# Patient Record
Sex: Female | Born: 1975 | Race: White | Hispanic: No | Marital: Married | Smoking: Never smoker
Health system: Southern US, Community
[De-identification: ages and names within clinical notes are randomized; demographics above are authoritative.]

## PROBLEM LIST (undated history)

## (undated) DIAGNOSIS — F419 Anxiety disorder, unspecified: Secondary | ICD-10-CM

## (undated) DIAGNOSIS — F32A Depression, unspecified: Secondary | ICD-10-CM

## (undated) DIAGNOSIS — F909 Attention-deficit hyperactivity disorder, unspecified type: Secondary | ICD-10-CM

## (undated) DIAGNOSIS — F329 Major depressive disorder, single episode, unspecified: Secondary | ICD-10-CM

## (undated) DIAGNOSIS — T7840XA Allergy, unspecified, initial encounter: Secondary | ICD-10-CM

## (undated) HISTORY — DX: Depression, unspecified: F32.A

## (undated) HISTORY — PX: BREAST CYST ASPIRATION: SHX578

## (undated) HISTORY — DX: Anxiety disorder, unspecified: F41.9

## (undated) HISTORY — DX: Attention-deficit hyperactivity disorder, unspecified type: F90.9

## (undated) HISTORY — PX: FOOT SURGERY: SHX648

## (undated) HISTORY — DX: Allergy, unspecified, initial encounter: T78.40XA

## (undated) HISTORY — PX: MOUTH SURGERY: SHX715

## (undated) HISTORY — PX: TUBAL LIGATION: SHX77

## (undated) HISTORY — PX: ENDOSCOPIC PLANTAR FASCIOTOMY: SUR443

---
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
## 2007-05-30 ENCOUNTER — Ambulatory Visit: Payer: Self-pay | Admitting: Nurse Practitioner

## 2010-05-02 ENCOUNTER — Ambulatory Visit: Payer: Self-pay | Admitting: Obstetrics and Gynecology

## 2010-05-06 ENCOUNTER — Ambulatory Visit: Payer: Self-pay | Admitting: Obstetrics and Gynecology

## 2010-07-27 ENCOUNTER — Ambulatory Visit: Payer: Self-pay | Admitting: Nurse Practitioner

## 2011-12-05 ENCOUNTER — Encounter: Payer: Self-pay | Admitting: Orthopedic Surgery

## 2011-12-15 ENCOUNTER — Encounter: Payer: Self-pay | Admitting: Orthopedic Surgery

## 2012-01-12 ENCOUNTER — Encounter: Payer: Self-pay | Admitting: Orthopedic Surgery

## 2012-04-16 DIAGNOSIS — Z0000 Encounter for general adult medical examination without abnormal findings: Secondary | ICD-10-CM | POA: Insufficient documentation

## 2012-07-18 ENCOUNTER — Encounter: Payer: Self-pay | Admitting: Internal Medicine

## 2012-07-24 ENCOUNTER — Ambulatory Visit: Payer: Self-pay | Admitting: Internal Medicine

## 2012-08-13 ENCOUNTER — Encounter: Payer: Self-pay | Admitting: Internal Medicine

## 2012-09-13 ENCOUNTER — Encounter: Payer: Self-pay | Admitting: Internal Medicine

## 2014-07-18 DIAGNOSIS — F419 Anxiety disorder, unspecified: Secondary | ICD-10-CM

## 2014-07-18 DIAGNOSIS — F32A Depression, unspecified: Secondary | ICD-10-CM | POA: Insufficient documentation

## 2014-07-18 DIAGNOSIS — F329 Major depressive disorder, single episode, unspecified: Secondary | ICD-10-CM | POA: Insufficient documentation

## 2014-07-18 DIAGNOSIS — K5909 Other constipation: Secondary | ICD-10-CM | POA: Insufficient documentation

## 2014-07-22 DIAGNOSIS — M549 Dorsalgia, unspecified: Secondary | ICD-10-CM | POA: Insufficient documentation

## 2014-07-22 DIAGNOSIS — G4700 Insomnia, unspecified: Secondary | ICD-10-CM | POA: Insufficient documentation

## 2014-08-20 ENCOUNTER — Ambulatory Visit: Payer: Self-pay | Admitting: Internal Medicine

## 2014-08-31 ENCOUNTER — Ambulatory Visit (INDEPENDENT_AMBULATORY_CARE_PROVIDER_SITE_OTHER): Payer: 59

## 2014-08-31 ENCOUNTER — Encounter: Payer: Self-pay | Admitting: Podiatry

## 2014-08-31 ENCOUNTER — Ambulatory Visit (INDEPENDENT_AMBULATORY_CARE_PROVIDER_SITE_OTHER): Payer: 59 | Admitting: Podiatry

## 2014-08-31 VITALS — BP 114/80 | HR 101 | Resp 16 | Ht 64.0 in | Wt 180.0 lb

## 2014-08-31 DIAGNOSIS — J309 Allergic rhinitis, unspecified: Secondary | ICD-10-CM | POA: Insufficient documentation

## 2014-08-31 DIAGNOSIS — F458 Other somatoform disorders: Secondary | ICD-10-CM | POA: Insufficient documentation

## 2014-08-31 DIAGNOSIS — S93602A Unspecified sprain of left foot, initial encounter: Secondary | ICD-10-CM

## 2014-08-31 DIAGNOSIS — G43909 Migraine, unspecified, not intractable, without status migrainosus: Secondary | ICD-10-CM | POA: Insufficient documentation

## 2014-08-31 DIAGNOSIS — G5782 Other specified mononeuropathies of left lower limb: Secondary | ICD-10-CM

## 2014-08-31 DIAGNOSIS — G5762 Lesion of plantar nerve, left lower limb: Secondary | ICD-10-CM

## 2014-08-31 DIAGNOSIS — N942 Vaginismus: Secondary | ICD-10-CM | POA: Insufficient documentation

## 2014-08-31 NOTE — Progress Notes (Signed)
   Subjective:    Patient ID: Sara Suarez, female    DOB: 1976-03-19, 38 y.o.   MRN: [id number]  HPI Comments: The fourth of July was coming down one step and the foot rolled and since has been hurting. The top of the foot hurts. Walking, standing and baring weight hurts. Sharp stabbing pain. Was using ice , sometimes take ibuprofen.   Foot Injury  Associated symptoms include numbness.      Review of Systems  HENT:       Sinus problems   Gastrointestinal: Positive for constipation.  Musculoskeletal:       Muscle pain   Allergic/Immunologic: Positive for environmental allergies and food allergies.  Neurological: Positive for numbness and headaches.  Psychiatric/Behavioral: The patient is nervous/anxious.   All other systems reviewed and are negative.      Objective:   Physical Exam: I have reviewed her past medical history medications allergies surgeries social history assistant. Pulses are strongly palpable bilateral. Neurologic sensorium is intact per since once the monofilament however she does have a palpable Mulder's click to the third interdigital space of the left foot. She also demonstrates a diastases the toe third and fourth digits of the left foot. Radiating pain on palpation the subtalar muscle strength is 5 over 5 dorsiflexors plantar flexors inverters everters all musculature is intact. Tendon reflexes are intact and symmetrical bilateral. Orthopedic evaluation demonstrates a normal osseous architecture bilateral.        Assessment & Plan:  Assessment: Neuroma third interdigital space left foot.  Plan: Discussed etiology pathology conservative versus surgical therapies. Injected the third interdigital space of the left foot today with Kenalog and local anesthetic discussed appropriate shoe gear and no followup with her in one month if necessary.

## 2014-09-01 ENCOUNTER — Ambulatory Visit: Payer: Self-pay | Admitting: Internal Medicine

## 2014-10-12 ENCOUNTER — Ambulatory Visit: Payer: 59 | Admitting: Podiatry

## 2015-03-30 ENCOUNTER — Encounter: Payer: Self-pay | Admitting: Podiatry

## 2015-03-30 ENCOUNTER — Ambulatory Visit (INDEPENDENT_AMBULATORY_CARE_PROVIDER_SITE_OTHER): Payer: 59 | Admitting: Podiatry

## 2015-03-30 VITALS — BP 116/76 | HR 83 | Resp 16

## 2015-03-30 DIAGNOSIS — G5762 Lesion of plantar nerve, left lower limb: Secondary | ICD-10-CM | POA: Diagnosis not present

## 2015-03-30 DIAGNOSIS — G5782 Other specified mononeuropathies of left lower limb: Secondary | ICD-10-CM

## 2015-03-30 NOTE — Progress Notes (Signed)
She presents today with chief complaint of a painful neuroma third interdigital space of her left foot. She states that it's been bothering me for the past few days particularly after dancing for long periods of time in high heels. Otherwise she states that he was doing very good since October of last year.  Objective: Vital signs are stable she is alert and oriented 3. Pulses are strongly palpable left foot. She has a palpable Mulder's click to the third interdigital space of the left foot indicative of neuroma.  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  Plan: Injected the left third interdigital space today with Kenalog and local anesthetic we discussed pros and cons of surgical options. I will follow-up with her as needed.

## 2015-09-13 ENCOUNTER — Ambulatory Visit (INDEPENDENT_AMBULATORY_CARE_PROVIDER_SITE_OTHER): Payer: 59

## 2015-09-13 ENCOUNTER — Encounter: Payer: Self-pay | Admitting: Podiatry

## 2015-09-13 ENCOUNTER — Ambulatory Visit (INDEPENDENT_AMBULATORY_CARE_PROVIDER_SITE_OTHER): Payer: 59 | Admitting: Podiatry

## 2015-09-13 VITALS — BP 107/77 | HR 90 | Resp 16

## 2015-09-13 DIAGNOSIS — G5762 Lesion of plantar nerve, left lower limb: Secondary | ICD-10-CM | POA: Diagnosis not present

## 2015-09-13 DIAGNOSIS — M779 Enthesopathy, unspecified: Secondary | ICD-10-CM

## 2015-09-13 DIAGNOSIS — G5782 Other specified mononeuropathies of left lower limb: Secondary | ICD-10-CM

## 2015-09-13 NOTE — Progress Notes (Signed)
She presents today for follow-up of her neuroma third interdigital space left foot. She states that as of late she has started to develop some sharp stabbing pain on range of motion that is intermittent about the first metatarsophalangeal joint. She denies trauma other than possibly injuring it while dancing. She has redeveloped symptoms to the third interdigital space of the left foot.  Objective: Vital signs are stable she is alert and oriented 3. Pulses are palpable. Neurologic sensorium is intact bilateral with a palpable Mulder's click to the third interdigital space of the left foot. She has tenderness and crepitation on range of motion of the first metatarsophalangeal joint of the right foot. She does however have full range of motion of the first metatarsophalangeal joint. Radiographs demonstrate some joint space narrowing with what also appears to be an older, healed fracture intra-articular base of the proximal phalanx centrally located. Minimally displaced no comminution was present. Cutaneous evaluation is unremarkable.  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 This is possibly associated with an old fracture.  Plan: Discussed etiology pathology conservative versus surgical therapies. I injected her first dose of dehydrated alcohol to the third interdigital space of the left foot. I also injected after sterile Betadine skin prep, 2 mg of dexamethasone with local anesthetic to the first metatarsophalangeal joint of the right foot. She tolerated this procedure well and I will follow-up with her in 3 weeks for her second dose of dehydrated alcohol to the third interdigital space left foot.  Sara Suarez DPM

## 2015-10-13 ENCOUNTER — Ambulatory Visit (INDEPENDENT_AMBULATORY_CARE_PROVIDER_SITE_OTHER): Payer: 59 | Admitting: Podiatry

## 2015-10-13 ENCOUNTER — Encounter: Payer: Self-pay | Admitting: Podiatry

## 2015-10-13 VITALS — Wt 170.6 lb

## 2015-10-13 DIAGNOSIS — G5762 Lesion of plantar nerve, left lower limb: Secondary | ICD-10-CM | POA: Diagnosis not present

## 2015-10-13 DIAGNOSIS — M779 Enthesopathy, unspecified: Secondary | ICD-10-CM | POA: Diagnosis not present

## 2015-10-13 DIAGNOSIS — G5782 Other specified mononeuropathies of left lower limb: Secondary | ICD-10-CM

## 2015-10-13 NOTE — Progress Notes (Signed)
She presents today for follow-up of neuroma to her third interdigital space left foot and capsulitis first metatarsophalangeal joint right foot secondary to an old fracture of the base of the proximal phalanx. She states that the left foot is doing much better however the first metatarsophalangeal joint is not doing a lot better.  Objective: Vital signs stable alert and oriented 3. Pulses are palpable. Minimal tenderness on palpation third interdigital space of the left foot pulses remain palpable bilateral. She has pain on range of motion of the palpation of the first metatarsophalangeal joint right foot.  Assessment: Resolving neuroma third interdigital space left foot capsulitis secondary to trauma right foot.  Plan: Injected Kenalog after sterile Betadine skin prep 20 mg was injected with local anesthetic to the first metatarsophalangeal joint. I will follow-up with her in approximately 6 weeks.

## 2015-11-24 ENCOUNTER — Ambulatory Visit: Payer: 59 | Admitting: Podiatry

## 2015-12-08 ENCOUNTER — Encounter: Payer: Self-pay | Admitting: Podiatry

## 2015-12-08 ENCOUNTER — Ambulatory Visit (INDEPENDENT_AMBULATORY_CARE_PROVIDER_SITE_OTHER): Payer: Managed Care, Other (non HMO) | Admitting: Podiatry

## 2015-12-08 VITALS — BP 118/74 | HR 75 | Resp 16

## 2015-12-08 DIAGNOSIS — G5762 Lesion of plantar nerve, left lower limb: Secondary | ICD-10-CM

## 2015-12-08 DIAGNOSIS — G5782 Other specified mononeuropathies of left lower limb: Secondary | ICD-10-CM

## 2015-12-08 NOTE — Progress Notes (Signed)
She presents today chief complaint continued pain overlying the third interdigital space left foot. She states that I would like to continue my out all injections.  Objective: Low signs are stable she is alert and oriented 3 after reviewing chart is obvious that she following up for neuroma third interdigital space of the left foot. Pulses are strongly palpable. Palpable Mulder's click third interdigital space left with diastases between third and fourth toes.  Assessment: Neuroma third interdigital space left. 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  Plan: His cast etiology pathology conservative versus surgical therapies we discussed surgical therapy regarding the right foot today however I suggested that she follow up with me in the near future for some medical consult regarding the first metatarsophalangeal joint. The left foot was injected today and third interdigital space with her third dose of dehydrated alcohol. Follow-up with her in 3 weeks

## 2016-01-03 ENCOUNTER — Ambulatory Visit (INDEPENDENT_AMBULATORY_CARE_PROVIDER_SITE_OTHER): Payer: Managed Care, Other (non HMO) | Admitting: Podiatry

## 2016-01-03 ENCOUNTER — Encounter: Payer: Self-pay | Admitting: Podiatry

## 2016-01-03 DIAGNOSIS — G5762 Lesion of plantar nerve, left lower limb: Secondary | ICD-10-CM | POA: Diagnosis not present

## 2016-01-03 DIAGNOSIS — M779 Enthesopathy, unspecified: Secondary | ICD-10-CM

## 2016-01-03 DIAGNOSIS — G5782 Other specified mononeuropathies of left lower limb: Secondary | ICD-10-CM

## 2016-01-03 NOTE — Progress Notes (Signed)
She presents today for her fourth alcohol injection to the third interdigital space of the left foot. She states that it was good for a day or 2 and then it started hurting again. She is also concerned about first metatarsophalangeal joint of the right foot.  Objective: Vital signs are stable alert and oriented 3. Pulses are palpable. Palpable Mulder's click third interdigital space of the left foot. Pain on range of motion first metatarsophalangeal joint right foot.  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  Plan: Injected her fourth toes a dehydrated alcohol to the third interdigital space of the left foot today and will follow-up with her in 3 weeks and consider an injection to the contralateral first metatarsophalangeal joint.

## 2016-01-26 ENCOUNTER — Ambulatory Visit (INDEPENDENT_AMBULATORY_CARE_PROVIDER_SITE_OTHER): Payer: Managed Care, Other (non HMO) | Admitting: Podiatry

## 2016-01-26 ENCOUNTER — Encounter: Payer: Self-pay | Admitting: Podiatry

## 2016-01-26 VITALS — BP 106/73 | HR 86 | Resp 16

## 2016-01-26 DIAGNOSIS — M779 Enthesopathy, unspecified: Secondary | ICD-10-CM

## 2016-01-26 DIAGNOSIS — G5762 Lesion of plantar nerve, left lower limb: Secondary | ICD-10-CM

## 2016-01-26 DIAGNOSIS — G5782 Other specified mononeuropathies of left lower limb: Secondary | ICD-10-CM

## 2016-01-26 NOTE — Progress Notes (Signed)
She presents today 3 weeks status post injection fourth dose of dehydrated alcohol third interdigital space of the left foot. She states that it is much improved but is still sore. She continues to dance which aggravates not only her hips but her first metatarsophalangeal joint which we have treated before as well as her neuroma third interdigital space left foot.  Objective: Vital signs are stable she is alert and oriented 3 pulses are palpable. Palpable Mulder's click to the third interdigital space of the left foot. No open lesions or wounds are noted. She has pain on palpation and range of motion of the first metatarsophalangeal joint of the right foot.  Assessment: Capsulitis osteoarthritic changes first metatarsophalangeal joint of the right foot. Also neuroma third interdigital space left foot.  Plan: Injected the fifth dose of dehydrated alcohol third interdigital space left foot. Injected dexamethasone 2 mg and local anesthetic after sterile Betadine skin prep first metatarsophalangeal joint right foot. Follow up with her in 3 weeks.

## 2016-01-27 ENCOUNTER — Telehealth: Payer: Self-pay | Admitting: *Deleted

## 2016-01-27 NOTE — Telephone Encounter (Signed)
"  I need to look at dates to have surgery on my right big toe to have bone spurs taken out of it.  You can reach me on my private line at work.  Work phone is a better way to reach me during the day.

## 2016-01-27 NOTE — Telephone Encounter (Signed)
I'm returning your call.  You want to schedule surgery?  Have you signed consent forms?  "I haven't signed anything yet.  I'm scheduled for a follow-up appointment on April 4th.  I will sign consent forms then.  I spoke with Levada Dy and she added time to my appointment to do this.  I'd like to do it May 26."  I can put you down tentatively for that date.  I don't know what procedures he may be doing.  "I have a bone spur on a toe on one foot and a Neuroma on the other foot that he's been giving me injections in."  I'll put you down tentatively for that date.

## 2016-02-16 ENCOUNTER — Encounter: Payer: Self-pay | Admitting: Podiatry

## 2016-02-16 ENCOUNTER — Ambulatory Visit (INDEPENDENT_AMBULATORY_CARE_PROVIDER_SITE_OTHER): Payer: Managed Care, Other (non HMO) | Admitting: Podiatry

## 2016-02-16 VITALS — BP 107/75 | HR 75 | Resp 16

## 2016-02-16 DIAGNOSIS — M779 Enthesopathy, unspecified: Secondary | ICD-10-CM | POA: Diagnosis not present

## 2016-02-16 DIAGNOSIS — G5782 Other specified mononeuropathies of left lower limb: Secondary | ICD-10-CM

## 2016-02-16 DIAGNOSIS — G5762 Lesion of plantar nerve, left lower limb: Secondary | ICD-10-CM | POA: Diagnosis not present

## 2016-02-16 NOTE — Patient Instructions (Signed)
Pre-Operative Instructions  Congratulations, you have decided to take an important step to improving your quality of life.  You can be assured that the doctors of [facility] will be with you every step of the way.  1. Plan to be at the surgery center/hospital at least 1 (one) hour prior to your scheduled time unless otherwise directed by the surgical center/hospital staff.  You must have a responsible adult accompany you, remain during the surgery and drive you home.  Make sure you have directions to the surgical center/hospital and know how to get there on time. 2. For hospital based surgery you will need to obtain a history and physical form from your family physician within 1 month prior to the date of surgery- we will give you a form for you primary physician.  3. We make every effort to accommodate the date you request for surgery.  There are however, times where surgery dates or times have to be moved.  We will contact you as soon as possible if a change in schedule is required.   4. No Aspirin/Ibuprofen for one week before surgery.  If you are on aspirin, any non-steroidal anti-inflammatory medications (Mobic, Aleve, Ibuprofen) you should stop taking it 7 days prior to your surgery.  You make take Tylenol  For pain prior to surgery.  5. Medications- If you are taking daily heart and blood pressure medications, seizure, reflux, allergy, asthma, anxiety, pain or diabetes medications, make sure the surgery center/hospital is aware before the day of surgery so they may notify you which medications to take or avoid the day of surgery. 6. No food or drink after midnight the night before surgery unless directed otherwise by surgical center/hospital staff. 7. No alcoholic beverages 24 hours prior to surgery.  No smoking 24 hours prior to or 24 hours after surgery. 8. Wear loose pants or shorts- loose enough to fit over bandages, boots, and casts. 9. No slip on shoes, sneakers are best. 10. Bring  your boot with you to the surgery center/hospital.  Also bring crutches or a walker if your physician has prescribed it for you.  If you do not have this equipment, it will be provided for you after surgery. 11. If you have not been contracted by the surgery center/hospital by the day before your surgery, call to confirm the date and time of your surgery. 12. Leave-time from work may vary depending on the type of surgery you have.  Appropriate arrangements should be made prior to surgery with your employer. 13. Prescriptions will be provided immediately following surgery by your doctor.  Have these filled as soon as possible after surgery and take the medication as directed. 14. Remove nail polish on the operative foot. 15. Wash the night before surgery.  The night before surgery wash the foot and leg well with the antibacterial soap provided and water paying special attention to beneath the toenails and in between the toes.  Rinse thoroughly with water and dry well with a towel.  Perform this wash unless told not to do so by your physician.  Enclosed: 1 Ice pack (please put in freezer the night before surgery)   1 Hibiclens skin cleaner   Pre-op Instructions  If you have any questions regarding the instructions, do not hesitate to call our office.  [place]: [address] [place], [place] [address] [phone]  [place]: [address] , [place] [address] [phone]  [place]: [address]  [place], [place] [address] [phone]  Dr. Richard   Tuchman DPM, Dr. Norman Regal DPM Dr. Richard Sikora DPM, Dr. M. Todd Hyatt DPM, Dr. Kathryn Egerton DPM 

## 2016-02-16 NOTE — Progress Notes (Signed)
She presents today for surgical consult regarding her bilateral foot. She states the right foot is her main concern was and ever increasing bunion deformity and cracking and popping and grinding within the first metatarsophalangeal joint where she had broken the proximal phalanx at one point in time. She states it is becoming more and more painful as time goes on and is harder for her to do the things she likes slight dancing and wearing regular shoe gear. She's also here for follow-up of her neuroma third interdigital space of the left foot. She is concerned about surgery regarding this nerve. She would like to consider surgical intervention however she is concerned that recurrence is a great issue. She denies any changes in her past medical history medications allergies surgery social history. At this point she denies cardiac disease hypertension clot development and inability to heal.  Objective: Vital signs are stable she is alert and oriented 3. Pulses are palpable. She has limited range of motion with pain dorsiflexion and plantarflexion of the first metatarsophalangeal joint right foot. Review of radiographs taken in October didn't demonstrate osteoarthritic changes of the first metatarsophalangeal joint consistent with degenerative joint disease. Joint space narrowing some chondral sclerosis etc. She does have neuroma third interspace of the left foot which we have been treating with dehydrated alcohol. It is much less tender today on palpation than previously noted.  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 Neuroma third interspace left foot.  Plan: We discussed the etiology pathology conservative versus surgical therapies today I injected the third interdigital space today with another dose of dehydrated alcohol. We discussed the surgical consent today consisting of a Cleaster Corin Zwick osteotomy with screw fixation. We also discussed  the possibility of a Keller arthroplasty with a single silicone implant. She is all for this if necessary. She's not sure whether or not she will still have a neuroma excision or not at this point we will go ahead and consent her for that and should she request we not do that she can cancel at the time of surgery. She will bring her Cam Walker with her to surgery. We did discuss the possible postoperative medications which may include but are not limited to postop pain bleeding swelling infection recurrence and need for further surgery overcorrection under correction loss of digit loss of limb loss of life. I answered all of these questions to best of my ability in layman's terms and she signed all 3 patient form. We dispensed a pamphlet for brace for specialty surgical Center and the anesthesia group. We will follow up with her in the near future for surgery.

## 2016-02-17 ENCOUNTER — Telehealth: Payer: Self-pay | Admitting: *Deleted

## 2016-02-17 NOTE — Telephone Encounter (Signed)
"  I was given your card yesterday when I signed my consent forms.  Dr. Milinda Pointer said you can schedule me."

## 2016-02-18 NOTE — Telephone Encounter (Signed)
I'm returning your call.  "Yes, I'd like to schedule my surgery.  I saw Dr. Milinda Pointer on Wednesday.  He said to call you to schedule."  When would you like to do it?  "I'd like to do it the Friday before Memorial Day, that way I'll have an extra day that I won't have to ask for time off."  We had you tentatively scheduled for that day.  We'll get you scheduled.  Go ahead and register with the surgical center.  They will call yo a day or two prior to surgery date with the arrival time.  "I glad you mentioned it, I'll get that out of my bag and register."

## 2016-03-20 ENCOUNTER — Ambulatory Visit (INDEPENDENT_AMBULATORY_CARE_PROVIDER_SITE_OTHER): Payer: Managed Care, Other (non HMO) | Admitting: Podiatry

## 2016-03-20 ENCOUNTER — Encounter: Payer: Self-pay | Admitting: Podiatry

## 2016-03-20 VITALS — BP 101/61 | HR 88 | Resp 16

## 2016-03-20 DIAGNOSIS — G5762 Lesion of plantar nerve, left lower limb: Secondary | ICD-10-CM | POA: Diagnosis not present

## 2016-03-20 DIAGNOSIS — G5782 Other specified mononeuropathies of left lower limb: Secondary | ICD-10-CM

## 2016-03-20 DIAGNOSIS — M779 Enthesopathy, unspecified: Secondary | ICD-10-CM

## 2016-03-20 NOTE — Progress Notes (Signed)
She presents today with chief complaint of painful neuroma third interdigital space of her left foot. She would like to have one last alcohol injection to the left foot prior to surgery to make sure that this needs to have surgery performed.  Objective: Vital signs are stable she is alert and oriented 3. Pulses are palpable. Palpable Mulder's click to the third interdigital space of the left foot.  Assessment: Pain in limb secondary to neuroma.  Plan: Injected her third interdigital space left foot with her seventh dose of dehydrated alcohol. Follow-up with her in 2 weeks for surgical intervention if it is still warranted.

## 2016-04-06 ENCOUNTER — Other Ambulatory Visit: Payer: Self-pay | Admitting: Podiatry

## 2016-04-06 MED ORDER — CEPHALEXIN 500 MG PO CAPS: 500.0000 mg | ORAL_CAPSULE | Freq: Three times a day (TID) | ORAL | Status: DC

## 2016-04-06 MED ORDER — OXYCODONE-ACETAMINOPHEN 10-325 MG PO TABS: ORAL_TABLET | ORAL | Status: DC

## 2016-04-06 MED ORDER — ONDANSETRON HCL 4 MG PO TABS: 4.0000 mg | ORAL_TABLET | Freq: Three times a day (TID) | ORAL | Status: DC | PRN

## 2016-04-07 ENCOUNTER — Encounter: Payer: Self-pay | Admitting: Podiatry

## 2016-04-07 DIAGNOSIS — G5760 Lesion of plantar nerve, unspecified lower limb: Secondary | ICD-10-CM | POA: Diagnosis not present

## 2016-04-07 DIAGNOSIS — M2011 Hallux valgus (acquired), right foot: Secondary | ICD-10-CM | POA: Diagnosis not present

## 2016-04-12 ENCOUNTER — Ambulatory Visit (INDEPENDENT_AMBULATORY_CARE_PROVIDER_SITE_OTHER): Payer: Managed Care, Other (non HMO) | Admitting: Podiatry

## 2016-04-12 ENCOUNTER — Ambulatory Visit (INDEPENDENT_AMBULATORY_CARE_PROVIDER_SITE_OTHER): Payer: Managed Care, Other (non HMO)

## 2016-04-12 ENCOUNTER — Encounter: Payer: Self-pay | Admitting: Podiatry

## 2016-04-12 DIAGNOSIS — Z9889 Other specified postprocedural states: Secondary | ICD-10-CM

## 2016-04-12 DIAGNOSIS — M205X1 Other deformities of toe(s) (acquired), right foot: Secondary | ICD-10-CM

## 2016-04-12 DIAGNOSIS — D3610 Benign neoplasm of peripheral nerves and autonomic nervous system, unspecified: Secondary | ICD-10-CM | POA: Diagnosis not present

## 2016-04-12 NOTE — Progress Notes (Signed)
She presents today for follow-up of her first metatarsophalangeal joint and the first metatarsal osteotomy 1 week. He states that it seems to be doing pretty well she ambulates normally today utilizing a cam walker. She denies fever chills nausea vomiting muscle aches and pains denies chest pain shortness of breath. She denies calf pain.  Objective: Vital signs are stable she is alert and oriented 3. Dry sterile dressing was removed demonstrates no erythema is mild edema some ecchymosis no cellulitis drainage or odor. She has good range of motion of the first metatarsophalangeal joint without much tenderness. She has a very nice reduction of the first metatarsal osteotomy shortening in nature with screw fixation right foot. Sutures are intact margins are well coapted.  Assessment: Healing surgical foot right.  Plan: Redressed today dresser compressive dressing encouraged range of motion exercises continue to keep this dry and elevated will follow up with her in 1 week for suture removal.

## 2016-04-19 ENCOUNTER — Encounter: Payer: Self-pay | Admitting: Podiatry

## 2016-04-19 ENCOUNTER — Ambulatory Visit (INDEPENDENT_AMBULATORY_CARE_PROVIDER_SITE_OTHER): Payer: Managed Care, Other (non HMO) | Admitting: Podiatry

## 2016-04-19 DIAGNOSIS — Z9889 Other specified postprocedural states: Secondary | ICD-10-CM

## 2016-04-19 DIAGNOSIS — M205X1 Other deformities of toe(s) (acquired), right foot: Secondary | ICD-10-CM

## 2016-04-19 DIAGNOSIS — D3610 Benign neoplasm of peripheral nerves and autonomic nervous system, unspecified: Secondary | ICD-10-CM | POA: Diagnosis not present

## 2016-04-19 NOTE — Progress Notes (Signed)
She presents today 2 weeks status post [facility] bunion repair right foot. She states that it seems to be tight but other than that is doing well. She states that she had to take the dressing off and she does were regular soccer. She states that the dressing seemed to irritate the wound.  Objective: Vital signs are stable she's alert and oriented 3 there is some edema to the right foot since he has not had compression on it more than likely the sensation that she is currently feeling is due to the swelling. She is good range of motion of the first metatarsophalangeal joint. Sutures are intact as well coapted we removed the sutures today as he no signs of infection along the incision site.  Assessment: Well-healed surgical foot.  Plan: I redressed with a compression anklet today and placed her in a Darco shoe I will follow-up with her in 2 weeks. We will reevaluate the neuroma to the left foot at that time.

## 2016-05-03 ENCOUNTER — Ambulatory Visit (INDEPENDENT_AMBULATORY_CARE_PROVIDER_SITE_OTHER): Payer: Managed Care, Other (non HMO)

## 2016-05-03 ENCOUNTER — Encounter: Payer: Self-pay | Admitting: Podiatry

## 2016-05-03 ENCOUNTER — Ambulatory Visit (INDEPENDENT_AMBULATORY_CARE_PROVIDER_SITE_OTHER): Payer: Managed Care, Other (non HMO) | Admitting: Podiatry

## 2016-05-03 DIAGNOSIS — M205X1 Other deformities of toe(s) (acquired), right foot: Secondary | ICD-10-CM

## 2016-05-03 DIAGNOSIS — Z9889 Other specified postprocedural states: Secondary | ICD-10-CM

## 2016-05-03 DIAGNOSIS — G5782 Other specified mononeuropathies of left lower limb: Secondary | ICD-10-CM

## 2016-05-03 DIAGNOSIS — G5762 Lesion of plantar nerve, left lower limb: Secondary | ICD-10-CM

## 2016-05-03 MED ORDER — NAFTIFINE HCL 2 % EX GEL: 1.0000 "application " | Freq: Two times a day (BID) | CUTANEOUS | Status: DC

## 2016-05-03 NOTE — Progress Notes (Signed)
She presents today one month status post [place] bunion repair with screw fixation right foot. She states that she is doing very well. She states that she would like to have another dehydrated alcohol injection to the third digit left foot. She states that 2 weeks ago she decided to go back to work on her own without requesting a note and her foot was sterilely painful. She states that she remain out of work for the next 2-3 days and allow her foot to recuperate. She states now she has been working all this week and she feels pretty good. She has not been wearing the compression anklet at all times but she has been wearing her Darco shoe.  Objective: Vital signs are stable she is alert and oriented 3. Pulses are palpable. Neurologic sensorium is intact. She has great range of motion of the first metatarsophalangeal joint with very little edema no erythema saline as drainage or odor. The radiographs confirm well-placed I osteotomy and screw fixation. Palpable CLICK third interdigital space left foot. She also has interdigital tinea pedis bilateral.  Assessment: Well-healing Austin bunion repair with screw right foot. Much improved neuroma third interdigital space of the left foot. Tinea pedis.  Plan: Prescribed Naftin gel 2% to be applied twice daily for 2 weeks. I also encouraged her get back into a regular pair of shoes which we discussed in great detail today. I also reinjected her third interdigital space of her left lower dehydrated alcohol no will follow up with her in 1 month. She will call with questions or concerns. I will allow her to go back to tae kwon do with her Cam Gilford Rile just to do upper body work.

## 2016-06-05 ENCOUNTER — Ambulatory Visit (INDEPENDENT_AMBULATORY_CARE_PROVIDER_SITE_OTHER): Payer: Managed Care, Other (non HMO)

## 2016-06-05 ENCOUNTER — Encounter: Payer: Self-pay | Admitting: Podiatry

## 2016-06-05 ENCOUNTER — Ambulatory Visit (INDEPENDENT_AMBULATORY_CARE_PROVIDER_SITE_OTHER): Payer: Managed Care, Other (non HMO) | Admitting: Podiatry

## 2016-06-05 DIAGNOSIS — G5761 Lesion of plantar nerve, right lower limb: Secondary | ICD-10-CM | POA: Diagnosis not present

## 2016-06-05 DIAGNOSIS — Z9889 Other specified postprocedural states: Secondary | ICD-10-CM

## 2016-06-05 DIAGNOSIS — M205X1 Other deformities of toe(s) (acquired), right foot: Secondary | ICD-10-CM

## 2016-06-05 MED ORDER — DICLOFENAC SODIUM 1 % TD GEL: 4.0000 g | Freq: Four times a day (QID) | TRANSDERMAL | 4 refills | Status: DC

## 2016-06-05 NOTE — Progress Notes (Signed)
She presents today for follow-up of her Liane Comber bunion repair right foot date of surgery 04/07/2016 states that is doing just great. She states that her neuroma is doing great as well and less on the contralateral foot.  Objective: Vital signs are stable she is alert and oriented 3 she still has some allodynic type pain to the dorsal aspect of the right foot. Her radiographs confirm well-healing surgical foot right with complete osteotomy intact.  Assessment: Well-healing osteotomy first metatarsal right foot.  Plan: I encouraged her to utilize diclofenac gel which I prescribed for her today. And I will get her back to her regular routine as soon as possible. Follow-up with me in 6 weeks.

## 2016-06-09 ENCOUNTER — Telehealth: Payer: Self-pay | Admitting: *Deleted

## 2016-06-09 NOTE — Telephone Encounter (Addendum)
[employer] RECEIVED DENYING COVERAGE OF DICLOFENAC GEL. 06/13/2016- Dr. Milinda Pointer ordered [place] to [place].

## 2016-06-12 NOTE — Telephone Encounter (Signed)
Yes that will be fine.  Thank you

## 2016-06-13 MED ORDER — NONFORMULARY OR COMPOUNDED ITEM: 11 refills | Status: DC

## 2016-07-10 ENCOUNTER — Ambulatory Visit (INDEPENDENT_AMBULATORY_CARE_PROVIDER_SITE_OTHER): Payer: Managed Care, Other (non HMO)

## 2016-07-10 ENCOUNTER — Encounter: Payer: Self-pay | Admitting: Podiatry

## 2016-07-10 ENCOUNTER — Ambulatory Visit (INDEPENDENT_AMBULATORY_CARE_PROVIDER_SITE_OTHER): Payer: Managed Care, Other (non HMO) | Admitting: Podiatry

## 2016-07-10 DIAGNOSIS — Z9889 Other specified postprocedural states: Secondary | ICD-10-CM

## 2016-07-10 DIAGNOSIS — M205X1 Other deformities of toe(s) (acquired), right foot: Secondary | ICD-10-CM

## 2016-07-10 NOTE — Progress Notes (Signed)
She presents today for her final postop visit date of surgery 04/07/2016 status post [place]tazar Apo with osteotomy right foot. She states that it seems to be doing okay but he just will not bend back as far as I needed to.  Objective: Vital signs are stable she is alert and oriented 3. Pulses are strongly palpable. No erythema and no edema saline as drainage or odor. This foot has gone on to heal 100% with 100% full range of motion with dorsiflexion and plantar flexion. Radiographs demonstrate well-healed osteotomy no signs of broken bones or displacement of internal fixation.  Assessment: Well-healing surgical foot right.  Plan: Follow up with me on an as-needed basis.

## 2016-07-13 NOTE — Progress Notes (Signed)
DOS 04/07/2016 Austin-Youngwick Osteotomy with screw rt foot, Neurectomy 3rd interdigital space left foot.

## 2016-08-01 ENCOUNTER — Other Ambulatory Visit: Payer: Self-pay | Admitting: Internal Medicine

## 2016-08-01 DIAGNOSIS — Z803 Family history of malignant neoplasm of breast: Secondary | ICD-10-CM | POA: Insufficient documentation

## 2016-08-01 DIAGNOSIS — R251 Tremor, unspecified: Secondary | ICD-10-CM | POA: Insufficient documentation

## 2016-08-01 DIAGNOSIS — Z1231 Encounter for screening mammogram for malignant neoplasm of breast: Secondary | ICD-10-CM

## 2016-08-18 ENCOUNTER — Other Ambulatory Visit: Payer: Self-pay | Admitting: Internal Medicine

## 2016-08-18 ENCOUNTER — Ambulatory Visit
Admission: RE | Admit: 2016-08-18 | Discharge: 2016-08-18 | Disposition: A | Discharge status: Home / Self Care | Payer: Managed Care, Other (non HMO) | Source: Ambulatory Visit | Attending: Internal Medicine | Admitting: Internal Medicine

## 2016-08-18 DIAGNOSIS — Z1231 Encounter for screening mammogram for malignant neoplasm of breast: Secondary | ICD-10-CM | POA: Insufficient documentation

## 2017-02-19 ENCOUNTER — Ambulatory Visit: Payer: Managed Care, Other (non HMO) | Admitting: Podiatry

## 2017-02-28 ENCOUNTER — Ambulatory Visit: Payer: Managed Care, Other (non HMO) | Admitting: Podiatry

## 2017-03-05 ENCOUNTER — Ambulatory Visit (INDEPENDENT_AMBULATORY_CARE_PROVIDER_SITE_OTHER): Payer: Managed Care, Other (non HMO) | Admitting: Podiatry

## 2017-03-05 ENCOUNTER — Encounter: Payer: Self-pay | Admitting: Podiatry

## 2017-03-05 DIAGNOSIS — G5761 Lesion of plantar nerve, right lower limb: Secondary | ICD-10-CM | POA: Diagnosis not present

## 2017-03-05 NOTE — Progress Notes (Signed)
She presents today states that they have another neuroma on the right foot this time as she refers to the second interdigital space of the right foot.  Objective: She has pain on palpation second interdigital space right foot. Radiating pain palpable Mulder's click.  Assessment: Neuroma second interdigital space right foot.  Plan: Injected today with first and only dose of cortisone. Follow up with her in 3 weeks if necessary.

## 2017-03-26 ENCOUNTER — Ambulatory Visit: Payer: Managed Care, Other (non HMO) | Admitting: Podiatry

## 2017-08-01 ENCOUNTER — Other Ambulatory Visit: Payer: Self-pay | Admitting: Internal Medicine

## 2017-08-01 DIAGNOSIS — Z803 Family history of malignant neoplasm of breast: Secondary | ICD-10-CM

## 2017-08-01 DIAGNOSIS — J4520 Mild intermittent asthma, uncomplicated: Secondary | ICD-10-CM | POA: Insufficient documentation

## 2017-08-01 DIAGNOSIS — Z1231 Encounter for screening mammogram for malignant neoplasm of breast: Secondary | ICD-10-CM

## 2017-08-22 ENCOUNTER — Ambulatory Visit
Admission: RE | Admit: 2017-08-22 | Discharge: 2017-08-22 | Disposition: A | Discharge status: Home / Self Care | Payer: Managed Care, Other (non HMO) | Source: Ambulatory Visit | Attending: Internal Medicine | Admitting: Internal Medicine

## 2017-08-22 DIAGNOSIS — Z803 Family history of malignant neoplasm of breast: Secondary | ICD-10-CM

## 2017-08-22 DIAGNOSIS — Z1231 Encounter for screening mammogram for malignant neoplasm of breast: Secondary | ICD-10-CM

## 2017-09-18 ENCOUNTER — Ambulatory Visit: Payer: Managed Care, Other (non HMO) | Admitting: Podiatry

## 2017-09-18 ENCOUNTER — Ambulatory Visit: Payer: Managed Care, Other (non HMO)

## 2017-09-18 ENCOUNTER — Ambulatory Visit (INDEPENDENT_AMBULATORY_CARE_PROVIDER_SITE_OTHER): Payer: Managed Care, Other (non HMO)

## 2017-09-18 ENCOUNTER — Encounter: Payer: Self-pay | Admitting: Podiatry

## 2017-09-18 DIAGNOSIS — D3610 Benign neoplasm of peripheral nerves and autonomic nervous system, unspecified: Secondary | ICD-10-CM

## 2017-09-18 DIAGNOSIS — G5761 Lesion of plantar nerve, right lower limb: Secondary | ICD-10-CM

## 2017-09-18 NOTE — Progress Notes (Signed)
She presents today for follow-up of her wart neuroma to her right foot. She states that she's having some pain in her left foot around the knuckles. She still states that she continues type Sara Suarez and she's not sure what is the Effexor and tae kwon do pain wise versus a possible fracture.  Objective: Vital signs are stable alert and oriented 3 no reproducible pain on either foot. Pulses are strongly palpable. Radiographs taken today demonstrate no major osseous abnormalities screw was retained to the head of first metatarsal right foot.  Assessment well-healing neuroma third interspace right foot.  Plan: Follow up with her on an as-needed basis.

## 2017-11-22 DIAGNOSIS — J301 Allergic rhinitis due to pollen: Secondary | ICD-10-CM | POA: Diagnosis not present

## 2017-11-22 DIAGNOSIS — J3081 Allergic rhinitis due to animal (cat) (dog) hair and dander: Secondary | ICD-10-CM | POA: Diagnosis not present

## 2017-11-22 DIAGNOSIS — J3089 Other allergic rhinitis: Secondary | ICD-10-CM | POA: Diagnosis not present

## 2017-11-29 DIAGNOSIS — J3081 Allergic rhinitis due to animal (cat) (dog) hair and dander: Secondary | ICD-10-CM | POA: Diagnosis not present

## 2017-11-29 DIAGNOSIS — J3089 Other allergic rhinitis: Secondary | ICD-10-CM | POA: Diagnosis not present

## 2017-11-29 DIAGNOSIS — J301 Allergic rhinitis due to pollen: Secondary | ICD-10-CM | POA: Diagnosis not present

## 2017-12-06 DIAGNOSIS — J301 Allergic rhinitis due to pollen: Secondary | ICD-10-CM | POA: Diagnosis not present

## 2017-12-06 DIAGNOSIS — J3089 Other allergic rhinitis: Secondary | ICD-10-CM | POA: Diagnosis not present

## 2017-12-06 DIAGNOSIS — J3081 Allergic rhinitis due to animal (cat) (dog) hair and dander: Secondary | ICD-10-CM | POA: Diagnosis not present

## 2017-12-12 DIAGNOSIS — J3089 Other allergic rhinitis: Secondary | ICD-10-CM | POA: Diagnosis not present

## 2017-12-12 DIAGNOSIS — J301 Allergic rhinitis due to pollen: Secondary | ICD-10-CM | POA: Diagnosis not present

## 2017-12-12 DIAGNOSIS — J3081 Allergic rhinitis due to animal (cat) (dog) hair and dander: Secondary | ICD-10-CM | POA: Diagnosis not present

## 2017-12-18 DIAGNOSIS — J301 Allergic rhinitis due to pollen: Secondary | ICD-10-CM | POA: Diagnosis not present

## 2017-12-18 DIAGNOSIS — Z79899 Other long term (current) drug therapy: Secondary | ICD-10-CM | POA: Diagnosis not present

## 2017-12-18 DIAGNOSIS — J3089 Other allergic rhinitis: Secondary | ICD-10-CM | POA: Diagnosis not present

## 2017-12-18 DIAGNOSIS — J3081 Allergic rhinitis due to animal (cat) (dog) hair and dander: Secondary | ICD-10-CM | POA: Diagnosis not present

## 2017-12-28 DIAGNOSIS — J3081 Allergic rhinitis due to animal (cat) (dog) hair and dander: Secondary | ICD-10-CM | POA: Diagnosis not present

## 2017-12-28 DIAGNOSIS — J301 Allergic rhinitis due to pollen: Secondary | ICD-10-CM | POA: Diagnosis not present

## 2017-12-28 DIAGNOSIS — J3089 Other allergic rhinitis: Secondary | ICD-10-CM | POA: Diagnosis not present

## 2018-01-03 DIAGNOSIS — J301 Allergic rhinitis due to pollen: Secondary | ICD-10-CM | POA: Diagnosis not present

## 2018-01-03 DIAGNOSIS — J3089 Other allergic rhinitis: Secondary | ICD-10-CM | POA: Diagnosis not present

## 2018-01-03 DIAGNOSIS — J3081 Allergic rhinitis due to animal (cat) (dog) hair and dander: Secondary | ICD-10-CM | POA: Diagnosis not present

## 2018-01-04 ENCOUNTER — Ambulatory Visit: Payer: Managed Care, Other (non HMO) | Admitting: Podiatry

## 2018-01-04 ENCOUNTER — Ambulatory Visit: Payer: 59 | Admitting: Podiatry

## 2018-01-04 ENCOUNTER — Encounter: Payer: Self-pay | Admitting: Podiatry

## 2018-01-04 DIAGNOSIS — G5761 Lesion of plantar nerve, right lower limb: Secondary | ICD-10-CM

## 2018-01-07 NOTE — Progress Notes (Signed)
   HPI: 42 year old female presenting today for symptoms of a neuroma flare-up of the right foot. She reports associated numbness, tingling and a nodule to the 2nd and 3rd metatarsals of the foot. Walking, standing and bearing weight increase the pain. She has been icing the area and taking Ibuprofen with minimal relief. Patient is here for further evaluation and treatment.   Past Medical History:  Diagnosis Date  . Allergy   . Anxiety      Physical Exam: General: The patient is alert and oriented x3 in no acute distress.  Dermatology: Skin is warm, dry and supple bilateral lower extremities. Negative for open lesions or macerations.  Vascular: Palpable pedal pulses bilaterally. No edema or erythema noted. Capillary refill within normal limits.  Neurological: Epicritic and protective threshold grossly intact bilaterally.   Musculoskeletal Exam: Sharp pain with palpation of the 2nd interspace and lateral compression of the metatarsal heads consistent with neuroma.  Positive Conley Canal sign with loadbearing of the forefoot.  Assessment: 1. 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 H/o multiple foot surgeries bilateral   Plan of Care:  1. Patient was evaluated. 2. Injection of 0.5 mLs Celestone Soluspan injected into the neuroma of the right foot.  3. Continue wearing good shoe gear. 4. Continue taking OTC Motrin.  5. Return to clinic as needed.   Takes Taekwondo   Edrick Kins, DPM [facility]  Dr. Edrick Kins, [address] [place]                                        [place], [place] [address]                Office [phone]  Fax [phone]

## 2018-01-21 DIAGNOSIS — J3089 Other allergic rhinitis: Secondary | ICD-10-CM | POA: Diagnosis not present

## 2018-01-21 DIAGNOSIS — J3081 Allergic rhinitis due to animal (cat) (dog) hair and dander: Secondary | ICD-10-CM | POA: Diagnosis not present

## 2018-01-21 DIAGNOSIS — J301 Allergic rhinitis due to pollen: Secondary | ICD-10-CM | POA: Diagnosis not present

## 2018-01-31 DIAGNOSIS — J301 Allergic rhinitis due to pollen: Secondary | ICD-10-CM | POA: Diagnosis not present

## 2018-01-31 DIAGNOSIS — J3089 Other allergic rhinitis: Secondary | ICD-10-CM | POA: Diagnosis not present

## 2018-01-31 DIAGNOSIS — J3081 Allergic rhinitis due to animal (cat) (dog) hair and dander: Secondary | ICD-10-CM | POA: Diagnosis not present

## 2018-02-07 DIAGNOSIS — J301 Allergic rhinitis due to pollen: Secondary | ICD-10-CM | POA: Diagnosis not present

## 2018-02-07 DIAGNOSIS — J3089 Other allergic rhinitis: Secondary | ICD-10-CM | POA: Diagnosis not present

## 2018-02-07 DIAGNOSIS — J3081 Allergic rhinitis due to animal (cat) (dog) hair and dander: Secondary | ICD-10-CM | POA: Diagnosis not present

## 2018-02-13 DIAGNOSIS — J3089 Other allergic rhinitis: Secondary | ICD-10-CM | POA: Diagnosis not present

## 2018-02-13 DIAGNOSIS — J3081 Allergic rhinitis due to animal (cat) (dog) hair and dander: Secondary | ICD-10-CM | POA: Diagnosis not present

## 2018-02-13 DIAGNOSIS — J301 Allergic rhinitis due to pollen: Secondary | ICD-10-CM | POA: Diagnosis not present

## 2018-02-21 DIAGNOSIS — J301 Allergic rhinitis due to pollen: Secondary | ICD-10-CM | POA: Diagnosis not present

## 2018-02-21 DIAGNOSIS — J3081 Allergic rhinitis due to animal (cat) (dog) hair and dander: Secondary | ICD-10-CM | POA: Diagnosis not present

## 2018-02-21 DIAGNOSIS — J3089 Other allergic rhinitis: Secondary | ICD-10-CM | POA: Diagnosis not present

## 2018-02-26 DIAGNOSIS — J301 Allergic rhinitis due to pollen: Secondary | ICD-10-CM | POA: Diagnosis not present

## 2018-02-26 DIAGNOSIS — J3081 Allergic rhinitis due to animal (cat) (dog) hair and dander: Secondary | ICD-10-CM | POA: Diagnosis not present

## 2018-02-26 DIAGNOSIS — J3089 Other allergic rhinitis: Secondary | ICD-10-CM | POA: Diagnosis not present

## 2018-03-06 ENCOUNTER — Encounter: Payer: Self-pay | Admitting: Podiatry

## 2018-03-06 ENCOUNTER — Ambulatory Visit: Payer: 59 | Admitting: Podiatry

## 2018-03-06 DIAGNOSIS — G5791 Unspecified mononeuropathy of right lower limb: Secondary | ICD-10-CM

## 2018-03-06 DIAGNOSIS — G5761 Lesion of plantar nerve, right lower limb: Secondary | ICD-10-CM | POA: Diagnosis not present

## 2018-03-07 ENCOUNTER — Ambulatory Visit (INDEPENDENT_AMBULATORY_CARE_PROVIDER_SITE_OTHER): Payer: 59 | Admitting: Orthotics

## 2018-03-07 DIAGNOSIS — G5791 Unspecified mononeuropathy of right lower limb: Secondary | ICD-10-CM | POA: Diagnosis not present

## 2018-03-07 DIAGNOSIS — J301 Allergic rhinitis due to pollen: Secondary | ICD-10-CM | POA: Diagnosis not present

## 2018-03-07 DIAGNOSIS — M205X1 Other deformities of toe(s) (acquired), right foot: Secondary | ICD-10-CM | POA: Diagnosis not present

## 2018-03-07 DIAGNOSIS — J3089 Other allergic rhinitis: Secondary | ICD-10-CM | POA: Diagnosis not present

## 2018-03-07 DIAGNOSIS — D3610 Benign neoplasm of peripheral nerves and autonomic nervous system, unspecified: Secondary | ICD-10-CM

## 2018-03-07 DIAGNOSIS — G5761 Lesion of plantar nerve, right lower limb: Secondary | ICD-10-CM

## 2018-03-07 DIAGNOSIS — J3081 Allergic rhinitis due to animal (cat) (dog) hair and dander: Secondary | ICD-10-CM | POA: Diagnosis not present

## 2018-03-07 NOTE — Progress Notes (Signed)
Patient here today for scanning for CMFO per dr. Amalia Hailey.   Patient presents with hx of neuromas and currently being treated for neuroma 3rd/4th interspace RIGHT; also painful keratomas.   Ordering f/o with good arch support and offloading callus area; also w/ a neurma pad.

## 2018-03-08 DIAGNOSIS — J3089 Other allergic rhinitis: Secondary | ICD-10-CM | POA: Diagnosis not present

## 2018-03-10 NOTE — Progress Notes (Signed)
   HPI: 42 year old female presenting today for symptoms of a neuroma flare-up of the right foot. She is requesting an injection and states that has helped alleviate the pain in the past. There are no modifying factors noted. She has not done anything for treatment at home. Patient is here for further evaluation and treatment.   Past Medical History:  Diagnosis Date  . Allergy   . Anxiety      Physical Exam: General: The patient is alert and oriented x3 in no acute distress.  Dermatology: Skin is warm, dry and supple bilateral lower extremities. Negative for open lesions or macerations.  Vascular: Palpable pedal pulses bilaterally. No edema or erythema noted. Capillary refill within normal limits.  Neurological: Epicritic and protective threshold grossly intact bilaterally.   Musculoskeletal Exam: Sharp pain with palpation of the 2nd interspace and lateral compression of the metatarsal heads consistent with neuroma.  Positive Conley Canal sign with loadbearing of the forefoot.  Assessment: 1. 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 H/o multiple foot surgeries bilateral   Plan of Care:  1. Patient was evaluated. 2. Injection of 0.5 mLs Celestone Soluspan injected into the neuroma of the right foot.  3. Appointment with Liliane Channel for custom molded orthotics.  4. Plantar fascial braces dispensed bilaterally.  5. Return to clinic as needed.   Takes Taekwondo   Edrick Kins, DPM [facility]  Dr. Edrick Kins, [address]                                        [place], [place] [address]                Office [phone]  Fax [phone]

## 2018-03-11 DIAGNOSIS — J4520 Mild intermittent asthma, uncomplicated: Secondary | ICD-10-CM | POA: Diagnosis not present

## 2018-03-11 DIAGNOSIS — J301 Allergic rhinitis due to pollen: Secondary | ICD-10-CM | POA: Diagnosis not present

## 2018-03-11 DIAGNOSIS — J3089 Other allergic rhinitis: Secondary | ICD-10-CM | POA: Diagnosis not present

## 2018-03-11 DIAGNOSIS — H1045 Other chronic allergic conjunctivitis: Secondary | ICD-10-CM | POA: Diagnosis not present

## 2018-03-13 DIAGNOSIS — J3089 Other allergic rhinitis: Secondary | ICD-10-CM | POA: Diagnosis not present

## 2018-03-13 DIAGNOSIS — J301 Allergic rhinitis due to pollen: Secondary | ICD-10-CM | POA: Diagnosis not present

## 2018-03-13 DIAGNOSIS — J3081 Allergic rhinitis due to animal (cat) (dog) hair and dander: Secondary | ICD-10-CM | POA: Diagnosis not present

## 2018-03-18 DIAGNOSIS — Z79899 Other long term (current) drug therapy: Secondary | ICD-10-CM | POA: Diagnosis not present

## 2018-03-20 DIAGNOSIS — J3081 Allergic rhinitis due to animal (cat) (dog) hair and dander: Secondary | ICD-10-CM | POA: Diagnosis not present

## 2018-03-20 DIAGNOSIS — J301 Allergic rhinitis due to pollen: Secondary | ICD-10-CM | POA: Diagnosis not present

## 2018-03-22 DIAGNOSIS — J3081 Allergic rhinitis due to animal (cat) (dog) hair and dander: Secondary | ICD-10-CM | POA: Diagnosis not present

## 2018-03-22 DIAGNOSIS — J3089 Other allergic rhinitis: Secondary | ICD-10-CM | POA: Diagnosis not present

## 2018-03-22 DIAGNOSIS — J301 Allergic rhinitis due to pollen: Secondary | ICD-10-CM | POA: Diagnosis not present

## 2018-03-26 DIAGNOSIS — J301 Allergic rhinitis due to pollen: Secondary | ICD-10-CM | POA: Diagnosis not present

## 2018-03-26 DIAGNOSIS — J3081 Allergic rhinitis due to animal (cat) (dog) hair and dander: Secondary | ICD-10-CM | POA: Diagnosis not present

## 2018-03-26 DIAGNOSIS — J3089 Other allergic rhinitis: Secondary | ICD-10-CM | POA: Diagnosis not present

## 2018-03-29 DIAGNOSIS — J3081 Allergic rhinitis due to animal (cat) (dog) hair and dander: Secondary | ICD-10-CM | POA: Diagnosis not present

## 2018-03-29 DIAGNOSIS — J301 Allergic rhinitis due to pollen: Secondary | ICD-10-CM | POA: Diagnosis not present

## 2018-03-29 DIAGNOSIS — J3089 Other allergic rhinitis: Secondary | ICD-10-CM | POA: Diagnosis not present

## 2018-04-02 ENCOUNTER — Telehealth: Payer: Self-pay | Admitting: Podiatry

## 2018-04-02 NOTE — Telephone Encounter (Signed)
Orthotics in..pt to call to schedule an appt to pick them up.Marland KitchenMarland Kitchen

## 2018-04-11 ENCOUNTER — Ambulatory Visit: Payer: 59 | Admitting: Orthotics

## 2018-04-11 DIAGNOSIS — D3610 Benign neoplasm of peripheral nerves and autonomic nervous system, unspecified: Secondary | ICD-10-CM

## 2018-04-11 DIAGNOSIS — G5791 Unspecified mononeuropathy of right lower limb: Secondary | ICD-10-CM

## 2018-04-11 NOTE — Progress Notes (Signed)
Patient came in today to pick up custom made foot orthotics.  The goals were accomplished and the patient reported no dissatisfaction with said orthotics.  Patient was advised of breakin period and how to report any issues.  Topcover NOT glued completely on so patient can fine tune the placement of the neuroma pad.  Patient left w/o paying anything toward 398 (didn't check out)  We need to get this before she leaves when she comes back to get topcover glued.

## 2018-04-15 DIAGNOSIS — J301 Allergic rhinitis due to pollen: Secondary | ICD-10-CM | POA: Diagnosis not present

## 2018-04-15 DIAGNOSIS — J3081 Allergic rhinitis due to animal (cat) (dog) hair and dander: Secondary | ICD-10-CM | POA: Diagnosis not present

## 2018-04-15 DIAGNOSIS — J3089 Other allergic rhinitis: Secondary | ICD-10-CM | POA: Diagnosis not present

## 2018-04-16 DIAGNOSIS — M25531 Pain in right wrist: Secondary | ICD-10-CM | POA: Diagnosis not present

## 2018-04-19 DIAGNOSIS — J3081 Allergic rhinitis due to animal (cat) (dog) hair and dander: Secondary | ICD-10-CM | POA: Diagnosis not present

## 2018-04-19 DIAGNOSIS — J301 Allergic rhinitis due to pollen: Secondary | ICD-10-CM | POA: Diagnosis not present

## 2018-04-19 DIAGNOSIS — J3089 Other allergic rhinitis: Secondary | ICD-10-CM | POA: Diagnosis not present

## 2018-05-02 DIAGNOSIS — J3081 Allergic rhinitis due to animal (cat) (dog) hair and dander: Secondary | ICD-10-CM | POA: Diagnosis not present

## 2018-05-02 DIAGNOSIS — J301 Allergic rhinitis due to pollen: Secondary | ICD-10-CM | POA: Diagnosis not present

## 2018-05-02 DIAGNOSIS — J3089 Other allergic rhinitis: Secondary | ICD-10-CM | POA: Diagnosis not present

## 2018-05-07 DIAGNOSIS — J3089 Other allergic rhinitis: Secondary | ICD-10-CM | POA: Diagnosis not present

## 2018-05-07 DIAGNOSIS — J3081 Allergic rhinitis due to animal (cat) (dog) hair and dander: Secondary | ICD-10-CM | POA: Diagnosis not present

## 2018-05-07 DIAGNOSIS — J301 Allergic rhinitis due to pollen: Secondary | ICD-10-CM | POA: Diagnosis not present

## 2018-05-09 DIAGNOSIS — M25531 Pain in right wrist: Secondary | ICD-10-CM | POA: Diagnosis not present

## 2018-05-10 DIAGNOSIS — J301 Allergic rhinitis due to pollen: Secondary | ICD-10-CM | POA: Diagnosis not present

## 2018-05-10 DIAGNOSIS — J3089 Other allergic rhinitis: Secondary | ICD-10-CM | POA: Diagnosis not present

## 2018-05-10 DIAGNOSIS — J3081 Allergic rhinitis due to animal (cat) (dog) hair and dander: Secondary | ICD-10-CM | POA: Diagnosis not present

## 2018-05-23 DIAGNOSIS — J3089 Other allergic rhinitis: Secondary | ICD-10-CM | POA: Diagnosis not present

## 2018-05-23 DIAGNOSIS — J301 Allergic rhinitis due to pollen: Secondary | ICD-10-CM | POA: Diagnosis not present

## 2018-05-23 DIAGNOSIS — J3081 Allergic rhinitis due to animal (cat) (dog) hair and dander: Secondary | ICD-10-CM | POA: Diagnosis not present

## 2018-05-30 ENCOUNTER — Ambulatory Visit: Payer: 59 | Admitting: Podiatry

## 2018-05-30 ENCOUNTER — Encounter: Payer: Self-pay | Admitting: Podiatry

## 2018-05-30 DIAGNOSIS — G5761 Lesion of plantar nerve, right lower limb: Secondary | ICD-10-CM | POA: Diagnosis not present

## 2018-05-31 DIAGNOSIS — J301 Allergic rhinitis due to pollen: Secondary | ICD-10-CM | POA: Diagnosis not present

## 2018-05-31 DIAGNOSIS — J3081 Allergic rhinitis due to animal (cat) (dog) hair and dander: Secondary | ICD-10-CM | POA: Diagnosis not present

## 2018-05-31 DIAGNOSIS — J3089 Other allergic rhinitis: Secondary | ICD-10-CM | POA: Diagnosis not present

## 2018-06-01 NOTE — Progress Notes (Signed)
She presents today for follow-up neuroma.  She states that is been doing good since April when Dr. Amalia Hailey injected it however is starting to bother me once again.  It seems to be bothering me now in the second metatarsal area.  Objective: Vital signs are stable alert and oriented x3.  Palpable Mulder's click to the second interdigital space of the right foot.  Pulses are palpable.  Assessment: Neuroma right second interdigital space.  Plan: After sterile Betadine skin prep I injected 20 mg Kenalog 5 mg Marcaine to the second interdigital space of the right foot.  Follow-up with her in 1 month

## 2018-06-13 DIAGNOSIS — J3089 Other allergic rhinitis: Secondary | ICD-10-CM | POA: Diagnosis not present

## 2018-06-13 DIAGNOSIS — J301 Allergic rhinitis due to pollen: Secondary | ICD-10-CM | POA: Diagnosis not present

## 2018-06-13 DIAGNOSIS — J3081 Allergic rhinitis due to animal (cat) (dog) hair and dander: Secondary | ICD-10-CM | POA: Diagnosis not present

## 2018-06-20 DIAGNOSIS — J3089 Other allergic rhinitis: Secondary | ICD-10-CM | POA: Diagnosis not present

## 2018-06-20 DIAGNOSIS — Z79899 Other long term (current) drug therapy: Secondary | ICD-10-CM | POA: Diagnosis not present

## 2018-06-20 DIAGNOSIS — J301 Allergic rhinitis due to pollen: Secondary | ICD-10-CM | POA: Diagnosis not present

## 2018-06-20 DIAGNOSIS — J3081 Allergic rhinitis due to animal (cat) (dog) hair and dander: Secondary | ICD-10-CM | POA: Diagnosis not present

## 2018-06-28 DIAGNOSIS — J3081 Allergic rhinitis due to animal (cat) (dog) hair and dander: Secondary | ICD-10-CM | POA: Diagnosis not present

## 2018-06-28 DIAGNOSIS — J3089 Other allergic rhinitis: Secondary | ICD-10-CM | POA: Diagnosis not present

## 2018-06-28 DIAGNOSIS — J301 Allergic rhinitis due to pollen: Secondary | ICD-10-CM | POA: Diagnosis not present

## 2018-07-05 DIAGNOSIS — J301 Allergic rhinitis due to pollen: Secondary | ICD-10-CM | POA: Diagnosis not present

## 2018-07-05 DIAGNOSIS — J3089 Other allergic rhinitis: Secondary | ICD-10-CM | POA: Diagnosis not present

## 2018-07-05 DIAGNOSIS — J3081 Allergic rhinitis due to animal (cat) (dog) hair and dander: Secondary | ICD-10-CM | POA: Diagnosis not present

## 2018-07-09 DIAGNOSIS — J3081 Allergic rhinitis due to animal (cat) (dog) hair and dander: Secondary | ICD-10-CM | POA: Diagnosis not present

## 2018-07-09 DIAGNOSIS — J3089 Other allergic rhinitis: Secondary | ICD-10-CM | POA: Diagnosis not present

## 2018-07-09 DIAGNOSIS — J301 Allergic rhinitis due to pollen: Secondary | ICD-10-CM | POA: Diagnosis not present

## 2018-07-11 ENCOUNTER — Ambulatory Visit: Payer: 59 | Admitting: Podiatry

## 2018-07-11 ENCOUNTER — Encounter: Payer: Self-pay | Admitting: Podiatry

## 2018-07-11 DIAGNOSIS — G5761 Lesion of plantar nerve, right lower limb: Secondary | ICD-10-CM

## 2018-07-11 NOTE — Progress Notes (Signed)
She presents today for follow-up of neuroma second interdigital space right foot.  She states that she is ready for the neural lysis.  Objective: Vital signs are stable alert and oriented x3.  Pulses are palpable.  She has pain on palpation to the second interdigital space with palpable Mulder's click consistent with neuroma.  Her graph assessment: Neuroma second interspace right foot.  Assessment: Neuroma second interspace right.  Plan after sterile Betadine skin prep I injected 2 cc of 4% dehydrated alcohol with local anesthetic to the second interspace of the right foot.  Follow-up with her in 4 weeks

## 2018-07-23 DIAGNOSIS — J3081 Allergic rhinitis due to animal (cat) (dog) hair and dander: Secondary | ICD-10-CM | POA: Diagnosis not present

## 2018-07-23 DIAGNOSIS — J3089 Other allergic rhinitis: Secondary | ICD-10-CM | POA: Diagnosis not present

## 2018-07-23 DIAGNOSIS — J301 Allergic rhinitis due to pollen: Secondary | ICD-10-CM | POA: Diagnosis not present

## 2018-08-06 DIAGNOSIS — J3081 Allergic rhinitis due to animal (cat) (dog) hair and dander: Secondary | ICD-10-CM | POA: Diagnosis not present

## 2018-08-06 DIAGNOSIS — J3089 Other allergic rhinitis: Secondary | ICD-10-CM | POA: Diagnosis not present

## 2018-08-06 DIAGNOSIS — J301 Allergic rhinitis due to pollen: Secondary | ICD-10-CM | POA: Diagnosis not present

## 2018-08-07 DIAGNOSIS — G43909 Migraine, unspecified, not intractable, without status migrainosus: Secondary | ICD-10-CM | POA: Diagnosis not present

## 2018-08-07 DIAGNOSIS — Z0000 Encounter for general adult medical examination without abnormal findings: Secondary | ICD-10-CM | POA: Diagnosis not present

## 2018-08-07 DIAGNOSIS — R6889 Other general symptoms and signs: Secondary | ICD-10-CM | POA: Diagnosis not present

## 2018-08-08 ENCOUNTER — Ambulatory Visit: Payer: 59 | Admitting: Podiatry

## 2018-08-08 ENCOUNTER — Encounter: Payer: Self-pay | Admitting: Podiatry

## 2018-08-08 DIAGNOSIS — G5762 Lesion of plantar nerve, left lower limb: Secondary | ICD-10-CM | POA: Diagnosis not present

## 2018-08-08 NOTE — Progress Notes (Signed)
She presents today for follow-up of her second interdigital space ganglion to her right foot.  States that is been sore recently but it seemed to have started to work for the first few days.  Objective: Vital signs are stable she is alert and oriented x3 still has a palpable Mulder's click to the second interdigital space of the right foot.  Pulses remain palpable.  No erythema edema cellulitis drainage or odor.  Assessment: Neuroma  second interdigital space of the right foot.  Plan: Injected dehydrated alcohol a total of 2 cc after sterile Betadine skin prep to the second interdigital space of the right foot.  Follow-up with her in 3 to 4 weeks.

## 2018-08-16 ENCOUNTER — Other Ambulatory Visit: Payer: Self-pay | Admitting: Internal Medicine

## 2018-08-16 DIAGNOSIS — Z1231 Encounter for screening mammogram for malignant neoplasm of breast: Secondary | ICD-10-CM

## 2018-08-25 DIAGNOSIS — Z23 Encounter for immunization: Secondary | ICD-10-CM | POA: Diagnosis not present

## 2018-09-02 ENCOUNTER — Ambulatory Visit: Payer: 59 | Admitting: Podiatry

## 2018-09-10 ENCOUNTER — Ambulatory Visit
Admission: RE | Admit: 2018-09-10 | Discharge: 2018-09-10 | Disposition: A | Discharge status: Home / Self Care | Payer: 59 | Source: Ambulatory Visit | Attending: Internal Medicine | Admitting: Internal Medicine

## 2018-09-10 DIAGNOSIS — J301 Allergic rhinitis due to pollen: Secondary | ICD-10-CM | POA: Diagnosis not present

## 2018-09-10 DIAGNOSIS — Z1231 Encounter for screening mammogram for malignant neoplasm of breast: Secondary | ICD-10-CM | POA: Insufficient documentation

## 2018-09-10 DIAGNOSIS — J3089 Other allergic rhinitis: Secondary | ICD-10-CM | POA: Diagnosis not present

## 2018-09-10 DIAGNOSIS — J3081 Allergic rhinitis due to animal (cat) (dog) hair and dander: Secondary | ICD-10-CM | POA: Diagnosis not present

## 2018-09-12 ENCOUNTER — Encounter: Payer: Self-pay | Admitting: Podiatry

## 2018-09-12 ENCOUNTER — Ambulatory Visit (INDEPENDENT_AMBULATORY_CARE_PROVIDER_SITE_OTHER): Payer: 59 | Admitting: Podiatry

## 2018-09-12 DIAGNOSIS — G5762 Lesion of plantar nerve, left lower limb: Secondary | ICD-10-CM

## 2018-09-12 NOTE — Progress Notes (Signed)
She presents today for follow-up of neuroma to the second interdigital space of the right foot.  Today will be her fourth injection to the second interdigital space right foot.  Objective: Vital signs are stable alert and oriented x3.  Pulses are palpable.  Palpable Mulder's click second interdigital space right foot with pain.  Assessment: Neuroma slowly resolving right.  Plan: After sterile Betadine skin prep I injected 2 cc of 4% dehydrated alcohol to the second interdigital space of the right foot.  Tolerated procedure well follow-up with her in a couple of 3 weeks.

## 2018-09-16 DIAGNOSIS — J3089 Other allergic rhinitis: Secondary | ICD-10-CM | POA: Diagnosis not present

## 2018-09-16 DIAGNOSIS — J301 Allergic rhinitis due to pollen: Secondary | ICD-10-CM | POA: Diagnosis not present

## 2018-09-16 DIAGNOSIS — J3081 Allergic rhinitis due to animal (cat) (dog) hair and dander: Secondary | ICD-10-CM | POA: Diagnosis not present

## 2018-09-23 DIAGNOSIS — Z79899 Other long term (current) drug therapy: Secondary | ICD-10-CM | POA: Diagnosis not present

## 2018-09-25 DIAGNOSIS — J301 Allergic rhinitis due to pollen: Secondary | ICD-10-CM | POA: Diagnosis not present

## 2018-09-25 DIAGNOSIS — J3081 Allergic rhinitis due to animal (cat) (dog) hair and dander: Secondary | ICD-10-CM | POA: Diagnosis not present

## 2018-09-26 DIAGNOSIS — J3089 Other allergic rhinitis: Secondary | ICD-10-CM | POA: Diagnosis not present

## 2018-10-03 ENCOUNTER — Ambulatory Visit: Payer: 59 | Admitting: Podiatry

## 2018-10-03 ENCOUNTER — Encounter: Payer: Self-pay | Admitting: Podiatry

## 2018-10-03 DIAGNOSIS — G5762 Lesion of plantar nerve, left lower limb: Secondary | ICD-10-CM | POA: Diagnosis not present

## 2018-10-03 DIAGNOSIS — J301 Allergic rhinitis due to pollen: Secondary | ICD-10-CM | POA: Diagnosis not present

## 2018-10-03 DIAGNOSIS — J3081 Allergic rhinitis due to animal (cat) (dog) hair and dander: Secondary | ICD-10-CM | POA: Diagnosis not present

## 2018-10-03 DIAGNOSIS — J3089 Other allergic rhinitis: Secondary | ICD-10-CM | POA: Diagnosis not present

## 2018-10-03 MED ORDER — NONFORMULARY OR COMPOUNDED ITEM: 3 refills | Status: DC

## 2018-10-03 NOTE — Progress Notes (Signed)
She presents today for follow-up of her painful neuroma second interspace of the right foot states that really has not changed much from last visit.  The toes are very sensitive.  Objective: Vital signs stable she is alert and oriented x3 still has pain on palpation to the second interdigital space of the right foot.  The skin is very sensitive over the toes.  Assessment: Chronic neuroma second interspace right.  Allodynic pain.  Plan: At this point we are going to have a compounded agent for pain made and sent to her.  Also went ahead and reinjected the second interdigital space today with 2 cc of 4% dehydrated alcohol and local anesthetic.  Tolerated procedure well without comp occasions.

## 2018-10-24 ENCOUNTER — Encounter: Payer: Self-pay | Admitting: Podiatry

## 2018-10-24 ENCOUNTER — Ambulatory Visit: Payer: 59 | Admitting: Podiatry

## 2018-10-24 DIAGNOSIS — J3081 Allergic rhinitis due to animal (cat) (dog) hair and dander: Secondary | ICD-10-CM | POA: Diagnosis not present

## 2018-10-24 DIAGNOSIS — J3089 Other allergic rhinitis: Secondary | ICD-10-CM | POA: Diagnosis not present

## 2018-10-24 DIAGNOSIS — G5761 Lesion of plantar nerve, right lower limb: Secondary | ICD-10-CM | POA: Diagnosis not present

## 2018-10-24 DIAGNOSIS — J301 Allergic rhinitis due to pollen: Secondary | ICD-10-CM | POA: Diagnosis not present

## 2018-10-24 NOTE — Progress Notes (Signed)
She presents today for follow-up of neuroma second interspace of the right foot states it is better today but is been really painful and aggravated over the past few weeks.  Objective: Vital signs are stable alert and oriented x3 there is no erythema no Celestone drainage has palpable Mulder click second interdigital space of the right foot.  Assessment: Painful and limp secondary to neuroma second interspace of the right foot.  Plan: Extra Betadine skin prep I injected 2 cc 4% dehydrated alcohol with local anesthetic to the second interspace of the right foot this is the fifth injection.  We will follow-up with her in 3 to 4 weeks.

## 2018-10-31 DIAGNOSIS — J3081 Allergic rhinitis due to animal (cat) (dog) hair and dander: Secondary | ICD-10-CM | POA: Diagnosis not present

## 2018-10-31 DIAGNOSIS — J301 Allergic rhinitis due to pollen: Secondary | ICD-10-CM | POA: Diagnosis not present

## 2018-10-31 DIAGNOSIS — J3089 Other allergic rhinitis: Secondary | ICD-10-CM | POA: Diagnosis not present

## 2018-11-21 ENCOUNTER — Ambulatory Visit: Payer: 59 | Admitting: Podiatry

## 2018-11-21 ENCOUNTER — Encounter: Payer: Self-pay | Admitting: Podiatry

## 2018-11-21 DIAGNOSIS — G5761 Lesion of plantar nerve, right lower limb: Secondary | ICD-10-CM

## 2018-11-21 NOTE — Progress Notes (Signed)
She presents today for follow-up of neuroma to the second interdigital space of the right foot.  States that is not as angry as it was last week but is not any better than it was a week for that.  Objective: Vital signs are stable alert and oriented x3 palpable Mulder's click second interspace of the right foot.  Lesser degree of the third interdigital space.  Assessment: Resolving neuroma third interspace.  Plan: Injected her 6 dose of dehydrated alcohol to the second interspace of the right foot.  Tolerated seizure well without complications.  Follow-up with her in 1 month.

## 2018-11-26 DIAGNOSIS — J301 Allergic rhinitis due to pollen: Secondary | ICD-10-CM | POA: Diagnosis not present

## 2018-11-26 DIAGNOSIS — J3089 Other allergic rhinitis: Secondary | ICD-10-CM | POA: Diagnosis not present

## 2018-11-26 DIAGNOSIS — J3081 Allergic rhinitis due to animal (cat) (dog) hair and dander: Secondary | ICD-10-CM | POA: Diagnosis not present

## 2018-12-19 ENCOUNTER — Ambulatory Visit (INDEPENDENT_AMBULATORY_CARE_PROVIDER_SITE_OTHER): Payer: 59

## 2018-12-19 ENCOUNTER — Ambulatory Visit: Payer: 59 | Admitting: Podiatry

## 2018-12-19 ENCOUNTER — Encounter: Payer: Self-pay | Admitting: Podiatry

## 2018-12-19 DIAGNOSIS — S90122A Contusion of left lesser toe(s) without damage to nail, initial encounter: Secondary | ICD-10-CM

## 2018-12-19 DIAGNOSIS — G5761 Lesion of plantar nerve, right lower limb: Secondary | ICD-10-CM | POA: Diagnosis not present

## 2018-12-19 NOTE — Progress Notes (Signed)
She presents today thinks that she may have broken her third and fourth toes on the left foot.  She states that the second interdigital space was doing great for a few days and then it came right back.  Objective: Vital signs are stable she is alert and oriented x3.  Pulses are palpable.  Third and fourth toes of the left foot demonstrates no edema no erythema cellulitis drainage or odor.  Radiographs demonstrate no separation are no acute injuries to the toes.  She does have pain on palpation to the second interspace of the right foot.  Palpable Mulder's click is noted.  Assessment: Chronic neuroma second interspace right foot.  Contusion toes 3 and 4 left.  Plan: At this point after Betadine skin prep I injected 1 cc of 4% dehydrated alcohol to the point of maximal tenderness.  She tolerated procedure well without complications.

## 2019-01-14 ENCOUNTER — Ambulatory Visit: Payer: 59 | Admitting: Podiatry

## 2019-01-14 ENCOUNTER — Encounter: Payer: Self-pay | Admitting: Podiatry

## 2019-01-14 DIAGNOSIS — G5761 Lesion of plantar nerve, right lower limb: Secondary | ICD-10-CM

## 2019-01-14 NOTE — Progress Notes (Signed)
She presents today for follow-up of her neuroma second interspace of the right foot.  States that is just not doing as well as I had hoped it was very angry after the first week he was doing fine and then it just went crazy after that.  Objective: Vital signs are stable alert and oriented x3 pain on palpation to the second interdigital space of the right foot.  Assessment: Neuroma second interspace right foot.  Plan: Formed her eighth injection today dehydrated alcohol 2 cc 4% dehydrated alcohol and local anesthetic.  Tolerated procedure well we did discuss the possible need for neurectomy she understands this and is amenable to it.

## 2019-01-20 ENCOUNTER — Telehealth: Payer: Self-pay | Admitting: Podiatry

## 2019-01-20 NOTE — Telephone Encounter (Signed)
Left message informing pt that on occasion with the neurolysis, there may be a flare of the symptoms, rest, ice 3-4 times daily for 15-20 minutes protecting the skin with a light towel and use the pain cream prescribed by Dr. Milinda Pointer, and call me to discuss.

## 2019-01-20 NOTE — Telephone Encounter (Signed)
I just had my eighth neuroma injection last Tuesday. Since I can hardly walk from the pain. Please call me on my direct work number [phone] and you can leave a message if I don't answer.

## 2019-01-20 NOTE — Telephone Encounter (Signed)
Pt called and states she has been in misery the whole weekend, Dr. Milinda Pointer had said there should be an improvement after the 7th and 8th injections, but this was worse. I instructed pt to use ice therapy, antiinflammatory cream and stiff bottom shoe. Pt states she has a walking boot and can use that, but is ready for an alternate therapy. I offered to have pt come in to discuss other therapies to include possible surgery. Pt states she has an appt for 02/04/2019 for the 9th injection and will wait until then.

## 2019-01-21 NOTE — Telephone Encounter (Signed)
If she needs a narcotic we can do that as well.

## 2019-01-21 NOTE — Telephone Encounter (Signed)
I called pt for status and she states the foot does not like ice. I offered narcotic for bedtime and pt refused states she is wearing the boot and going to tough it our until her next appt. Pt states she may just say no to the 9th shot and go straight to the surgery scheduling. I told pt I would inform Dr. Milinda Pointer.

## 2019-01-22 ENCOUNTER — Telehealth: Payer: Self-pay | Admitting: *Deleted

## 2019-01-22 NOTE — Telephone Encounter (Signed)
I called pt and told her I would send to schedulers for an appt tomorrow and then to surgery coordinator to check availability for surgery next week.

## 2019-01-22 NOTE — Telephone Encounter (Signed)
Pt states she is done and ready to schedule the nerve removal surgery next week, and can come in tomorrow to sign consent forms.

## 2019-01-23 ENCOUNTER — Ambulatory Visit: Payer: 59 | Admitting: Podiatry

## 2019-01-23 ENCOUNTER — Encounter: Payer: Self-pay | Admitting: Podiatry

## 2019-01-23 ENCOUNTER — Other Ambulatory Visit: Payer: Self-pay

## 2019-01-23 DIAGNOSIS — G5761 Lesion of plantar nerve, right lower limb: Secondary | ICD-10-CM

## 2019-01-23 NOTE — Progress Notes (Signed)
She presents today chief complaint of a painful neuroma second interdigital space of the right foot.  States that the last injections have failed to relieve any of her symptoms.  At this point she would like to consider surgical intervention.  She denies any changes in her past medical history medications allergies surgery social history.  Objective: Vital signs are stable alert and oriented x3.  Palpable Mulder's click second interdigital space of the right foot with pain on palpation of this area.  Assessment: Neuroma second interspace right.  Plan: Discussed etiology pathology conservative surgical therapies at this point in time we did discuss in great detail excision of this neuroma.  She understands that she is going have numbness and tingling between the toes for the rest of her life she understands and is amenable to it.  We discussed the pros and cons of surgery discussed the possible complications which may include but not limited to postop pain bleeding swelling infection recurrence loss of digit loss of sensation loss of life.  She understands and is amenable to it.  She will bring her own cam walker with her that day we will do this under general anesthesia with a nerve block.

## 2019-01-23 NOTE — Patient Instructions (Signed)
Pre-Operative Instructions  Congratulations, you have decided to take an important step towards improving your quality of life.  You can be assured that the doctors and staff at [facility] will be with you every step of the way.  Here are some important things you should know:  1. Plan to be at the surgery center/hospital at least 1 (one) hour prior to your scheduled time, unless otherwise directed by the surgical center/hospital staff.  You must have a responsible adult accompany you, remain during the surgery and drive you home.  Make sure you have directions to the surgical center/hospital to ensure you arrive on time. 2. If you are having surgery at [facility], you will need a copy of your medical history and physical form from your family physician within one month prior to the date of surgery. We will give you a form for your primary physician to complete.  3. We make every effort to accommodate the date you request for surgery.  However, there are times where surgery dates or times have to be moved.  We will contact you as soon as possible if a change in schedule is required.   4. No aspirin/ibuprofen for one week before surgery.  If you are on aspirin, any non-steroidal anti-inflammatory medications (Mobic, Aleve, Ibuprofen) should not be taken seven (7) days prior to your surgery.  You make take Tylenol for pain prior to surgery.  5. Medications - If you are taking daily heart and blood pressure medications, seizure, reflux, allergy, asthma, anxiety, pain or diabetes medications, make sure you notify the surgery center/hospital before the day of surgery so they can tell you which medications you should take or avoid the day of surgery. 6. No food or drink after midnight the night before surgery unless directed otherwise by surgical center/hospital staff. 7. No alcoholic beverages 24-hours prior to surgery.  No smoking 24-hours prior or 24-hours after  surgery. 8. Wear loose pants or shorts. They should be loose enough to fit over bandages, boots, and casts. 9. Don't wear slip-on shoes. Sneakers are preferred. 10. Bring your boot with you to the surgery center/hospital.  Also bring crutches or a walker if your physician has prescribed it for you.  If you do not have this equipment, it will be provided for you after surgery. 11. If you have not been contacted by the surgery center/hospital by the day before your surgery, call to confirm the date and time of your surgery. 12. Leave-time from work may vary depending on the type of surgery you have.  Appropriate arrangements should be made prior to surgery with your employer. 13. Prescriptions will be provided immediately following surgery by your doctor.  Fill these as soon as possible after surgery and take the medication as directed. Pain medications will not be refilled on weekends and must be approved by the doctor. 14. Remove nail polish on the operative foot and avoid getting pedicures prior to surgery. 15. Wash the night before surgery.  The night before surgery wash the foot and leg well with water and the antibacterial soap provided. Be sure to pay special attention to beneath the toenails and in between the toes.  Wash for at least three (3) minutes. Rinse thoroughly with water and dry well with a towel.  Perform this wash unless told not to do so by your physician.  Enclosed: 1 Ice pack (please put in freezer the night before surgery)   1 Hibiclens skin cleaner     Pre-op instructions  If you have any questions regarding the instructions, please do not hesitate to call our office.  [place]: [address], [place], [place] [address] -- [phone]  [place]: [address] [place], [place] [address] -- [phone]  [place]: [address]  [place], [place] [address] -- [phone]  [place]: [address], [address], [place], [place] [address] -- [phone]  Website:  [email or web address] 

## 2019-01-27 ENCOUNTER — Telehealth: Payer: Self-pay | Admitting: *Deleted

## 2019-01-27 NOTE — Telephone Encounter (Signed)
Pt called states she is scheduled for surgery and due to the current national health problem, will her surgery be cancelled.

## 2019-01-27 NOTE — Telephone Encounter (Signed)
I'm returning your call.  You are still scheduled for surgery at this time.  If I hear anything different, I'll let you know.  "I know it will me sometime in the morning and someone will call me a day or two before and give me my time.  Is that correct?"  Yes, that is correct.  You will get a call on Wednesday or Thursday.

## 2019-01-29 ENCOUNTER — Other Ambulatory Visit: Payer: Self-pay | Admitting: Podiatry

## 2019-01-29 MED ORDER — ONDANSETRON HCL 4 MG PO TABS: 4.0000 mg | ORAL_TABLET | Freq: Three times a day (TID) | ORAL | 0 refills | Status: DC | PRN

## 2019-01-29 MED ORDER — HYDROMORPHONE HCL 4 MG PO TABS: 4.0000 mg | ORAL_TABLET | Freq: Four times a day (QID) | ORAL | 0 refills | Status: AC | PRN

## 2019-01-29 MED ORDER — CEPHALEXIN 500 MG PO CAPS: 500.0000 mg | ORAL_CAPSULE | Freq: Three times a day (TID) | ORAL | 0 refills | Status: DC

## 2019-02-03 ENCOUNTER — Telehealth: Payer: Self-pay | Admitting: *Deleted

## 2019-02-03 NOTE — Telephone Encounter (Signed)
I was scheduled to have surgery on Friday and it got canceled.  I received a reminder for tomorrow about a follow-up appointment.  Of course I know I don't need it.  Will you let me know when I am able to reschedule my surgery?"  I can reschedule you now.  Dr. Milinda Pointer can do it on April 24.  "Okay, that works for me.  I normally work in [place] but when [employer] not working, I prefer my appointments to be at [facility] location because I live in [place]."  I will get your appointments rescheduled.

## 2019-02-04 ENCOUNTER — Ambulatory Visit: Payer: 59 | Admitting: Podiatry

## 2019-02-05 ENCOUNTER — Other Ambulatory Visit: Payer: 59

## 2019-02-20 ENCOUNTER — Telehealth: Payer: Self-pay | Admitting: *Deleted

## 2019-02-20 NOTE — Telephone Encounter (Signed)
I called [facility] and asked Renee if we could leave Sara Suarez's surgery scheduled for March 07, 2019.  She said that would be fine.  She said Dr. Milinda Pointer can start at his normal time of 7 am.

## 2019-02-25 ENCOUNTER — Other Ambulatory Visit: Payer: 59

## 2019-02-25 NOTE — Telephone Encounter (Signed)
ok 

## 2019-03-06 ENCOUNTER — Other Ambulatory Visit: Payer: Self-pay | Admitting: Podiatry

## 2019-03-06 MED ORDER — HYDROMORPHONE HCL 4 MG PO TABS: 4.0000 mg | ORAL_TABLET | Freq: Four times a day (QID) | ORAL | 0 refills | Status: AC | PRN

## 2019-03-06 MED ORDER — CEPHALEXIN 500 MG PO CAPS: 500.0000 mg | ORAL_CAPSULE | Freq: Three times a day (TID) | ORAL | 0 refills | Status: DC

## 2019-03-06 MED ORDER — ONDANSETRON HCL 4 MG PO TABS: 4.0000 mg | ORAL_TABLET | Freq: Three times a day (TID) | ORAL | 0 refills | Status: DC | PRN

## 2019-03-07 ENCOUNTER — Encounter: Payer: Self-pay | Admitting: Podiatry

## 2019-03-07 DIAGNOSIS — G5761 Lesion of plantar nerve, right lower limb: Secondary | ICD-10-CM

## 2019-03-12 ENCOUNTER — Encounter: Payer: Self-pay | Admitting: Podiatry

## 2019-03-12 ENCOUNTER — Ambulatory Visit (INDEPENDENT_AMBULATORY_CARE_PROVIDER_SITE_OTHER): Payer: 59 | Admitting: Podiatry

## 2019-03-12 ENCOUNTER — Other Ambulatory Visit: Payer: Self-pay

## 2019-03-12 ENCOUNTER — Other Ambulatory Visit: Payer: 59

## 2019-03-12 ENCOUNTER — Encounter: Payer: 59 | Admitting: Podiatry

## 2019-03-12 VITALS — BP 131/91 | HR 80 | Temp 96.9°F

## 2019-03-12 DIAGNOSIS — G5761 Lesion of plantar nerve, right lower limb: Secondary | ICD-10-CM

## 2019-03-12 DIAGNOSIS — Z9889 Other specified postprocedural states: Secondary | ICD-10-CM

## 2019-03-12 NOTE — Progress Notes (Signed)
She presents today for her first postop visit date of surgery March 07, 2019 neurectomy second interdigital space right foot.  States that is doing okay pain is manageable with ibuprofen took my first pain pill yesterday.  Denies fever chills nausea vomiting muscle aches and pains.  Objective: Presents today with a cam walker.  Dressed her dressing intact was removed demonstrates no erythema cellulitis drainage or odor.  Assessment: Well-healing surgical foot.  Plan: Follow-up with her in 1 week.  Sutures may need to remain in a little longer this because of the area.

## 2019-03-13 ENCOUNTER — Telehealth: Payer: Self-pay | Admitting: *Deleted

## 2019-03-13 ENCOUNTER — Other Ambulatory Visit: Payer: 59

## 2019-03-13 NOTE — Telephone Encounter (Signed)
-----   Message from Garrel Ridgel, [place] sent at 03/12/2019  4:16 PM EDT ----- Confirmed neuroma.

## 2019-03-13 NOTE — Telephone Encounter (Signed)
Pt seen in office yesterday.

## 2019-03-19 ENCOUNTER — Encounter: Payer: Self-pay | Admitting: Podiatry

## 2019-03-19 ENCOUNTER — Ambulatory Visit (INDEPENDENT_AMBULATORY_CARE_PROVIDER_SITE_OTHER): Payer: 59 | Admitting: Podiatry

## 2019-03-19 ENCOUNTER — Other Ambulatory Visit: Payer: Self-pay

## 2019-03-19 VITALS — Temp 98.4°F

## 2019-03-19 DIAGNOSIS — Z9889 Other specified postprocedural states: Secondary | ICD-10-CM

## 2019-03-19 DIAGNOSIS — G5761 Lesion of plantar nerve, right lower limb: Secondary | ICD-10-CM

## 2019-03-19 NOTE — Progress Notes (Signed)
She presents today for second postop visit date of surgery 03/07/2019 status post neurectomy second interdigital space of the right foot.  States that is feeling better but the toes are little bit sore where they are black and blue.  Objective: Vital signs stable alert oriented x3.  There is no erythematous minimal edema no cellulitis drainage or odor to some ecchymosis along the adjacent toes to the second interdigital space.  There is no draining from the incision site however there is a small gap in the epidermis which leads me to believe that it may not be completely healed.  At this point I do not want to remove the sutures and have a open wound.  Assessment: Well-healing surgical foot neurectomy second interspace right foot.  Plan: We will leave the stitches in for another week or so and I redressed the foot today follow-up with her in a week.  Hopefully we can remove the sutures at that point.

## 2019-03-26 ENCOUNTER — Ambulatory Visit (INDEPENDENT_AMBULATORY_CARE_PROVIDER_SITE_OTHER): Payer: 59 | Admitting: Podiatry

## 2019-03-26 ENCOUNTER — Encounter: Payer: Self-pay | Admitting: Podiatry

## 2019-03-26 ENCOUNTER — Other Ambulatory Visit: Payer: Self-pay

## 2019-03-26 DIAGNOSIS — Z9889 Other specified postprocedural states: Secondary | ICD-10-CM

## 2019-03-26 DIAGNOSIS — G5761 Lesion of plantar nerve, right lower limb: Secondary | ICD-10-CM

## 2019-03-26 NOTE — Progress Notes (Signed)
She presents today date of surgery 03/07/2019 neurectomy second interdigital space of the right foot.  She denies fever chills nausea vomiting muscle aches pains.  Objective: Vital signs are stable she is alert and oriented x3.  There is no erythema edema cellulitis drainage or odor.  Sutures are intact margins appear to be well coapted  Assessment: Well-healing surgical foot.  Plan: Remove sutures today apply Steri-Strips left wrist again is wet tomorrow follow-up with her in a couple of weeks should she have questions or concerns she will notify us immediately.

## 2019-04-02 ENCOUNTER — Encounter: Payer: 59 | Admitting: Podiatry

## 2019-04-09 ENCOUNTER — Other Ambulatory Visit: Payer: Self-pay

## 2019-04-09 ENCOUNTER — Ambulatory Visit (INDEPENDENT_AMBULATORY_CARE_PROVIDER_SITE_OTHER): Payer: 59 | Admitting: Podiatry

## 2019-04-09 ENCOUNTER — Encounter: Payer: Self-pay | Admitting: Podiatry

## 2019-04-09 VITALS — Temp 98.0°F

## 2019-04-09 DIAGNOSIS — G5761 Lesion of plantar nerve, right lower limb: Secondary | ICD-10-CM

## 2019-04-09 DIAGNOSIS — Z9889 Other specified postprocedural states: Secondary | ICD-10-CM

## 2019-04-09 NOTE — Progress Notes (Signed)
She presents today date of surgery 03/07/2019 with a neurectomy right states that is doing some better has a little bump on the bottom of the incision is just now starting to heal well.  Objective: Vital signs are stable she is alert and oriented x3.  There is no erythema to some mild edema no cellulitis drainage odor she has a thick area between the second and third toes where we removed the neuroma but this is more than likely just due to the edema and swelling the fact that she has been on the foot.  Assessment: Well-healing surgical foot.  Plan: Number request that she get back into regular tennis shoes and I will follow-up with her in a couple of 3 weeks.

## 2019-04-16 ENCOUNTER — Encounter: Payer: 59 | Admitting: Podiatry

## 2019-04-30 ENCOUNTER — Other Ambulatory Visit: Payer: Self-pay

## 2019-04-30 ENCOUNTER — Ambulatory Visit (INDEPENDENT_AMBULATORY_CARE_PROVIDER_SITE_OTHER): Payer: 59 | Admitting: Podiatry

## 2019-04-30 ENCOUNTER — Encounter: Payer: Self-pay | Admitting: Podiatry

## 2019-04-30 VITALS — Temp 96.6°F

## 2019-04-30 DIAGNOSIS — Z9889 Other specified postprocedural states: Secondary | ICD-10-CM

## 2019-04-30 DIAGNOSIS — G5761 Lesion of plantar nerve, right lower limb: Secondary | ICD-10-CM

## 2019-04-30 NOTE — Progress Notes (Signed)
She presents today date of surgery 03/07/2019 with a neurectomy second interdigital space of the right foot states that it is better the incision has closed nicely but is still little sensitive to the touch.  She states that she is still getting some zingers.  Objective: Vital signs are stable alert and oriented x3.  Pulses are palpable.  Neurologic sensorium is intact.  Deep tendon flexors are intact.  Muscle strength is normal bilateral.  Orthopedic evaluation of straits all joints distal ankle full range of motion no crepitation.  Scar to the second and digital space appears to be healing very nicely there is some adhesion distally.  She has some tenderness but there is no edema at this point.  Assessment: Well-healing neurectomy second digital space.  Plan: Encouraged her to continue to try to get into a tennis shoe full-time and even a sandal or flip-flop before going back to taekwondo.  And I will follow-up with her in 6 weeks

## 2019-05-23 ENCOUNTER — Ambulatory Visit (INDEPENDENT_AMBULATORY_CARE_PROVIDER_SITE_OTHER): Payer: 59 | Admitting: Licensed Clinical Social Worker

## 2019-05-23 DIAGNOSIS — F411 Generalized anxiety disorder: Secondary | ICD-10-CM

## 2019-05-23 DIAGNOSIS — F331 Major depressive disorder, recurrent, moderate: Secondary | ICD-10-CM | POA: Diagnosis not present

## 2019-05-23 NOTE — Progress Notes (Signed)
Comprehensive Clinical Assessment (CCA) Note  05/23/2019 Greg Eckrich [id number]  Virtual Visit via Video Note  I connected with Sara Suarez on 05/23/19 at  8:00 AM EDT by a video enabled telemedicine application and verified that I am speaking with the correct person using two identifiers.  I discussed the limitations of evaluation and management by telemedicine and the availability of in person appointments. The patient expressed understanding and agreed to proceed.   I discussed the assessment and treatment plan with the patient. The patient was provided an opportunity to ask questions and all were answered. The patient agreed with the plan and demonstrated an understanding of the instructions.   The patient was advised to call back or seek an in-person evaluation if the symptoms worsen or if the condition fails to improve as anticipated.     Visit Diagnosis:      ICD-10-CM   1. Generalized anxiety disorder  F41.1   2. Major depressive disorder, recurrent episode, moderate (HCC)  F33.1      CCA Part One  Part One has been completed on paper by the patient.  (See scanned document in Chart Review)  CCA Part Two A  Intake/Chief Complaint:  CCA Intake With Chief Complaint CCA Part Two Date: 05/23/19 CCA Part Two Time: 0800 Chief Complaint/Presenting Problem: anxiety Patients Currently Reported Symptoms/Problems: face feels weird when anxious, low motivation, crying, not functioning, panics, worrying Collateral Involvement: husband Initial Clinical Notes/Concerns: depressed to the point of SI as a teen, the only reason she didn't kill herself was because she didn't know a failsafe way to do it  Mental Health Symptoms Depression:  Depression: Change in energy/activity, Fatigue, Tearfulness  Mania:  Mania: N/A  Anxiety:   Anxiety: Tension, Worrying, Restlessness, Difficulty concentrating, Irritability(gets panicky about things that don't always make sense)   Psychosis:  Psychosis: N/A  Trauma:  Trauma: N/A  Obsessions:  Obsessions: N/A  Compulsions:  Compulsions: N/A  Inattention:  Inattention: N/A  Hyperactivity/Impulsivity:   N/a  Oppositional/Defiant Behaviors:   N/a  Borderline Personality:   N/a  Other Mood/Personality Symptoms:   N/a   Mental Status Exam Appearance and self-care  Stature:  Stature: Average  Weight:  Weight: Average weight  Clothing:  Clothing: Casual  Grooming:  Grooming: Normal  Cosmetic use:  Cosmetic Use: Age appropriate  Posture/gait:  Posture/Gait: Normal  Motor activity:  Motor Activity: Restless  Sensorium  Attention:  Attention: Distractible  Concentration:  Concentration: Anxiety interferes  Orientation:  Orientation: X5  Recall/memory:  Recall/Memory: Normal  Affect and Mood  Affect:  Affect: Anxious  Mood:  Mood: Anxious  Relating  Eye contact:  Eye Contact: Normal  Facial expression:  Facial Expression: Responsive  Attitude toward examiner:  Attitude Toward Examiner: Cooperative  Thought and Language  Speech flow: Speech Flow: Pressured  Thought content:  Thought Content: Appropriate to mood and circumstances  Preoccupation:   N/a  Hallucinations:   N/a  Organization:   N/a  Transport planner of Knowledge:  Fund of Knowledge: Average  Intelligence:  Intelligence: Average  Abstraction:  Abstraction: Normal  Judgement:  Judgement: Fair  Art therapist:  Reality Testing: Realistic  Insight:  Insight: Fair  Decision Making:  Decision Making: Normal  Social Functioning  Social Maturity:  Social Maturity: Responsible  Social Judgement:  Social Judgement: Normal  Stress  Stressors:  Stressors: Family conflict, Work, Transitions  Coping Ability:  Coping Ability: Deficient supports, English as a second language teacher Deficits:      Supports:  Family and Psychosocial History: Family history Marital status: Married Number of Years Married: 4 What types of issues is patient dealing with in  the relationship?: husband is major stressor, settled into a comfortable rhythym while he was away and when he returned it became very stressful Does patient have children?: No  Childhood History:  Childhood History By whom was/is the patient raised?: Both parents Description of patient's relationship with caregiver when they were a child: tense with mother because mother was authoritative and felt she could never do anything right, mom yelled a lot, thought it was anger but now realizes it was probably mom's anxiety Patient's description of current relationship with people who raised him/her: good with both Does patient have siblings?: Yes Number of Siblings: 1 Description of patient's current relationship with siblings: good with sister, she also has Generalized Anxiety Disorder Did patient suffer any verbal/emotional/physical/sexual abuse as a child?: No Did patient suffer from severe childhood neglect?: No Has patient ever been sexually abused/assaulted/raped as an adolescent or adult?: No Was the patient ever a victim of a crime or a disaster?: No Witnessed domestic violence?: No Has patient been effected by domestic violence as an adult?: No  CCA Part Two B  Employment/Work Situation: Employment / Work Copywriter, advertising Employment situation: Employed 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 long has patient been employed?: 12 Did You Receive Any Psychiatric Treatment/Services While in Passenger transport manager?: No  Education: Education Did Teacher, adult education From [employer]?: Yes Did Physicist, medical?: Yes Did You Have An Individualized Education Program (IIEP): No Did You Have Any Difficulty At [employer]?: No  Exercise/Diet: Exercise/Diet Do You Exercise?: No Have You Gained or Lost A Significant Amount of Weight in the Past Six Months?: No Do You Follow a Special Diet?: No Do You Have Any Trouble Sleeping?: No  CCA Part Two C  Alcohol/Drug Use: Alcohol / Drug Use History of  alcohol / drug use?: Yes Substance #1 Name of Substance 1: alcohol 1 - Age of First Use: unsure 1 - Amount (size/oz): one or two drinks 1 - Frequency: every now and then 1 - Duration: does not drink when sad, does not keep alcohol in the house becuase husband has a problem with alcohol  CCA Part Three  ASAM's:  Six Dimensions of Multidimensional Assessment  Dimension 1:  Acute Intoxication and/or Withdrawal Potential:     Dimension 2:  Biomedical Conditions and Complications:     Dimension 3:  Emotional, Behavioral, or Cognitive Conditions and Complications:     Dimension 4:  Readiness to Change:     Dimension 5:  Relapse, Continued use, or Continued Problem Potential:     Dimension 6:  Recovery/Living Environment:      Substance use Disorder (SUD)    Social Function:  Social Functioning Social Maturity: Responsible Social Judgement: Normal  Stress:  Stress Stressors: Family conflict, Work, Transitions Coping Ability: Deficient supports, Overwhelmed Patient Takes Medications The Way The Doctor Instructed?: Yes Priority Risk: Low Acuity  Risk Assessment- Self-Harm Potential: Risk Assessment For Self-Harm Potential Thoughts of Self-Harm: No current thoughts Method: No plan Availability of Means: No access/NA  Risk Assessment -Dangerous to Others Potential: Risk Assessment For Dangerous to Others Potential Method: No Plan Availability of Means: No access or NA Intent: Vague intent or NA Notification Required: No need or identified person  DSM5 Diagnoses: Patient Active Problem List   Diagnosis Date Noted  . Mild intermittent asthma, uncomplicated 35/45/6256  . Family history of breast cancer 08/01/2016  .  Tremor 08/01/2016  . Allergic rhinitis 08/31/2014  . Bruxism 08/31/2014  . Headache, migraine 08/31/2014  . Colpospasm 08/31/2014  . Back ache 07/22/2014  . Insomnia, persistent 07/22/2014  . Anxiety and depression 07/18/2014  . Chronic constipation 07/18/2014   . Routine general medical examination at a health care facility 04/16/2012    Patient Centered Plan: Patient is on the following Treatment Plan(s):  Depression  Recommendations for Services/Supports/Treatments: Recommendations for Services/Supports/Treatments Recommendations For Services/Supports/Treatments: Medication Management, Individual Therapy  Treatment Plan Summary: Reduce overall frequency, level and intensity of anxiety so that daily functioning is not impaired.   I provided 57 minutes of non-face-to-face time during this encounter.   Lillie Fragmin, LCSW

## 2019-06-11 ENCOUNTER — Encounter: Payer: Self-pay | Admitting: Podiatry

## 2019-06-11 ENCOUNTER — Other Ambulatory Visit: Payer: Self-pay

## 2019-06-11 ENCOUNTER — Ambulatory Visit (INDEPENDENT_AMBULATORY_CARE_PROVIDER_SITE_OTHER): Payer: 59 | Admitting: Podiatry

## 2019-06-11 VITALS — Temp 97.3°F

## 2019-06-11 DIAGNOSIS — G5761 Lesion of plantar nerve, right lower limb: Secondary | ICD-10-CM

## 2019-06-11 DIAGNOSIS — Z9889 Other specified postprocedural states: Secondary | ICD-10-CM

## 2019-06-11 NOTE — Progress Notes (Signed)
She presents today for her final postop visit date of surgery 03/07/2019 neurectomy second InterDigital space right foot.  States that is doing better still have some numbness and swelling.  Objective: Vital signs are stable alert and oriented x3.  No erythema no cellulitis drainage or odor to some mild edema.  Toe is rectus she has some sensation between the toes but is lost that dorsally.  Assessment: Well-healing surgical neurectomy.  Plan: Follow-up with her on an as-needed basis.

## 2019-06-20 ENCOUNTER — Ambulatory Visit (INDEPENDENT_AMBULATORY_CARE_PROVIDER_SITE_OTHER): Payer: 59 | Admitting: Licensed Clinical Social Worker

## 2019-06-20 ENCOUNTER — Encounter: Payer: Self-pay | Admitting: Licensed Clinical Social Worker

## 2019-06-20 ENCOUNTER — Other Ambulatory Visit: Payer: Self-pay

## 2019-06-20 DIAGNOSIS — F331 Major depressive disorder, recurrent, moderate: Secondary | ICD-10-CM

## 2019-06-20 DIAGNOSIS — F411 Generalized anxiety disorder: Secondary | ICD-10-CM

## 2019-06-20 NOTE — Progress Notes (Signed)
Virtual Visit via Video Note  I connected with Sara Suarez on 06/20/19 at 11:00 AM EDT by a video enabled telemedicine application and verified that I am speaking with the correct person using two identifiers.   I discussed the limitations of evaluation and management by telemedicine and the availability of in person appointments. The patient expressed understanding and agreed to proceed.  I discussed the assessment and treatment plan with the patient. The patient was provided an opportunity to ask questions and all were answered. The patient agreed with the plan and demonstrated an understanding of the instructions.   The patient was advised to call back or seek an in-person evaluation if the symptoms worsen or if the condition fails to improve as anticipated.  I provided 60 minutes of non-face-to-face time during this encounter.   Sara Hipp, LCSW    THERAPIST PROGRESS NOTE  Session Time: 1100  Participation Level: Active  Behavioral Response: NAAlertAnxious  Type of Therapy: Individual Therapy  Treatment Goals addressed: Anxiety  Interventions: Supportive  Summary: Sara Suarez is a 43 y.o. female who presents with symptoms related to her diagnosis. Sara Suarez was transferred from another therapist at another office. Because of this, we spent the session reviewing Sara Suarez's past history. Sara Suarez reported difficulty with her medications and that was the reason she came to [facility]--to have her medications adjusted. LCSW provided some psychoeducation regarding medications and encouraged Sara Suarez to discuss these concerns with MD in the clinic at her upcoming appointment. Sara Suarez went on to discuss current issues. She reports being lucky to still have her job during the pandemic, and has enjoyed working from home. She reports, "I am tolerating my personal quarantine very well." Sara Suarez went on to discussing her relationship with her husband. "I moved out because he was an alcoholic. He has  anger problems. We couldn't have any discussion about anything." Sara Suarez reports she moved out in 2013, but stated they ended up trying to work things out. About a year and half went by and we got back together and I moved back in. Old habits have creeped back in slowly, but it's not gotten that bad again. Sara Suarez went on to discuss her routine during quarantine. "At the beginning, in March, my husband was away traveling for business. It was scary if he was going to get into the country or not. But, he was a good month behind everything that was going on over here." Sara Suarez notes quarantine was a very difficult time for her marriage until he went back to work, "things got a lot better."   Suicidal/Homicidal: No  Therapist Response: Allisa continues to work towards her tx goals but has not yet reached them. We will continue to work on emotional regulation skills.   Plan: Return again in 30 weeks.  Diagnosis: Axis I: Generalized Anxiety Disorder    Axis II: No diagnosis    Sara Hipp, LCSW 06/20/2019

## 2019-07-04 ENCOUNTER — Other Ambulatory Visit: Payer: Self-pay

## 2019-07-04 ENCOUNTER — Other Ambulatory Visit: Payer: Self-pay | Admitting: Podiatry

## 2019-07-04 ENCOUNTER — Ambulatory Visit: Payer: 59 | Admitting: Podiatry

## 2019-07-04 ENCOUNTER — Ambulatory Visit (INDEPENDENT_AMBULATORY_CARE_PROVIDER_SITE_OTHER): Payer: 59

## 2019-07-04 ENCOUNTER — Encounter: Payer: Self-pay | Admitting: Podiatry

## 2019-07-04 VITALS — Temp 98.6°F

## 2019-07-04 DIAGNOSIS — S93402A Sprain of unspecified ligament of left ankle, initial encounter: Secondary | ICD-10-CM | POA: Diagnosis not present

## 2019-07-04 DIAGNOSIS — S99912A Unspecified injury of left ankle, initial encounter: Secondary | ICD-10-CM

## 2019-07-04 DIAGNOSIS — M659 Synovitis and tenosynovitis, unspecified: Secondary | ICD-10-CM

## 2019-07-06 NOTE — Progress Notes (Signed)
   Subjective:  43 y.o. female presenting today with a chief complaint of intermittent sharp pain of the left ankle that began about three weeks ago after an injury. She states she fell and twisted the ankle when the pain began. She has been icing the ankle and taking Ibuprofen which help to alleviate the pain. Walking increases the pain. Patient is here for further evaluation and treatment.   Past Medical History:  Diagnosis Date  . Allergy   . Anxiety      Objective / Physical Exam:  General:  The patient is alert and oriented x3 in no acute distress. Dermatology:  Skin is warm, dry and supple bilateral lower extremities. Negative for open lesions or macerations. Vascular:  Palpable pedal pulses bilaterally. No edema or erythema noted. Capillary refill within normal limits. Neurological:  Epicritic and protective threshold grossly intact bilaterally.  Musculoskeletal Exam:  Pain on palpation to the lateral aspect of the patient's left ankle. Mild edema noted. Range of motion within normal limits to all pedal and ankle joints bilateral. Muscle strength 5/5 in all groups bilateral.   Radiographic Exam:  Normal osseous mineralization. Joint spaces preserved. No fracture/dislocation/boney destruction.    Assessment: 1. Ankle sprain left - lateral aspect   Plan of Care:  1. Patient was evaluated. X-Rays reviewed.  2. Injection of 0.5 mL Celestone Soluspan injected in the patient's left ankle. 3. Continue taking Motrin 800 mg.  4. Resume using CAM boot.  5. Recommended ace wraps.  6. Return to clinic as needed.    Edrick Kins, DPM [facility]  Dr. Edrick Kins, [address]                                        [place], [place] [address]                Office [phone]  Fax [phone]

## 2019-07-09 ENCOUNTER — Encounter: Payer: Self-pay | Admitting: Psychiatry

## 2019-07-09 ENCOUNTER — Other Ambulatory Visit: Payer: Self-pay

## 2019-07-09 ENCOUNTER — Ambulatory Visit (INDEPENDENT_AMBULATORY_CARE_PROVIDER_SITE_OTHER): Payer: 59 | Admitting: Psychiatry

## 2019-07-09 VITALS — Ht 64.0 in

## 2019-07-09 DIAGNOSIS — F411 Generalized anxiety disorder: Secondary | ICD-10-CM | POA: Diagnosis not present

## 2019-07-09 DIAGNOSIS — F329 Major depressive disorder, single episode, unspecified: Secondary | ICD-10-CM

## 2019-07-09 DIAGNOSIS — F32A Depression, unspecified: Secondary | ICD-10-CM

## 2019-07-09 DIAGNOSIS — F5105 Insomnia due to other mental disorder: Secondary | ICD-10-CM | POA: Insufficient documentation

## 2019-07-09 DIAGNOSIS — F988 Other specified behavioral and emotional disorders with onset usually occurring in childhood and adolescence: Secondary | ICD-10-CM | POA: Insufficient documentation

## 2019-07-09 DIAGNOSIS — F908 Attention-deficit hyperactivity disorder, other type: Secondary | ICD-10-CM | POA: Diagnosis not present

## 2019-07-09 MED ORDER — DULOXETINE HCL 30 MG PO CPEP: 30.0000 mg | ORAL_CAPSULE | Freq: Every day | ORAL | 0 refills | Status: DC

## 2019-07-09 MED ORDER — HYDROXYZINE HCL 25 MG PO TABS: 12.5000 mg | ORAL_TABLET | Freq: Two times a day (BID) | ORAL | 1 refills | Status: DC | PRN

## 2019-07-09 MED ORDER — TRAZODONE HCL 50 MG PO TABS: 25.0000 mg | ORAL_TABLET | Freq: Every evening | ORAL | 1 refills | Status: DC | PRN

## 2019-07-09 NOTE — Progress Notes (Signed)
Virtual Visit via Video Note  I connected with Sara Suarez on 07/09/19 at  9:00 AM EDT by a video enabled telemedicine application and verified that I am speaking with the correct person using two identifiers.   I discussed the limitations of evaluation and management by telemedicine and the availability of in person appointments. The patient expressed understanding and agreed to proceed.  I discussed the assessment and treatment plan with the patient. The patient was provided an opportunity to ask questions and all were answered. The patient agreed with the plan and demonstrated an understanding of the instructions.   The patient was advised to call back or seek an in-person evaluation if the symptoms worsen or if the condition fails to improve as anticipated.   Psychiatric Initial Adult Assessment   Patient Identification: Sara Suarez MRN:  [id number] Date of Evaluation:  07/09/2019 Referral Source: Ezequiel Kayser MD Chief Complaint:   Chief Complaint    Establish Care; ADD; Anxiety; Depression; Weight Gain     Visit Diagnosis:    ICD-10-CM   1. Generalized anxiety disorder  F41.1 DULoxetine (CYMBALTA) 30 MG capsule    hydrOXYzine (ATARAX/VISTARIL) 25 MG tablet  2. Insomnia due to mental condition  F51.05 traZODone (DESYREL) 50 MG tablet  3. Depressive disorder  F32.9    unspecified  4. Attention deficit hyperactivity disorder (ADHD), other type  F90.8        History of Present Illness:  Sara Suarez is a 43 year old Caucasian female, married, lives in [place], has a history of anxiety disorder, ADD, history of depression, sleep problems, was evaluated by telemedicine today.  Patient was recently seen by therapist Ms. Lillie Fragmin.  She also has upcoming appointment with Ms. Alden Hipp here in this clinic.  She reports she has been struggling with depression and anxiety all her life.  She reports she had depressive symptoms of feeling sad and suicidal, at least 3  episodes in her life.  She reports she never got help as a teenager or when she was younger however after she got married and she had relationship struggles she eventually decided to get help.  This was several years ago.  She reports she was started on Cymbalta 10 to 12 years ago and she has been on the Cymbalta ever since.  She currently takes 60 mg.  She reports the Cymbalta could be keeping her depressive symptoms under control.  She does not feel overtly depressed anymore.  She however does struggle with sleep problems.  She reports she sleeps 3 to 4 hours most nights.  She is often restless and sleep.  She is currently not on any sleep medications.  She does report anxiety symptoms all the time.  She reports she worries about different things on a regular basis.  She reports her anxiety symptoms started getting worse after her husband returned from abroad.  She reports due to the COVID-19 quarantine he had to stay with her for several weeks and this kind of made her anxiety symptoms worse.  She reports her husband is an alcoholic and also has an anger issues.  She reports this kind of makes her more on edge and anxious.  She reports she has anxiety attacks when she feels overwhelmed and has numbness of her extremities.  She reports when she feels that way she does breathing techniques and tries to calm herself down.  She has taken clonazepam in the past which may have helped.  Patient denies any history of trauma.  Patient  denies any manic or hypomanic symptoms at this time.  She does report a history of ADD, she is currently being treated by [place] attention specialist.  She is on Vyvanse which she takes 5 mg up to twice a day.  She reports even that does not seem to be helpful.  Her Vyvanse was increased to a higher dosage in the past but she did not tolerate that well.  Patient denies any substance abuse problems.  Associated Signs/Symptoms: Depression Symptoms:  Sleep problems,denies other  depressive symptoms (Hypo) Manic Symptoms:  denies Anxiety Symptoms:  Excessive Worry, Panic Symptoms, Psychotic Symptoms:  denies PTSD Symptoms: Negative  Past Psychiatric History: Patient reports a history of depression and anxiety, was being treated by primary care provider.  She recently saw Ms. Lillie Fragmin for therapy.  She will be seeing Ms. Alden Hipp in our clinic for therapy sessions.  Previous Psychotropic Medications: Yes Cymbalta, Klonopin, Ambien  Substance Abuse History in the last 12 months:  No.  Consequences of Substance Abuse: Negative  Past Medical History:  Past Medical History:  Diagnosis Date  . ADHD (attention deficit hyperactivity disorder)   . Allergy   . Anxiety   . Depression     Past Surgical History:  Procedure Laterality Date  . BREAST CYST ASPIRATION Right   . ENDOSCOPIC PLANTAR FASCIOTOMY    . FOOT SURGERY    . MOUTH SURGERY    . TUBAL LIGATION      Family Psychiatric History: As noted below.  Mother may have anxiety disorder  Family History:  Family History  Problem Relation Age of Onset  . Breast cancer Mother 28  . Anxiety disorder Mother   . Breast cancer Maternal Aunt 65  . Anxiety disorder Sister   . Panic disorder Sister   . Dementia Maternal Grandmother   . Dementia Paternal Grandmother     Social History:   Social History   Socioeconomic History  . Marital status: Married    Spouse name: jon  . Number of children: 0  . Years of education: Not on file  . Highest education level: Bachelor's degree (e.g., BA, AB, BS)  Occupational History  . Not on file  Social Needs  . Financial resource strain: Not hard at all  . Food insecurity    Worry: Never true    Inability: Never true  . Transportation needs    Medical: No    Non-medical: No  Tobacco Use  . Smoking status: Never Smoker  . Smokeless tobacco: Never Used  Substance and Sexual Activity  . Alcohol use: Never    Frequency: Never  . Drug use: Never   . Sexual activity: Not on file  Lifestyle  . Physical activity    Days per week: 0 days    Minutes per session: 0 min  . Stress: Rather much  Relationships  . Social Herbalist on phone: Not on file    Gets together: Not on file    Attends religious service: Never    Active member of club or organization: Yes    Attends meetings of clubs or organizations: More than 4 times per year    Relationship status: Married  Other Topics Concern  . Not on file  Social History Narrative  . Not on file    Additional Social History: Patient reports she was raised by both parents.  Her mother was very dominating and was angry all the time.  She has a bachelor's degree in  IT.  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  She is married since the past 14 years.  She has relationship problems with her husband.  She denies having children.  She currently lives in [place]. Allergies:   Allergies  Allergen Reactions  . Chlorpheniramine-Pseudoeph     Hyperactive  . Pseudoephedrine     Hyperactive  . Shellfish Allergy Other (See Comments)  . Hydrocodone Itching  . Prednisone Anxiety    insomnia    Metabolic Disorder Labs: No results found for: HGBA1C, MPG No results found for: PROLACTIN No results found for: CHOL, TRIG, HDL, CHOLHDL, VLDL, LDLCALC No results found for: TSH  Therapeutic Level Labs: No results found for: LITHIUM No results found for: CBMZ No results found for: VALPROATE  Current Medications: Current Outpatient Medications  Medication Sig Dispense Refill  . Cetirizine HCl 10 MG CAPS Take by mouth.    . cyclobenzaprine (FLEXERIL) 5 MG tablet Take by mouth.    . DULoxetine (CYMBALTA) 60 MG capsule Take by mouth.    Marland Kitchen ibuprofen (ADVIL,MOTRIN) 200 MG tablet Take by mouth.    . Naftifine HCl 2 % GEL Apply 1 application topically 2 (two) times daily. 60 g 2  . SUMAtriptan (IMITREX) 50 MG tablet Take by mouth.    . triamcinolone (NASACORT) 55 MCG/ACT AERO nasal inhaler Nasal  Allergy 55 mcg spray aerosol  2 SPRAYS ONCE A DAY NASALLY 30 DAYS    . VITAMIN D PO Take by mouth.    Marland Kitchen VYVANSE 10 MG CHEW Chew 1 tablet by mouth 2 (two) times daily.  0  . DULoxetine (CYMBALTA) 30 MG capsule Take 1 capsule (30 mg total) by mouth daily. To be combined with 60 mg 90 capsule 0  . hydrOXYzine (ATARAX/VISTARIL) 25 MG tablet Take 0.5-1 tablets (12.5-25 mg total) by mouth 2 (two) times daily as needed. For anxiety attack 60 tablet 1  . traZODone (DESYREL) 50 MG tablet Take 0.5-1 tablets (25-50 mg total) by mouth at bedtime as needed for sleep. 30 tablet 1   No current facility-administered medications for this visit.     Musculoskeletal: Strength & Muscle Tone: UTA Gait & Station: normal Patient leans: N/A  Psychiatric Specialty Exam: Review of Systems  Psychiatric/Behavioral: The patient is nervous/anxious and has insomnia.   All other systems reviewed and are negative.   Height 5\' 4"  (1.626 m), last menstrual period 07/06/2019.Body mass index is 29.28 kg/m.  General Appearance: Casual  Eye Contact:  Fair  Speech:  Clear and Coherent  Volume:  Normal  Mood:  Anxious  Affect:  Congruent  Thought Process:  Goal Directed and Descriptions of Associations: Intact  Orientation:  Full (Time, Place, and Person)  Thought Content:  Logical  Suicidal Thoughts:  No  Homicidal Thoughts:  No  Memory:  Immediate;   Fair Recent;   Good Remote;   Fair  Judgement:  Fair  Insight:  Fair  Psychomotor Activity:  Normal  Concentration:  Concentration: Fair and Attention Span: Fair  Recall:  [employer] of Knowledge:Fair  Language: Fair  Akathisia:  No  Handed:  Right  AIMS (if indicated):  Denies tremors, rigidity  Assets:  Communication Skills Desire for Improvement Housing Talents/Skills  ADL's:  Intact  Cognition: WNL  Sleep:  Poor   Screenings:   Assessment and Plan: Emilene is a 43 year old Caucasian female, married, employed, lives in [place], has a history of  anxiety, depression, insomnia was evaluated by telemedicine today.  Patient is biologically predisposed given her  family history.  She also has psychosocial stressors of relationship struggles with her husband who struggles with alcoholism.  Patient currently denies suicidality.  She is motivated to get help.  She will need medication management as well as psychotherapy sessions.  Plan Generalized anxiety disorder-unstable Increase Cymbalta to 90 mg p.o. daily Continue CBT with therapist Ms. Alden Hipp. Add hydroxyzine 12.5 to 25 mg p.o. twice daily as needed for anxiety attacks  For depressive disorder-unspecified- currently stable We will continue to monitor closely.  For insomnia- unstable Start trazodone 25 to 50 mg p.o. nightly as needed Discussed with patient to work on sleep hygiene.  For history of ADD- she will continue to follow-up with [place] attention specialist. She is on Vyvanse 5 mg up to twice a day. Discussed with patient the effect of stimulants on her anxiety and sleep.  Patient will need the following labs-TSH-she will talk to her primary care provider.  Follow-up in clinic in 3 to 4 weeks or sooner if needed.  September 24 at 1 PM  I have spent atleast 40 minutes non face to face with patient today. More than 50 % of the time was spent for psychoeducation and supportive psychotherapy and care coordination. This note was generated in part or whole with voice recognition software. Voice recognition is usually quite accurate but there are transcription errors that can and very often do occur. I apologize for any typographical errors that were not detected and corrected.       Ursula Alert, MD 8/26/20201:12 PM

## 2019-07-10 ENCOUNTER — Encounter: Payer: Self-pay | Admitting: Licensed Clinical Social Worker

## 2019-07-10 ENCOUNTER — Ambulatory Visit (INDEPENDENT_AMBULATORY_CARE_PROVIDER_SITE_OTHER): Payer: 59 | Admitting: Licensed Clinical Social Worker

## 2019-07-10 DIAGNOSIS — F411 Generalized anxiety disorder: Secondary | ICD-10-CM | POA: Diagnosis not present

## 2019-07-10 NOTE — Progress Notes (Signed)
Virtual Visit via Video Note  I connected with Sara Suarez on 07/10/19 at  2:30 PM EDT by a video enabled telemedicine application and verified that I am speaking with the correct person using two identifiers.   I discussed the limitations of evaluation and management by telemedicine and the availability of in person appointments. The patient expressed understanding and agreed to proceed.  I discussed the assessment and treatment plan with the patient. The patient was provided an opportunity to ask questions and all were answered. The patient agreed with the plan and demonstrated an understanding of the instructions.   The patient was advised to call back or seek an in-person evaluation if the symptoms worsen or if the condition fails to improve as anticipated.  I provided 35 minutes of non-face-to-face time during this encounter.   Alden Hipp, LCSW    THERAPIST PROGRESS NOTE  Session Time: 1430  Participation Level: Minimal  Behavioral Response: CasualAlertAnxious  Type of Therapy: Individual Therapy  Treatment Goals addressed: Anxiety  Interventions: Supportive  Summary: Sara Suarez is a 43 y.o. female who presents with continued symptoms related to her diagnosis. Sara Suarez reports doing well since our last session. She reports she forgot about this appointment, and due to that did not prepare topics she would like to discuss. LCSW validated feelings around the situation, but encouraged Sara Suarez to talk about whatever she would like to. Sara Suarez reported feeling like she is unable to complete tasks. We discussed ways to set smaller goals in order to build momentum and in turn a feeling of accomplishment. Sara Suarez was resistant to suggestions from LCSW, but was able to accept ideas after more explanation. Sara Suarez went on to discuss her anxiety and depression symptoms. She reported feeling like she needed better coping mechanisms to handle her emotions. LCSW introduced the idea of CBT,  and explained how CBT can assist with emotional regulation. We walked through several examples and Sara Suarez expressed understanding and agreement. LCSW provided Sara Suarez, via email, with several resources including a thought record, to assist in introducing her to CBT and its applications.   Suicidal/Homicidal: No   Therapist Response: Sara Suarez continues to work towards her tx goals but has not yet reached them. She was easily distracted during today's session, and reported feeling anxious about "being in the middle of a lot of different things." LCSW explained it would be helpful for Sara Suarez to write things down as they happen so she is able to actively participate in therapy.   Plan: Return again in 4 weeks.  Diagnosis: Axis I: Generalized Anxiety Disorder    Axis II: No diagnosis    Alden Hipp, LCSW 07/10/2019

## 2019-07-21 IMAGING — MG MM DIGITAL SCREENING BILAT W/ TOMO W/ CAD
8 series · 8 of 24 positions shown · non-contrast
Comparison: Previous exam(s).

CLINICAL DATA: Screening.

EXAM:
DIGITAL SCREENING BILATERAL MAMMOGRAM WITH TOMO AND CAD

[R MLO synth-2D]
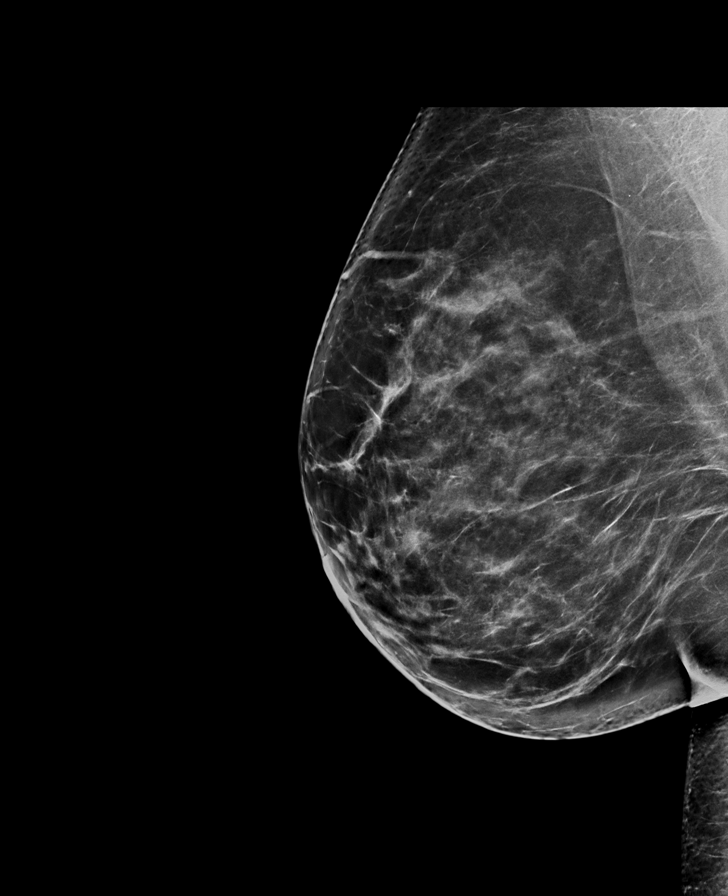

[L CC synth-2D]
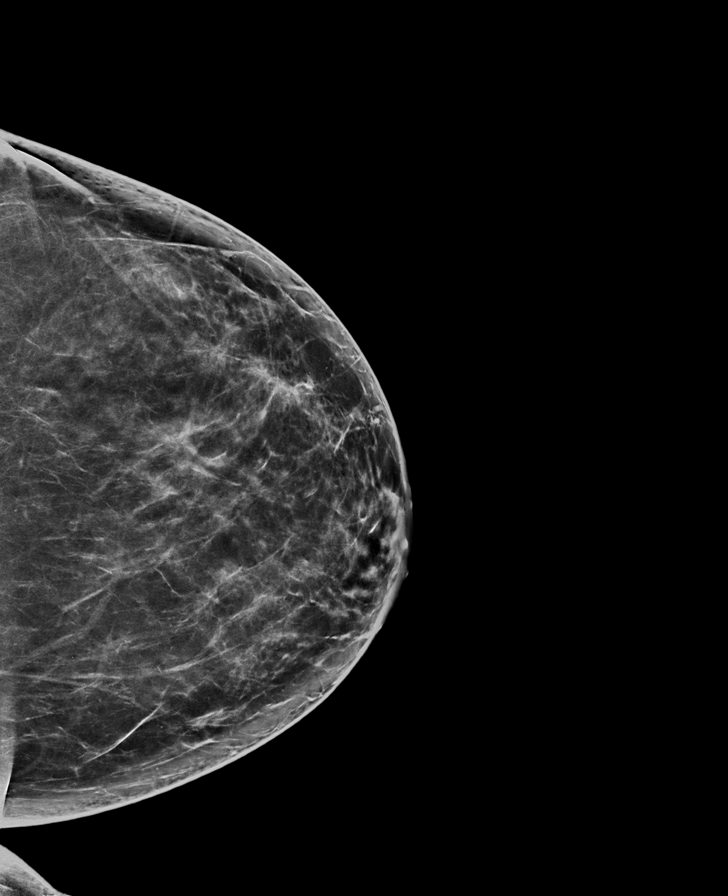

[R CC synth-2D]
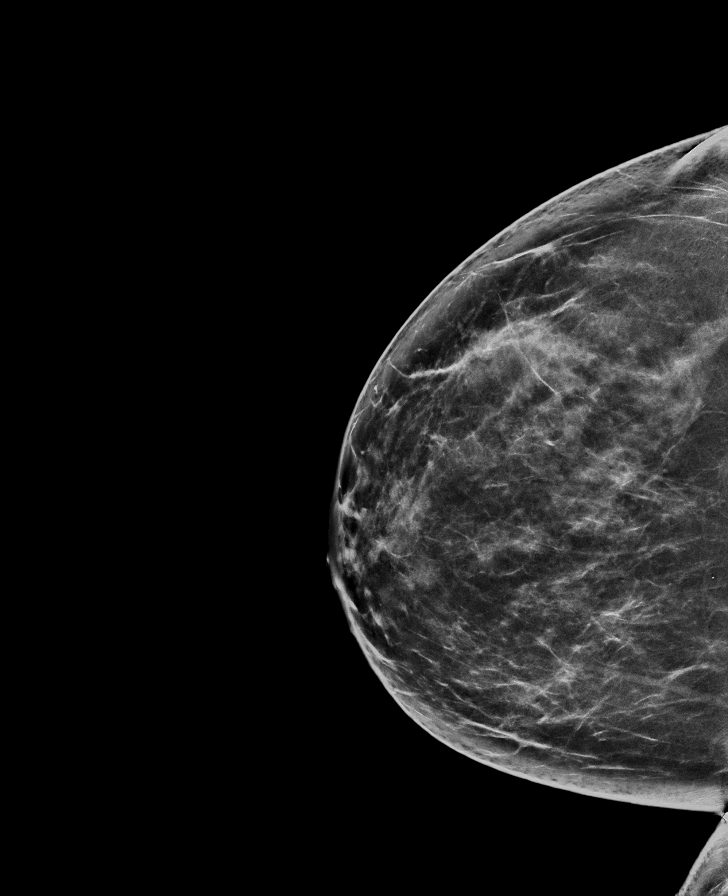

[L MLO synth-2D]
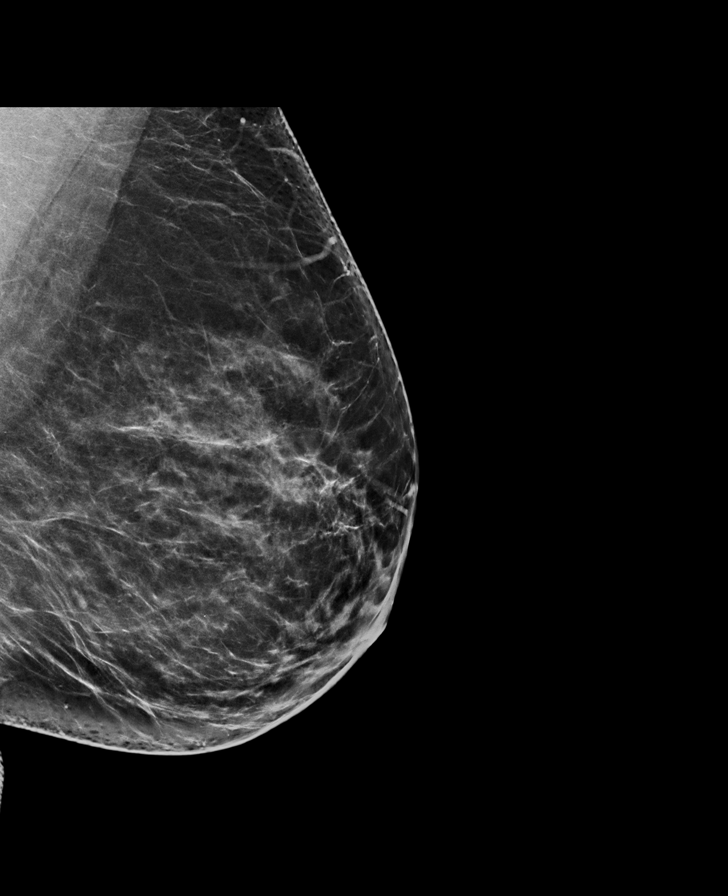

[L MLO tomo · tomo slice 45/90.0]
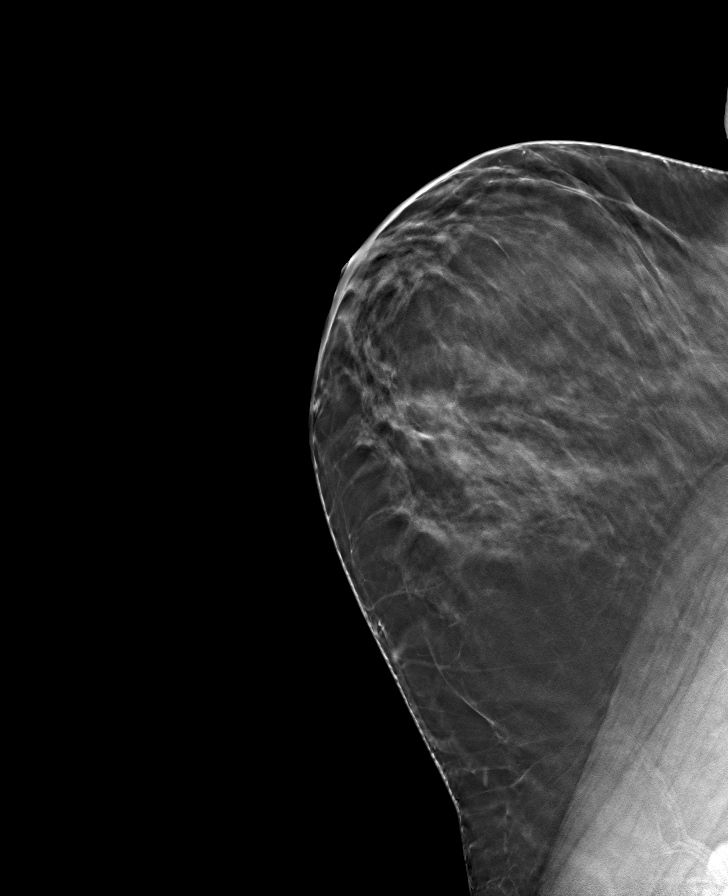

[L CC tomo · tomo slice 43/85.0]
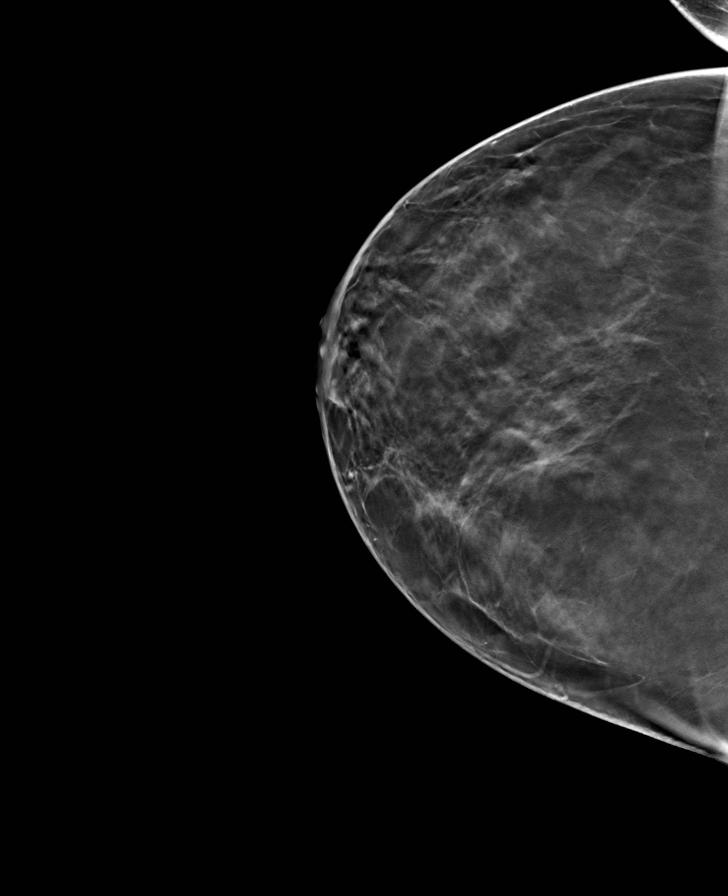

[R CC tomo · tomo slice 43/86.0]
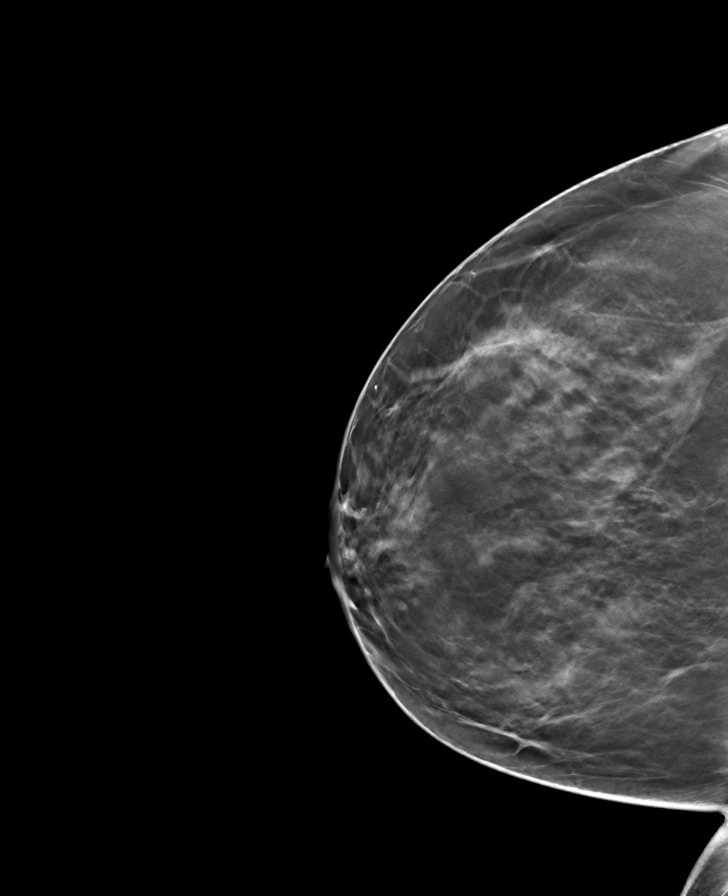

[R MLO tomo · tomo slice 47/94.0]
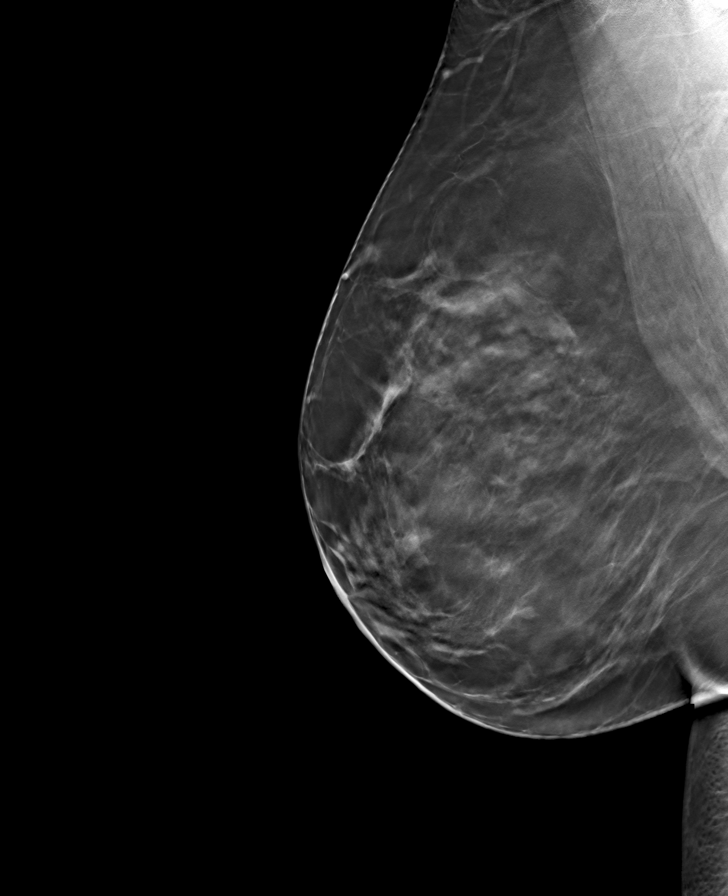

[8 of 24 positions shown; findings below may reference images not displayed]

ACR Breast Density Category b: There are scattered areas of
fibroglandular density.
FINDINGS: There are no findings suspicious for malignancy. Images were
processed with CAD.
IMPRESSION: No mammographic evidence of malignancy. A result letter of this
screening mammogram will be mailed directly to the patient.

RECOMMENDATION:
Screening mammogram in one year. (Code:[id number])

BI-RADS CATEGORY  1: Negative.

## 2019-07-22 NOTE — Progress Notes (Signed)
DOS  03/07/2019  Excision neuroma second interdigital space right foot.

## 2019-08-07 ENCOUNTER — Encounter: Payer: Self-pay | Admitting: Psychiatry

## 2019-08-07 ENCOUNTER — Ambulatory Visit (INDEPENDENT_AMBULATORY_CARE_PROVIDER_SITE_OTHER): Payer: 59 | Admitting: Psychiatry

## 2019-08-07 ENCOUNTER — Other Ambulatory Visit: Payer: Self-pay

## 2019-08-07 DIAGNOSIS — F5105 Insomnia due to other mental disorder: Secondary | ICD-10-CM

## 2019-08-07 DIAGNOSIS — F411 Generalized anxiety disorder: Secondary | ICD-10-CM

## 2019-08-07 DIAGNOSIS — F908 Attention-deficit hyperactivity disorder, other type: Secondary | ICD-10-CM

## 2019-08-07 MED ORDER — TRAZODONE HCL 100 MG PO TABS: 100.0000 mg | ORAL_TABLET | Freq: Every day | ORAL | 1 refills | Status: DC

## 2019-08-07 MED ORDER — ZOLPIDEM TARTRATE 5 MG PO TABS: 5.0000 mg | ORAL_TABLET | Freq: Every evening | ORAL | 0 refills | Status: DC | PRN

## 2019-08-07 NOTE — Progress Notes (Signed)
Virtual Visit via Video Note  I connected with Sara Suarez on 08/07/19 at  1:00 PM EDT by a video enabled telemedicine application and verified that I am speaking with the correct person using two identifiers.   I discussed the limitations of evaluation and management by telemedicine and the availability of in person appointments. The patient expressed understanding and agreed to proceed.   I discussed the assessment and treatment plan with the patient. The patient was provided an opportunity to ask questions and all were answered. The patient agreed with the plan and demonstrated an understanding of the instructions.   The patient was advised to call back or seek an in-person evaluation if the symptoms worsen or if the condition fails to improve as anticipated.  [facility] MD OP Progress Note  08/07/2019 5:21 PM Sara Suarez  MRN:  [id number]  Chief Complaint:  Chief Complaint    Follow-up     HPI: Sara Suarez is a 42 year old Caucasian female, married, lives in [place], has a history of anxiety disorder, ADHD, insomnia, depressive disorder unspecified was evaluated by telemedicine today.  Patient today reports she and her husband has started working on their problems.  She reports her mood symptoms were mostly situational and she was able to sit down with her husband and he is currently trying to make some changes.  She reports she is excited about that.  She reports she continues to struggle with sleep.  She reports she tried taking the trazodone.  She reports that 50 mg does not help her to sleep.  She reports however that the hydroxyzine has helped her in the past more than the trazodone.  She reports she is on this new ADHD medication called Strattera.  She reports she is on 25 mg.  The Vyvanse was discontinued.  Discussed with patient about the interaction between Strattera and her Cymbalta as well as trazodone.  Discussed with her that Cymbalta can be reduced to a lower dosage now  that she is on the Strattera.  Also discussed adding a sleep medication like Ambien.  She reports she may have tried Ambien in the past and it has helped.  She agrees with plan.  Patient denies any suicidality, homicidality or perceptual disturbances.  She will continue to work with her therapist. Visit Diagnosis:    ICD-10-CM   1. Generalized anxiety disorder  F41.1   2. Insomnia due to mental condition  F51.05 zolpidem (AMBIEN) 5 MG tablet    DISCONTINUED: traZODone (DESYREL) 100 MG tablet  3. Attention deficit hyperactivity disorder (ADHD), other type  F90.8     Past Psychiatric History: I have reviewed past psychiatric history from my progress note on 07/09/2019.  Past trials of Cymbalta, Klonopin, Ambien  Past Medical History:  Past Medical History:  Diagnosis Date  . ADHD (attention deficit hyperactivity disorder)   . Allergy   . Anxiety   . Depression     Past Surgical History:  Procedure Laterality Date  . BREAST CYST ASPIRATION Right   . ENDOSCOPIC PLANTAR FASCIOTOMY    . FOOT SURGERY    . MOUTH SURGERY    . TUBAL LIGATION      Family Psychiatric History: I have reviewed family psychiatric history from my progress note on 07/09/2019  Family History:  Family History  Problem Relation Age of Onset  . Breast cancer Mother 66  . Anxiety disorder Mother   . Breast cancer Maternal Aunt 65  . Anxiety disorder Sister   . Panic disorder  Sister   . Dementia Maternal Grandmother   . Dementia Paternal Grandmother     Social History: I have reviewed social history from my progress note on 07/09/2019 Social History   Socioeconomic History  . Marital status: Married    Spouse name: jon  . Number of children: 0  . Years of education: Not on file  . Highest education level: Bachelor's degree (e.g., BA, AB, BS)  Occupational History  . Not on file  Social Needs  . Financial resource strain: Not hard at all  . Food insecurity    Worry: Never true    Inability: Never  true  . Transportation needs    Medical: No    Non-medical: No  Tobacco Use  . Smoking status: Never Smoker  . Smokeless tobacco: Never Used  Substance and Sexual Activity  . Alcohol use: Never    Frequency: Never  . Drug use: Never  . Sexual activity: Not on file  Lifestyle  . Physical activity    Days per week: 0 days    Minutes per session: 0 min  . Stress: Rather much  Relationships  . Social Herbalist on phone: Not on file    Gets together: Not on file    Attends religious service: Never    Active member of club or organization: Yes    Attends meetings of clubs or organizations: More than 4 times per year    Relationship status: Married  Other Topics Concern  . Not on file  Social History Narrative  . Not on file    Allergies:  Allergies  Allergen Reactions  . Chlorpheniramine-Pseudoeph     Hyperactive  . Pseudoephedrine     Hyperactive  . Shellfish Allergy Other (See Comments)  . Hydrocodone Itching  . Prednisone Anxiety    insomnia    Metabolic Disorder Labs: No results found for: HGBA1C, MPG No results found for: PROLACTIN No results found for: CHOL, TRIG, HDL, CHOLHDL, VLDL, LDLCALC No results found for: TSH  Therapeutic Level Labs: No results found for: LITHIUM No results found for: VALPROATE No components found for:  CBMZ  Current Medications: Current Outpatient Medications  Medication Sig Dispense Refill  . atomoxetine (STRATTERA) 25 MG capsule TAKE 1 CAPSULE DAILY FOR 1 WEEK THEN TAKE 2 CAPSULES DAILY AFTER WITH A LARGE MEAL    . Cetirizine HCl 10 MG CAPS Take by mouth.    . cyclobenzaprine (FLEXERIL) 5 MG tablet Take by mouth.    . DULoxetine (CYMBALTA) 60 MG capsule Take by mouth.    . hydrOXYzine (ATARAX/VISTARIL) 25 MG tablet Take 0.5-1 tablets (12.5-25 mg total) by mouth 2 (two) times daily as needed. For anxiety attack 60 tablet 1  . ibuprofen (ADVIL,MOTRIN) 200 MG tablet Take by mouth.    . Naftifine HCl 2 % GEL Apply 1  application topically 2 (two) times daily. 60 g 2  . SUMAtriptan (IMITREX) 50 MG tablet Take by mouth.    . triamcinolone (NASACORT) 55 MCG/ACT AERO nasal inhaler Nasal Allergy 55 mcg spray aerosol  2 SPRAYS ONCE A DAY NASALLY 30 DAYS    . VITAMIN D PO Take by mouth.    . zolpidem (AMBIEN) 5 MG tablet Take 1 tablet (5 mg total) by mouth at bedtime as needed for sleep. 30 tablet 0   No current facility-administered medications for this visit.      Musculoskeletal: Strength & Muscle Tone: UTA Gait & Station: WNL Patient leans: N/A  Psychiatric Specialty  Exam: Review of Systems  Psychiatric/Behavioral: The patient is nervous/anxious and has insomnia.   All other systems reviewed and are negative.   There were no vitals taken for this visit.There is no height or weight on file to calculate BMI.  General Appearance: Casual  Eye Contact:  Fair  Speech:  Clear and Coherent  Volume:  Normal  Mood:  Anxious improving  Affect:  Appropriate  Thought Process:  Goal Directed and Descriptions of Associations: Intact  Orientation:  Full (Time, Place, and Person)  Thought Content: Logical   Suicidal Thoughts:  No  Homicidal Thoughts:  No  Memory:  Immediate;   Fair Recent;   Fair Remote;   Fair  Judgement:  Fair  Insight:  Fair  Psychomotor Activity:  Normal  Concentration:  Concentration: Fair and Attention Span: Fair  Recall:  [employer] of Knowledge: Fair  Language: Fair  Akathisia:  No  Handed:  Right  AIMS (if indicated):Denies tremors, rigidity  Assets:  Communication Skills Desire for Improvement Social Support  ADL's:  Intact  Cognition: WNL  Sleep:  Poor   Screenings:   Assessment and Plan: Sara Suarez is a 42 year old Caucasian female, married, employed, lives in [place], has a history of anxiety, depression, insomnia, was evaluated by telemedicine today.  Patient is biologically predisposed given her family history.  She also has psychosocial stressors of relationship  struggles with her husband struggles with alcoholism.  Patient is currently struggling with sleep and will continue to need medication readjustment.  Plan Generalized anxiety disorder- some improvement Reduce Cymbalta to 60 mg p.o. daily since she is on Strattera. Continue hydroxyzine 12.5 to 25 mg p.o. twice daily as needed for anxiety attacks Continue CBT with therapist Ms. Alden Hipp  Insomnia-unstable Discontinue trazodone Start Ambien 5 mg p.o. nightly  For history of ADHD-she will continue to follow-up with [facility] attention specialist Continue Strattera 25 mg daily.  Patient will need the following labs-TSH-pending  Follow-up in clinic in 4 weeks or sooner if needed.  I have spent atleast 15 minutes non  face to face with patient today. More than 50 % of the time was spent for psychoeducation and supportive psychotherapy and care coordination. This note was generated in part or whole with voice recognition software. Voice recognition is usually quite accurate but there are transcription errors that can and very often do occur. I apologize for any typographical errors that were not detected and corrected.       Ursula Alert, MD 08/07/2019, 5:21 PM

## 2019-08-11 ENCOUNTER — Encounter: Payer: Self-pay | Admitting: Licensed Clinical Social Worker

## 2019-08-11 ENCOUNTER — Ambulatory Visit (INDEPENDENT_AMBULATORY_CARE_PROVIDER_SITE_OTHER): Payer: 59 | Admitting: Licensed Clinical Social Worker

## 2019-08-11 ENCOUNTER — Other Ambulatory Visit: Payer: Self-pay

## 2019-08-11 DIAGNOSIS — F411 Generalized anxiety disorder: Secondary | ICD-10-CM | POA: Diagnosis not present

## 2019-08-11 NOTE — Progress Notes (Signed)
Virtual Visit via Video Note  I connected with Sara Suarez on 08/11/19 at 10:00 AM EDT by a video enabled telemedicine application and verified that I am speaking with the correct person using two identifiers.   I discussed the limitations of evaluation and management by telemedicine and the availability of in person appointments. The patient expressed understanding and agreed to proceed.  I discussed the assessment and treatment plan with the patient. The patient was provided an opportunity to ask questions and all were answered. The patient agreed with the plan and demonstrated an understanding of the instructions.   The patient was advised to call back or seek an in-person evaluation if the symptoms worsen or if the condition fails to improve as anticipated.  I provided 60 minutes of non-face-to-face time during this encounter.   Alden Hipp, Sara Suarez    THERAPIST PROGRESS NOTE  Session Time: 1000  Participation Level: Active  Behavioral Response: NeatAlertDepressed  Type of Therapy: Individual Therapy  Treatment Goals addressed: Coping  Interventions: Supportive  Summary: Sara Suarez is a 43 y.o. female who presents with continued symptoms related to her diagnosis. Sara Suarez reports not doing well since our last session. She reports her medications were changed a lot between the attention specialist and the MD in our clinic. She reports feeling like she is "not functioning," and is not able to get through the day without crying. We discussed how changes in medications can have a dramatic impact on our distress tolerance. Additionally, Sara Suarez encouraged Sara Suarez to discuss these symptoms with MD. Sara Suarez expressed understanding and agreement. We moved on to discussing Sara Suarez's relationship. She reported sitting her husband down in an effort to tell him she was done with the relationship, and before she had the opportunity, he volunteered to make drastic changes in his behaviors to save  their marriage. Sara Suarez reported being unsure if that's what she truly wanted, or if she was still ready to walk away from the marriage. Sara Suarez encouraged  Sara Suarez to not make herself make any kind of decision until she is feeling more like herself. Sara Suarez was able to see this point and expressed agreement. We talked in depth about her marriage and what changes she has made to improve the relationship as well. We will continue to work on determining how Sara Suarez feels about her relationship once her body is able to regulate the medication changes more effectively.   Suicidal/Homicidal: No  Therapist Response: Sara Suarez continues to work towards her tx goals but has not yet reached them. We will continue to work on emotional regulation skills and improving CBT skills moving forward.   Plan: Return again in 4 weeks.  Diagnosis: Axis I: Generalized Anxiety Disorder    Axis II: No diagnosis    Alden Hipp, Sara Suarez 08/11/2019

## 2019-08-19 ENCOUNTER — Other Ambulatory Visit: Payer: Self-pay | Admitting: Internal Medicine

## 2019-08-19 DIAGNOSIS — Z1231 Encounter for screening mammogram for malignant neoplasm of breast: Secondary | ICD-10-CM

## 2019-09-08 ENCOUNTER — Encounter: Payer: Self-pay | Admitting: Licensed Clinical Social Worker

## 2019-09-08 ENCOUNTER — Ambulatory Visit (INDEPENDENT_AMBULATORY_CARE_PROVIDER_SITE_OTHER): Payer: 59 | Admitting: Licensed Clinical Social Worker

## 2019-09-08 ENCOUNTER — Other Ambulatory Visit: Payer: Self-pay

## 2019-09-08 DIAGNOSIS — F411 Generalized anxiety disorder: Secondary | ICD-10-CM

## 2019-09-08 NOTE — Progress Notes (Signed)
Virtual Visit via Video Note  I connected with Sara Suarez on 09/08/19 at  2:30 PM EDT by a video enabled telemedicine application and verified that I am speaking with the correct person using two identifiers.   I discussed the limitations of evaluation and management by telemedicine and the availability of in person appointments. The patient expressed understanding and agreed to proceed.  I discussed the assessment and treatment plan with the patient. The patient was provided an opportunity to ask questions and all were answered. The patient agreed with the plan and demonstrated an understanding of the instructions.   The patient was advised to call back or seek an in-person evaluation if the symptoms worsen or if the condition fails to improve as anticipated.  I provided 53 minutes of non-face-to-face time during this encounter.   Alden Hipp, LCSW    THERAPIST PROGRESS NOTE  Session Time: 1430  Participation Level: Active  Behavioral Response: NeatAlertAnxious  Type of Therapy: Individual Therapy  Treatment Goals addressed: Coping  Interventions: Supportive  Summary: Sara Suarez is a 43 y.o. female who presents with continued symptoms related to her diagnosis. Sara Suarez reports doing well since our last session. She reports ongoing anxiety and depression at times, but reported she is feeling more like herself now that she has adjusted to the new medication. Sara Suarez reports her primary stressor is her marriage. She reports her husband will often want to do things when she gets home, and has implemented a date night that Sara Suarez often does not want to engage in. She reports feeling resentful that he is making such an effort to change now, when she wanted him to do it "a long time ago." LCSW pointed out that it is unfair to ask him to make changes and then be upset when he does. We discussed what Sara Suarez would need to move forward and allow him to improve his behavior without  resenting him for past behaviors. Sara Suarez was able to recognize she didn't think she was able to move on from past behaviors, and feels like she is done trying with her marriage. LCSW validated that idea, and noted if Sara Suarez is not wanting to improve the relationship, then that's something she needs to determine in herself before allowing her husband to continue making that effort. Sara Suarez was in agreement with this and stated she needed to have a conversation with her husband to let him know where she is emotionally.   Suicidal/Homicidal: No  Therapist Response: Sara Suarez continues to work towards her tx goals but has not yet reached them. We will continue to work on emotional regulation skills and improving communication moving forward.   Plan: Return again in 4 weeks.  Diagnosis: Axis I: Generalized Anxiety Disorder    Axis II: No diagnosis    Alden Hipp, LCSW 09/08/2019

## 2019-09-11 ENCOUNTER — Encounter: Payer: Self-pay | Admitting: Psychiatry

## 2019-09-11 ENCOUNTER — Ambulatory Visit (INDEPENDENT_AMBULATORY_CARE_PROVIDER_SITE_OTHER): Payer: 59 | Admitting: Psychiatry

## 2019-09-11 ENCOUNTER — Other Ambulatory Visit: Payer: Self-pay

## 2019-09-11 DIAGNOSIS — F5105 Insomnia due to other mental disorder: Secondary | ICD-10-CM

## 2019-09-11 DIAGNOSIS — F411 Generalized anxiety disorder: Secondary | ICD-10-CM

## 2019-09-11 DIAGNOSIS — F908 Attention-deficit hyperactivity disorder, other type: Secondary | ICD-10-CM

## 2019-09-11 DIAGNOSIS — F988 Other specified behavioral and emotional disorders with onset usually occurring in childhood and adolescence: Secondary | ICD-10-CM | POA: Insufficient documentation

## 2019-09-11 MED ORDER — ZOLPIDEM TARTRATE 5 MG PO TABS: 5.0000 mg | ORAL_TABLET | Freq: Every evening | ORAL | 1 refills | Status: DC | PRN

## 2019-09-11 NOTE — Progress Notes (Signed)
Virtual Visit via Video Note  I connected with Sara Suarez on 09/11/19 at  3:30 PM EDT by a video enabled telemedicine application and verified that I am speaking with the correct person using two identifiers.   I discussed the limitations of evaluation and management by telemedicine and the availability of in person appointments. The patient expressed understanding and agreed to proceed.   I discussed the assessment and treatment plan with the patient. The patient was provided an opportunity to ask questions and all were answered. The patient agreed with the plan and demonstrated an understanding of the instructions.   The patient was advised to call back or seek an in-person evaluation if the symptoms worsen or if the condition fails to improve as anticipated.   [facility] MD OP Progress Note  09/11/2019 4:01 PM Sara Suarez  MRN:  [id number]  Chief Complaint:  Chief Complaint    Follow-up     HPI: Sara Suarez is a 43 year old Caucasian female, married, lives in [place], has a history of anxiety disorder, insomnia, ADHD was evaluated by telemedicine today.  Patient today reports she and her husband continues to have relationship struggles.  She reports she feels overwhelmed due to the anxiety from the relationship.  She has has asked her husband for a separation.  She reports she is currently taking the Cymbalta 60 mg.  She reports that has been helpful.  She denies any side effects.  She reports she takes Ambien as needed for sleep.  That does help her.  She continues to take Strattera prescribed by her ADHD specialist.  She reports that may be working however she can feel a difference from Vyvanse.  She wants to give it more time.  Patient continues to work with Ms. Alden Hipp and reports therapy sessions is going well.  She denies any suicidality, homicidality or perceptual disturbances.  Patient denies any other concerns today.   Visit Diagnosis:    ICD-10-CM   1.  Generalized anxiety disorder  F41.1   2. Insomnia due to mental condition  F51.05 zolpidem (AMBIEN) 5 MG tablet  3. Attention deficit hyperactivity disorder (ADHD), other type  F90.8     Past Psychiatric History: I have reviewed past psychiatric history from my progress note on 07/09/2019.  Past trials of Cymbalta, Klonopin, Ambien  Past Medical History:  Past Medical History:  Diagnosis Date  . ADHD (attention deficit hyperactivity disorder)   . Allergy   . Anxiety   . Depression     Past Surgical History:  Procedure Laterality Date  . BREAST CYST ASPIRATION Right   . ENDOSCOPIC PLANTAR FASCIOTOMY    . FOOT SURGERY    . MOUTH SURGERY    . TUBAL LIGATION      Family Psychiatric History: Reviewed family psychiatric history from my progress note on 07/09/2019.  Family History:  Family History  Problem Relation Age of Onset  . Breast cancer Mother 46  . Anxiety disorder Mother   . Breast cancer Maternal Aunt 65  . Anxiety disorder Sister   . Panic disorder Sister   . Dementia Maternal Grandmother   . Dementia Paternal Grandmother     Social History: Reviewed social history from my progress note on 07/09/2019. Social History   Socioeconomic History  . Marital status: Married    Spouse name: jon  . Number of children: 0  . Years of education: Not on file  . Highest education level: Bachelor's degree (e.g., BA, AB, BS)  Occupational History  .  Not on file  Social Needs  . Financial resource strain: Not hard at all  . Food insecurity    Worry: Never true    Inability: Never true  . Transportation needs    Medical: No    Non-medical: No  Tobacco Use  . Smoking status: Never Smoker  . Smokeless tobacco: Never Used  Substance and Sexual Activity  . Alcohol use: Never    Frequency: Never  . Drug use: Never  . Sexual activity: Not on file  Lifestyle  . Physical activity    Days per week: 0 days    Minutes per session: 0 min  . Stress: Rather much  Relationships   . Social Herbalist on phone: Not on file    Gets together: Not on file    Attends religious service: Never    Active member of club or organization: Yes    Attends meetings of clubs or organizations: More than 4 times per year    Relationship status: Married  Other Topics Concern  . Not on file  Social History Narrative  . Not on file    Allergies:  Allergies  Allergen Reactions  . Chlorpheniramine-Pseudoeph     Hyperactive  . Pseudoephedrine     Hyperactive  . Shellfish Allergy Other (See Comments)  . Hydrocodone Itching  . Prednisone Anxiety    insomnia    Metabolic Disorder Labs: No results found for: HGBA1C, MPG No results found for: PROLACTIN No results found for: CHOL, TRIG, HDL, CHOLHDL, VLDL, LDLCALC No results found for: TSH  Therapeutic Level Labs: No results found for: LITHIUM No results found for: VALPROATE No components found for:  CBMZ  Current Medications: Current Outpatient Medications  Medication Sig Dispense Refill  . atomoxetine (STRATTERA) 25 MG capsule Take by mouth.    Marland Kitchen atomoxetine (STRATTERA) 25 MG capsule TAKE 1 CAPSULE DAILY FOR 1 WEEK THEN TAKE 2 CAPSULES DAILY AFTER WITH A LARGE MEAL    . Cetirizine HCl 10 MG CAPS Take by mouth.    . cyclobenzaprine (FLEXERIL) 5 MG tablet Take by mouth.    . DULoxetine (CYMBALTA) 60 MG capsule Take by mouth.    . hydrOXYzine (ATARAX/VISTARIL) 25 MG tablet Take 0.5-1 tablets (12.5-25 mg total) by mouth 2 (two) times daily as needed. For anxiety attack 60 tablet 1  . ibuprofen (ADVIL,MOTRIN) 200 MG tablet Take by mouth.    . Naftifine HCl 2 % GEL Apply 1 application topically 2 (two) times daily. 60 g 2  . SUMAtriptan (IMITREX) 50 MG tablet Take by mouth.    . triamcinolone (NASACORT) 55 MCG/ACT AERO nasal inhaler Nasal Allergy 55 mcg spray aerosol  2 SPRAYS ONCE A DAY NASALLY 30 DAYS    . VITAMIN D PO Take by mouth.    . zolpidem (AMBIEN) 5 MG tablet Take 1 tablet (5 mg total) by mouth at  bedtime as needed for sleep. 30 tablet 1   No current facility-administered medications for this visit.      Musculoskeletal: Strength & Muscle Tone: UTA Gait & Station: normal Patient leans: N/A  Psychiatric Specialty Exam: Review of Systems  Psychiatric/Behavioral: The patient is nervous/anxious.   All other systems reviewed and are negative.   There were no vitals taken for this visit.There is no height or weight on file to calculate BMI.  General Appearance: Casual  Eye Contact:  Fair  Speech:  Clear and Coherent  Volume:  Normal  Mood:  Anxious  Affect:  Congruent  Thought Process:  Goal Directed and Descriptions of Associations: Circumstantial  Orientation:  Full (Time, Place, and Person)  Thought Content: Logical   Suicidal Thoughts:  No  Homicidal Thoughts:  No  Memory:  Immediate;   Fair Recent;   Fair Remote;   Fair  Judgement:  Fair  Insight:  Fair  Psychomotor Activity:  Normal  Concentration:  Concentration: Fair and Attention Span: Fair  Recall:  [employer] of Knowledge: Fair  Language: Fair  Akathisia:  No  Handed:  Right  AIMS (if indicated): denies tremors, rigidity  Assets:  Communication Skills Desire for Improvement Financial Resources/Insurance Housing Social Support Talents/Skills  ADL's:  Intact  Cognition: WNL  Sleep:  Improving   Screenings:   Assessment and Plan: Stefania is a 43 year old Caucasian female, married, employed, lives in [place], has a history of anxiety, depression, insomnia was evaluated by telemedicine today.  She is biologically predisposed given her family history.  She also has psychosocial stressors of relationship struggles with her husband who struggles with alcoholism.  Patient is currently making some progress with medication changes.  She will continue to benefit from medications as well as psychotherapy sessions.  Plan GAD-improving Cymbalta 60 mg p.o. daily since she is on Strattera.-Reduced dosage Hydroxyzine  12.5 to 25 mg p.o. twice daily as needed for anxiety attacks Continue CBT with therapist Ms. Alden Hipp  Insomnia-improving Ambien 5 mg p.o. nightly  For history of ADHD-she will continue to follow-up with [facility] attention specialist Strattera 25 mg p.o. daily  Pending labs-TSH  Follow-up in clinic in 6 weeks or sooner if needed.  December 10 at 11:30 AM  I have spent atleast 15 minutes non face to face with patient today. More than 50 % of the time was spent for psychoeducation and supportive psychotherapy and care coordination. This note was generated in part or whole with voice recognition software. Voice recognition is usually quite accurate but there are transcription errors that can and very often do occur. I apologize for any typographical errors that were not detected and corrected.      Ursula Alert, MD 09/11/2019, 4:01 PM

## 2019-09-12 ENCOUNTER — Other Ambulatory Visit: Payer: Self-pay | Admitting: Psychiatry

## 2019-09-12 DIAGNOSIS — F411 Generalized anxiety disorder: Secondary | ICD-10-CM

## 2019-09-17 ENCOUNTER — Other Ambulatory Visit: Payer: Self-pay

## 2019-09-17 ENCOUNTER — Ambulatory Visit
Admission: RE | Admit: 2019-09-17 | Discharge: 2019-09-17 | Disposition: A | Discharge status: Home / Self Care | Payer: 59 | Source: Ambulatory Visit | Attending: Internal Medicine | Admitting: Internal Medicine

## 2019-09-17 ENCOUNTER — Encounter (INDEPENDENT_AMBULATORY_CARE_PROVIDER_SITE_OTHER): Payer: Self-pay

## 2019-09-17 DIAGNOSIS — Z1231 Encounter for screening mammogram for malignant neoplasm of breast: Secondary | ICD-10-CM | POA: Diagnosis present

## 2019-10-08 ENCOUNTER — Other Ambulatory Visit: Payer: Self-pay

## 2019-10-08 ENCOUNTER — Ambulatory Visit (INDEPENDENT_AMBULATORY_CARE_PROVIDER_SITE_OTHER): Payer: 59 | Admitting: Licensed Clinical Social Worker

## 2019-10-08 ENCOUNTER — Encounter: Payer: Self-pay | Admitting: Licensed Clinical Social Worker

## 2019-10-08 DIAGNOSIS — F411 Generalized anxiety disorder: Secondary | ICD-10-CM | POA: Diagnosis not present

## 2019-10-08 DIAGNOSIS — F908 Attention-deficit hyperactivity disorder, other type: Secondary | ICD-10-CM | POA: Diagnosis not present

## 2019-10-08 NOTE — Progress Notes (Signed)
Virtual Visit via Video Note  I connected with Sara Suarez on 10/08/19 at 12:30 PM EST by a video enabled telemedicine application and verified that I am speaking with the correct person using two identifiers.   I discussed the limitations of evaluation and management by telemedicine and the availability of in person appointments. The patient expressed understanding and agreed to proceed.  I discussed the assessment and treatment plan with the patient. The patient was provided an opportunity to ask questions and all were answered. The patient agreed with the plan and demonstrated an understanding of the instructions.   The patient was advised to call back or seek an in-person evaluation if the symptoms worsen or if the condition fails to improve as anticipated.  I provided 45 minutes of non-face-to-face time during this encounter.   Alden Hipp, LCSW    THERAPIST PROGRESS NOTE  Session Time: 1230  Participation Level: Active  Behavioral Response: CasualAlertAnxious  Type of Therapy: Individual Therapy  Treatment Goals addressed: Coping  Interventions: Supportive  Summary: Sara Suarez is a 43 y.o. female who presents with continued symptoms related to her diagnosis. Kashonda reports doing well since our last session. She notes there have been a lot of big changes, but she has been attempting to cope with them the best she can. LCSW validated and normalized Minaal's feelings, and asked her to elaborate further. Sara Suarez reports she asked her husband for a separation, which has been difficult to process. She reports he found an apartment and is planning to move in December 1st. LCSW validated Iman's feelings around this situation, and asked her how she has managed her anxiety/depression throughout. She reports her mental health has been more manageable since she had her medications adjusted, but she has been setting very firm boundaries with her husband in order to facilitate a  more healthy separation. She reports she worries her husband has replaced drinking with going to therapy, "he's just going to therapy like every day or going to support groups virtually. And, he'll do divorce care support groups while I'm in the room which can be weird." LCSW validated Sara Suarez's feelings, and provided information on various levels of care as it relates to therapy, and added it is not abnormal for someone to go to therapy more than once a week. LCSW also validated the idea that being int he room for that conversation could be difficult, and encouraged Sara Suarez to set a boundary there. /[name] expressed understanding and agreement with this information.  Suicidal/Homicidal: No  Therapist Response: [name] continues to work towards her tx goals but has not yet reached them. We will continue to work on emotional regulation skills moving forward and improving distress tolerance skills.   Plan: Return again in 4 weeks.  Diagnosis: Axis I: Generalized Anxiety Disorder    Axis II: No diagnosis    Alden Hipp, LCSW 10/08/2019

## 2019-10-23 ENCOUNTER — Ambulatory Visit (INDEPENDENT_AMBULATORY_CARE_PROVIDER_SITE_OTHER): Payer: 59 | Admitting: Psychiatry

## 2019-10-23 ENCOUNTER — Encounter: Payer: Self-pay | Admitting: Psychiatry

## 2019-10-23 ENCOUNTER — Other Ambulatory Visit: Payer: Self-pay

## 2019-10-23 DIAGNOSIS — F411 Generalized anxiety disorder: Secondary | ICD-10-CM

## 2019-10-23 DIAGNOSIS — F5105 Insomnia due to other mental disorder: Secondary | ICD-10-CM | POA: Diagnosis not present

## 2019-10-23 DIAGNOSIS — F908 Attention-deficit hyperactivity disorder, other type: Secondary | ICD-10-CM | POA: Diagnosis not present

## 2019-10-23 NOTE — Progress Notes (Signed)
Virtual Visit via Video Note  I connected with Sara Suarez on 10/23/19 at 11:30 AM EST by a video enabled telemedicine application and verified that I am speaking with the correct person using two identifiers.   I discussed the limitations of evaluation and management by telemedicine and the availability of in person appointments. The patient expressed understanding and agreed to proceed.     I discussed the assessment and treatment plan with the patient. The patient was provided an opportunity to ask questions and all were answered. The patient agreed with the plan and demonstrated an understanding of the instructions.   The patient was advised to call back or seek an in-person evaluation if the symptoms worsen or if the condition fails to improve as anticipated.   [facility] MD OP Progress Note  10/23/2019 12:38 PM Sara Suarez  MRN:  [id number]  Chief Complaint:  Chief Complaint    Follow-up     HPI: Sara Suarez is a 43 year old Caucasian female, separated, lives in [place], has a history of anxiety disorder, insomnia, ADHD was evaluated by telemedicine today.  Patient today reports she and her husband are currently separated.  He was able to get an apartment for himself.  She reports her anxiety symptoms seems to be improving.  She is coping with the separation well.  She reports she is able to focus at work and at school better than before.  She continues to be compliant on Cymbalta.  Her Strattera is currently at an 80 mg, is currently being readjusted by her ADHD specialist.  So far she is tolerating it well.  She is aware about the drug to drug interaction between Cymbalta and Strattera.  She is monitoring herself.  She reports Ambien does help with sleep as needed.  Patient continues to work with her therapist and reports therapy sessions is going well.  Patient denies any suicidality, homicidality or perceptual disturbances.  Patient is planning to spend her Christmas  holiday with her husband.  She denies any other concerns today. Visit Diagnosis:    ICD-10-CM   1. Generalized anxiety disorder  F41.1   2. Insomnia due to mental condition  F51.05   3. Attention deficit hyperactivity disorder (ADHD), other type  F90.8     Past Psychiatric History: Reviewed past psychiatric history from my progress note on 07/09/2019.  Past trials of Cymbalta, Klonopin, Ambien  Past Medical History:  Past Medical History:  Diagnosis Date  . ADHD (attention deficit hyperactivity disorder)   . Allergy   . Anxiety   . Depression     Past Surgical History:  Procedure Laterality Date  . BREAST CYST ASPIRATION Right    neg  . ENDOSCOPIC PLANTAR FASCIOTOMY    . FOOT SURGERY    . MOUTH SURGERY    . TUBAL LIGATION      Family Psychiatric History: I have reviewed family psychiatric history from my progress note on 07/09/2019.  Family History:  Family History  Problem Relation Age of Onset  . Breast cancer Mother 90  . Anxiety disorder Mother   . Breast cancer Maternal Aunt 65  . Anxiety disorder Sister   . Panic disorder Sister   . Dementia Maternal Grandmother   . Dementia Paternal Grandmother     Social History: Reviewed social history from my progress note on 07/09/2019. Social History   Socioeconomic History  . Marital status: Married    Spouse name: jon  . Number of children: 0  . Years of education: Not  on file  . Highest education level: Bachelor's degree (e.g., BA, AB, BS)  Occupational History  . Not on file  Tobacco Use  . Smoking status: Never Smoker  . Smokeless tobacco: Never Used  Substance and Sexual Activity  . Alcohol use: Never  . Drug use: Never  . Sexual activity: Not on file  Other Topics Concern  . Not on file  Social History Narrative  . Not on file   Social Determinants of Health   Financial Resource Strain: Low Risk   . Difficulty of Paying Living Expenses: Not hard at all  Food Insecurity: No Food Insecurity  .  Worried About Charity fundraiser in the Last Year: Never true  . Ran Out of Food in the Last Year: Never true  Transportation Needs: No Transportation Needs  . Lack of Transportation (Medical): No  . Lack of Transportation (Non-Medical): No  Physical Activity: Inactive  . Days of Exercise per Week: 0 days  . Minutes of Exercise per Session: 0 min  Stress: Stress Concern Present  . Feeling of Stress : Rather much  Social Connections: Unknown  . Frequency of Communication with Friends and Family: Not on file  . Frequency of Social Gatherings with Friends and Family: Not on file  . Attends Religious Services: Never  . Active Member of Clubs or Organizations: Yes  . Attends Archivist Meetings: More than 4 times per year  . Marital Status: Married    Allergies:  Allergies  Allergen Reactions  . Chlorpheniramine-Pseudoeph     Hyperactive  . Pseudoephedrine     Hyperactive  . Shellfish Allergy Other (See Comments)  . Hydrocodone Itching  . Prednisone Anxiety    insomnia    Metabolic Disorder Labs: No results found for: HGBA1C, MPG No results found for: PROLACTIN No results found for: CHOL, TRIG, HDL, CHOLHDL, VLDL, LDLCALC No results found for: TSH  Therapeutic Level Labs: No results found for: LITHIUM No results found for: VALPROATE No components found for:  CBMZ  Current Medications: Current Outpatient Medications  Medication Sig Dispense Refill  . promethazine (PHENERGAN) 25 MG tablet Take by mouth.    Marland Kitchen atomoxetine (STRATTERA) 80 MG capsule Take 80 mg by mouth every morning.    Marland Kitchen azelastine (ASTELIN) 0.1 % nasal spray azelastine 137 mcg (0.1 %) nasal spray aerosol    . Cetirizine HCl 10 MG CAPS Take by mouth.    . cyclobenzaprine (FLEXERIL) 5 MG tablet Take by mouth.    . DULoxetine (CYMBALTA) 60 MG capsule Take by mouth.    . hydrOXYzine (ATARAX/VISTARIL) 25 MG tablet TAKE 0.5-1 TABLETS (12.5-25 MG TOTAL) BY MOUTH 2 (TWO) TIMES DAILY AS NEEDED. FOR  ANXIETY ATTACK 60 tablet 1  . ibuprofen (ADVIL,MOTRIN) 200 MG tablet Take by mouth.    . Naftifine HCl 2 % GEL Apply 1 application topically 2 (two) times daily. 60 g 2  . promethazine (PHENERGAN) 25 MG tablet Take 25 mg by mouth every 6 (six) hours as needed.    . SUMAtriptan (IMITREX) 50 MG tablet Take by mouth.    . triamcinolone (NASACORT) 55 MCG/ACT AERO nasal inhaler Nasal Allergy 55 mcg spray aerosol  2 SPRAYS ONCE A DAY NASALLY 30 DAYS    . VITAMIN D PO Take by mouth.    . zolpidem (AMBIEN) 5 MG tablet Take 1 tablet (5 mg total) by mouth at bedtime as needed for sleep. 30 tablet 1   No current facility-administered medications for this visit.  Musculoskeletal: Strength & Muscle Tone: UTA Gait & Station: normal Patient leans: N/A  Psychiatric Specialty Exam: Review of Systems  Psychiatric/Behavioral: Negative for agitation, behavioral problems, confusion, decreased concentration, dysphoric mood, hallucinations and self-injury. The patient is not nervous/anxious and is not hyperactive.   All other systems reviewed and are negative.   There were no vitals taken for this visit.There is no height or weight on file to calculate BMI.  General Appearance: Casual  Eye Contact:  Fair  Speech:  Normal Rate  Volume:  Normal  Mood:  Euthymic  Affect:  Congruent  Thought Process:  Goal Directed and Descriptions of Associations: Intact  Orientation:  Full (Time, Place, and Person)  Thought Content: Logical   Suicidal Thoughts:  No  Homicidal Thoughts:  No  Memory:  Immediate;   Fair Recent;   Fair Remote;   Fair  Judgement:  Fair  Insight:  Fair  Psychomotor Activity:  Normal  Concentration:  Concentration: Fair and Attention Span: Fair  Recall:  [employer] of Knowledge: Fair  Language: Fair  Akathisia:  No  Handed:  Right  AIMS (if indicated): Denies tremors, rigidity  Assets:  Communication Skills Desire for [place] Talents/Skills Transportation Vocational/Educational  ADL's:  Intact  Cognition: WNL  Sleep:  Fair   Screenings:   Assessment and Plan: Jahniah is a 43 year old Caucasian female, separated, employed, lives in [place], has a history of anxiety, depression, insomnia was evaluated by telemedicine today.  She is biologically predisposed given her family history.  She also has psychosocial stressors of relationship struggles with her husband with whom she is currently separated.  Patient is currently in psychotherapy sessions and reports medications is effective.  Plan as noted below.  Plan GAD-improving Cymbalta 60 mg p.o. daily. Hydroxyzine 12.5 to 25 mg p.o. twice daily as needed for anxiety attacks Continue CBT with therapist Ms. Alden Hipp.   Insomnia-improving Ambien 5 mg p.o. nightly  ADHD-she is currently with [facility] attention specialist Strattera 80 mg p.o. daily.  Pending labs-TSH.  Patient is aware about the drug to drug interaction between Strattera as well as Cymbalta.  She will monitor herself closely.  Follow-up in clinic in 6 weeks or sooner if needed.  January 19 at 11:30 AM  I have spent atleast 15 minutes non face to face with patient today. More than 50 % of the time was spent for psychoeducation and supportive psychotherapy and care coordination. This note was generated in part or whole with voice recognition software. Voice recognition is usually quite accurate but there are transcription errors that can and very often do occur. I apologize for any typographical errors that were not detected and corrected.      Ursula Alert, MD 10/23/2019, 12:38 PM

## 2019-11-03 ENCOUNTER — Encounter: Payer: Self-pay | Admitting: Licensed Clinical Social Worker

## 2019-11-03 ENCOUNTER — Ambulatory Visit (INDEPENDENT_AMBULATORY_CARE_PROVIDER_SITE_OTHER): Payer: 59 | Admitting: Licensed Clinical Social Worker

## 2019-11-03 ENCOUNTER — Other Ambulatory Visit: Payer: Self-pay

## 2019-11-03 ENCOUNTER — Other Ambulatory Visit: Payer: Self-pay | Admitting: Psychiatry

## 2019-11-03 DIAGNOSIS — F411 Generalized anxiety disorder: Secondary | ICD-10-CM | POA: Diagnosis not present

## 2019-11-03 NOTE — Progress Notes (Signed)
Virtual Visit via Video Note  I connected with Sara Suarez on 11/03/19 at 12:30 PM EST by a video enabled telemedicine application and verified that I am speaking with the correct person using two identifiers.   I discussed the limitations of evaluation and management by telemedicine and the availability of in person appointments. The patient expressed understanding and agreed to proceed.    I discussed the assessment and treatment plan with the patient. The patient was provided an opportunity to ask questions and all were answered. The patient agreed with the plan and demonstrated an understanding of the instructions.   The patient was advised to call back or seek an in-person evaluation if the symptoms worsen or if the condition fails to improve as anticipated.  I provided53 minutes of non-face-to-face time during this encounter.   Alden Hipp, LCSW    THERAPIST PROGRESS NOTE  Session Time: 1230  Participation Level: Active  Behavioral Response: NeatAlertAnxious  Type of Therapy: Individual Therapy  Treatment Goals addressed: Coping  Interventions: Supportive  Summary: Sara Suarez is a 43 y.o. female who presents with continued symptoms related to her diagnosis. Sara Suarez reports doing well since our last session. She reports she and her husband have officially separated, and they are no longer living in the same home. Sara Suarez reports she is feeling okay about the situation, and feeling like this is a good thing. She reports she has had to set boundaries with her husband several times, as he was coming over unannounced for various reasons. Sara Suarez reported she feels her husband thinks they are getting back together, as he keeps telling her how well he's doing and pointing out his progress. :LCSW asked Sara Suarez if she was working towards that goal as well--to get back together. Sara Suarez reported she was unsure if that's what she wanted or not. LCSW encouraged Sara Suarez to attempt to figure  out what she wants moving forward, and then to relay that information to her husband. LCSW emphasized the idea that Sara Suarez only owes her husband her honesty, and moving forward, that's all she is obligated to give him. Sara Suarez expressed agreement and stated she feels she has given him as many chances as she wants to. LCSW validated this idea and encouraged her to relay this to her husband. Further, LCSW encouraged Sara Suarez to be very clear with her boundaries and what is appropriate vs not appropriate moving forward. Sara Suarez expressed understanding and agreement.   Suicidal/Homicidal: No  Therapist Response: Sara Suarez continues to work towards her tx goals but has not yet reached them. We will continue to work on improving emotional regulation moving forward.   Plan: Return again in 4 weeks.  Diagnosis: Axis I: Generalized Anxiety Disorder    Axis II: No diagnosis    Alden Hipp, LCSW 11/03/2019

## 2019-11-11 ENCOUNTER — Other Ambulatory Visit: Payer: Self-pay | Admitting: Psychiatry

## 2019-11-11 DIAGNOSIS — F411 Generalized anxiety disorder: Secondary | ICD-10-CM

## 2019-12-02 ENCOUNTER — Ambulatory Visit (INDEPENDENT_AMBULATORY_CARE_PROVIDER_SITE_OTHER): Payer: 59 | Admitting: Psychiatry

## 2019-12-02 ENCOUNTER — Other Ambulatory Visit: Payer: Self-pay

## 2019-12-02 ENCOUNTER — Encounter: Payer: Self-pay | Admitting: Psychiatry

## 2019-12-02 DIAGNOSIS — F908 Attention-deficit hyperactivity disorder, other type: Secondary | ICD-10-CM

## 2019-12-02 DIAGNOSIS — F5105 Insomnia due to other mental disorder: Secondary | ICD-10-CM

## 2019-12-02 DIAGNOSIS — F411 Generalized anxiety disorder: Secondary | ICD-10-CM | POA: Diagnosis not present

## 2019-12-02 NOTE — Progress Notes (Signed)
Provider Location : [facility] Patient Location : Home   Virtual Visit via Video Note  I connected with Sara Suarez on 12/02/19 at 11:30 AM EST by a video enabled telemedicine application and verified that I am speaking with the correct person using two identifiers.   I discussed the limitations of evaluation and management by telemedicine and the availability of in person appointments. The patient expressed understanding and agreed to proceed.    I discussed the assessment and treatment plan with the patient. The patient was provided an opportunity to ask questions and all were answered. The patient agreed with the plan and demonstrated an understanding of the instructions.   The patient was advised to call back or seek an in-person evaluation if the symptoms worsen or if the condition fails to improve as anticipated.  [facility] MD OP Progress Note  12/02/2019 1:00 PM Sara Suarez  MRN:  [id number]  Chief Complaint:  Chief Complaint    Follow-up     HPI: Sara Suarez is a 44 year old Caucasian female, separated, lives in [place], has a history of anxiety disorder, insomnia, ADHD was evaluated by telemedicine today.  Patient today reports she is currently struggling with some lack of motivation and off.  She reports it is usually when it comes to doing activities outside of her house.  She reports it could also be because of the pandemic and also stress associated with it.  She does not know if she really feels depressed or not.  She reports sleep is restless however she has started taking Ambien as needed and that helps.  Patient reports she developed side effects of acid reflux to Strattera and hence it was changed to Intuniv recently.  She reports her acid reflux seems to be getting better at this point.  She has not started getting any good benefit from the Intuniv yet.  She has upcoming appointment with her providers.  She continues to be in psychotherapy sessions and that helps her to  cope with her stress of separation from her husband.  Patient denies any suicidality perceptual disturbances.  Patient denies any other concerns today. Visit Diagnosis:    ICD-10-CM   1. Generalized anxiety disorder  F41.1   2. Insomnia due to mental condition  F51.05   3. Attention deficit hyperactivity disorder (ADHD), other type  F90.8     Past Psychiatric History: I have reviewed past psychiatric history from my progress note on 07/09/2019.  Past trials of Cymbalta, Klonopin, Ambien.  Past Medical History:  Past Medical History:  Diagnosis Date  . ADHD (attention deficit hyperactivity disorder)   . Allergy   . Anxiety   . Depression     Past Surgical History:  Procedure Laterality Date  . BREAST CYST ASPIRATION Right    neg  . ENDOSCOPIC PLANTAR FASCIOTOMY    . FOOT SURGERY    . MOUTH SURGERY    . TUBAL LIGATION      Family Psychiatric History: I have reviewed family psychiatric history from my progress note on 07/09/2019.  Family History:  Family History  Problem Relation Age of Onset  . Breast cancer Mother 54  . Anxiety disorder Mother   . Breast cancer Maternal Aunt 65  . Anxiety disorder Sister   . Panic disorder Sister   . Dementia Maternal Grandmother   . Dementia Paternal Grandmother     Social History: I have reviewed social history from my progress note on 07/09/2019. Social History   Socioeconomic History  . Marital  status: Married    Spouse name: jon  . Number of children: 0  . Years of education: Not on file  . Highest education level: Bachelor's degree (e.g., BA, AB, BS)  Occupational History  . Not on file  Tobacco Use  . Smoking status: Never Smoker  . Smokeless tobacco: Never Used  Substance and Sexual Activity  . Alcohol use: Never  . Drug use: Never  . Sexual activity: Not on file  Other Topics Concern  . Not on file  Social History Narrative  . Not on file   Social Determinants of Health   Financial Resource Strain: Low Risk    . Difficulty of Paying Living Expenses: Not hard at all  Food Insecurity: No Food Insecurity  . Worried About Charity fundraiser in the Last Year: Never true  . Ran Out of Food in the Last Year: Never true  Transportation Needs: No Transportation Needs  . Lack of Transportation (Medical): No  . Lack of Transportation (Non-Medical): No  Physical Activity: Inactive  . Days of Exercise per Week: 0 days  . Minutes of Exercise per Session: 0 min  Stress: Stress Concern Present  . Feeling of Stress : Rather much  Social Connections: Unknown  . Frequency of Communication with Friends and Family: Not on file  . Frequency of Social Gatherings with Friends and Family: Not on file  . Attends Religious Services: Never  . Active Member of Clubs or Organizations: Yes  . Attends Archivist Meetings: More than 4 times per year  . Marital Status: Married    Allergies:  Allergies  Allergen Reactions  . Chlorpheniramine-Pseudoeph     Hyperactive  . Pseudoephedrine     Hyperactive  . Shellfish Allergy Other (See Comments)  . Hydrocodone Itching  . Prednisone Anxiety    insomnia    Metabolic Disorder Labs: No results found for: HGBA1C, MPG No results found for: PROLACTIN No results found for: CHOL, TRIG, HDL, CHOLHDL, VLDL, LDLCALC No results found for: TSH  Therapeutic Level Labs: No results found for: LITHIUM No results found for: VALPROATE No components found for:  CBMZ  Current Medications: Current Outpatient Medications  Medication Sig Dispense Refill  . azelastine (ASTELIN) 0.1 % nasal spray azelastine 137 mcg (0.1 %) nasal spray aerosol    . Cetirizine HCl 10 MG CAPS Take by mouth.    . cyclobenzaprine (FLEXERIL) 5 MG tablet Take by mouth.    . Diclofenac Sodium (PENNSAID) 2 % SOLN [place] 20 mg/gram/actuation (2 %) topical soln in metered-dose pump  APPLY 2 PUMPS (40 MG) TO THE AFFECTED AREA BY TOPICAL ROUTE 2 TIMES PER DAY    . DULoxetine (CYMBALTA) 60 MG  capsule Take by mouth.    . guanFACINE (INTUNIV) 1 MG TB24 ER tablet Take 1 mg by mouth daily.    . hydrOXYzine (ATARAX/VISTARIL) 25 MG tablet TAKE 0.5-1 TABLETS (12.5-25 MG TOTAL) BY MOUTH 2 (TWO) TIMES DAILY AS NEEDED. FOR ANXIETY ATTACK 60 tablet 1  . ibuprofen (ADVIL,MOTRIN) 200 MG tablet Take by mouth.    . Naftifine HCl 2 % GEL Apply 1 application topically 2 (two) times daily. 60 g 2  . promethazine (PHENERGAN) 25 MG tablet Take 25 mg by mouth every 6 (six) hours as needed.    . promethazine (PHENERGAN) 25 MG tablet Take by mouth.    . SUMAtriptan (IMITREX) 50 MG tablet Take by mouth.    . triamcinolone (NASACORT) 55 MCG/ACT AERO nasal inhaler Nasal Allergy 55  mcg spray aerosol  2 SPRAYS ONCE A DAY NASALLY 30 DAYS    . VITAMIN D PO Take by mouth.    . zolpidem (AMBIEN) 5 MG tablet Take 1 tablet (5 mg total) by mouth at bedtime as needed for sleep. 30 tablet 1   No current facility-administered medications for this visit.     Musculoskeletal: Strength & Muscle Tone: UTA Gait & Station: normal Patient leans: N/A  Psychiatric Specialty Exam: Review of Systems  Gastrointestinal:       Gastroesophageal reflex - improving  Psychiatric/Behavioral:       Lack of motivation to go outside  All other systems reviewed and are negative.   There were no vitals taken for this visit.There is no height or weight on file to calculate BMI.  General Appearance: Casual  Eye Contact:  Fair  Speech:  Clear and Coherent  Volume:  Normal  Mood:  does not feel like doing activities outside if home  Affect:  Congruent  Thought Process:  Goal Directed and Descriptions of Associations: Intact  Orientation:  Full (Time, Place, and Person)  Thought Content: Logical   Suicidal Thoughts:  No  Homicidal Thoughts:  No  Memory:  Immediate;   Fair Recent;   Fair Remote;   Fair  Judgement:  Fair  Insight:  Fair  Psychomotor Activity:  Normal  Concentration:  Concentration: Fair and Attention Span:  Fair  Recall:  [employer] of Knowledge: Fair  Language: Fair  Akathisia:  No  Handed:  Right  AIMS (if indicated):Denies tremors, rigidity  Assets:  Communication Skills Desire for Improvement Housing Social Support  ADL's:  Intact  Cognition: WNL  Sleep:  Fair   Screenings:   Assessment and Plan: Haneen is a 44 year old Caucasian female, separated, employed, lives in [place], has a history of anxiety, depression, insomnia was evaluated by telemedicine today.  Patient is biologically predisposed given her family history.  She also has psychosocial stressors of relationship struggles.  Patient is currently struggling with possible depressive symptoms.  Patient however will continue psychotherapy sessions.  Plan as noted below.  Plan GAD-improving Cymbalta 60 mg p.o. daily Hydroxyzine 12.5 to 25 mg p.o. twice daily as needed for anxiety attacks Continue CBT with therapist Ms. Alden Hipp.  Insomnia-improving Ambien 5 mg p.o. nightly as needed  ADHD-she is currently under the care of [facility] attention specialist.  She is currently on Intuniv.  Pending labs-TSH  Follow-up in clinic in 4 weeks or sooner if needed.  February 25 at 10:30 AM  I have spent atleast 20 minutes non face to face with patient today. More than 50 % of the time was spent for  ordering medications and test ,psychoeducation and supportive psychotherapy and care coordination,as well as documenting clinical information in electronic health record. This note was generated in part or whole with voice recognition software. Voice recognition is usually quite accurate but there are transcription errors that can and very often do occur. I apologize for any typographical errors that were not detected and corrected.       Ursula Alert, MD 12/02/2019, 1:00 PM

## 2019-12-11 ENCOUNTER — Encounter: Payer: Self-pay | Admitting: Licensed Clinical Social Worker

## 2019-12-11 ENCOUNTER — Ambulatory Visit (INDEPENDENT_AMBULATORY_CARE_PROVIDER_SITE_OTHER): Payer: 59 | Admitting: Licensed Clinical Social Worker

## 2019-12-11 ENCOUNTER — Other Ambulatory Visit: Payer: Self-pay

## 2019-12-11 DIAGNOSIS — F411 Generalized anxiety disorder: Secondary | ICD-10-CM

## 2019-12-11 NOTE — Progress Notes (Signed)
  Virtual Visit via Telephone Note  I connected with Sara Suarez on 12/11/19 at  1:30 PM EST by telephone and verified that I am speaking with the correct person using two identifiers.   I discussed the limitations, risks, security and privacy concerns of performing an evaluation and management service by telephone and the availability of in person appointments. I also discussed with the patient that there may be a patient responsible charge related to this service. The patient expressed understanding and agreed to proceed.   I discussed the assessment and treatment plan with the patient. The patient was provided an opportunity to ask questions and all were answered. The patient agreed with the plan and demonstrated an understanding of the instructions.   The patient was advised to call back or seek an in-person evaluation if the symptoms worsen or if the condition fails to improve as anticipated.  I provided 45 minutes of non-face-to-face time during this encounter.   Alden Hipp, LCSW   THERAPIST PROGRESS NOTE  Session Time: 1330  Participation Level: Active  Behavioral Response: CasualAlertAnxious  Type of Therapy: Individual Therapy  Treatment Goals addressed: Anxiety  Interventions: Supportive  Summary: Sara Suarez is a 44 y.o. female who presents with continued symptoms related to her diagnosis. Sara Suarez reports doing well since our last session, but reports she is having increased anxiety around her marriage. She reports her husband is still living in a separate apartment, and has been more respectful of her boundaries. However, Sara Suarez reports he often mentions that he is continuing to work towards "getting her back," which makes her feel guilty as she is unsure if that is what she wants. LCSW validated and normalized Sara Suarez feelings, and held space for her to articulate how she was feeling around the situation. Sara Suarez reports feeling very confused, and not knowing if  she wants to be with him again because it would be easier, or if she wants to be alone and figure things out from there. LCSW encouraged Sara Suarez to recognize she does not need to make a decision right now, and these issues were not born overnight and so their solutions will not be quick either. Sara Suarez expressed understanding and agreement with taking more time to make a decision, and to let herself off the hook in making a decision quickly. We discussed ways to utilize mindfulness in the moment and how she could utilize that when she is feeling overwhelmed.   Suicidal/Homicidal: No  Therapist Response: Sara Suarez continues to work towards her tx goals but has not yet reached them. Sara Suarez will continue to work on improving emotional regulation skills and distress tolerance skills moving forward.   Plan: Return again in 4 weeks.  Diagnosis: Axis I: Generalized Anxiety Disorder    Axis II: No diagnosis    Alden Hipp, LCSW 12/11/2019

## 2019-12-31 ENCOUNTER — Encounter: Payer: Self-pay | Admitting: Licensed Clinical Social Worker

## 2019-12-31 ENCOUNTER — Other Ambulatory Visit: Payer: Self-pay

## 2019-12-31 ENCOUNTER — Ambulatory Visit (INDEPENDENT_AMBULATORY_CARE_PROVIDER_SITE_OTHER): Payer: 59 | Admitting: Licensed Clinical Social Worker

## 2019-12-31 DIAGNOSIS — F411 Generalized anxiety disorder: Secondary | ICD-10-CM

## 2019-12-31 NOTE — Progress Notes (Signed)
Virtual Visit via Video Note  I connected with Sara Suarez on 12/31/19 at  1:30 PM EST by a video enabled telemedicine application and verified that I am speaking with the correct person using two identifiers.   I discussed the limitations of evaluation and management by telemedicine and the availability of in person appointments. The patient expressed understanding and agreed to proceed.  I discussed the assessment and treatment plan with the patient. The patient was provided an opportunity to ask questions and all were answered. The patient agreed with the plan and demonstrated an understanding of the instructions.   The patient was advised to call back or seek an in-person evaluation if the symptoms worsen or if the condition fails to improve as anticipated.  I provided 45 minutes of non-face-to-face time during this encounter.   Alden Hipp, LCSW   THERAPIST PROGRESS NOTE  Session Time: 1330  Participation Level: Active  Behavioral Response: NeatAlertAnxious  Type of Therapy: Individual Therapy  Treatment Goals addressed: Coping  Interventions: Motivational Interviewing and Solution Focused  Summary: Sara Suarez is a 44 y.o. female who presents with continued symptoms related to her diagnosis. Sara Suarez reports doing well since our last session. Sara Suarez reports continued anxiety around making a decision regarding her marriage. Sara Suarez states, "I'm just not sure what I want yet. I don't want to make the wrong decision." LCSW validated Sara Suarez's feelings and highlighted how difficult it can be to live in this "in-between" phase. Sara Suarez agreed and reported she and her husband have been spending the weekends together, which she admits has been very nice. However, Sara Suarez notes she is not able to do anything task oriented around her husband, which she thinks might be due to years of him having inappropriate anger at things she would do. LCSW validated and normalized Sara Suarez feelings, and  agreed that could definitely be the cause of some of her hesitation. Further, it could be difficult to trust that her husband has truly changed behaviors that were problematic during the marriage until they spend more time together, and she is able to do things that he used to get mad about. We discussed not thinking about the relationship in black/white terms, but instead as she moves forward thinking about gathering information to assist in ultimately making the decision. Sara Suarez expressed understanding and agreement with this information and noted she just enjoys her quiet time. We discussed what quiet time would look like if her husband were there, and if she could/would set boundaries to ensure she was able to have that time when he was around.   Suicidal/Homicidal: No   Therapist Response: Sara Suarez continues to work towards her tx goals but has not yet reached them. We will continue to work on improving emotional regulation skills and distress tolerance moving forward.  Plan: Return again in 4 weeks.  Diagnosis: Axis I: Generalized Anxiety Disorder    Axis II: No diagnosis    Alden Hipp, LCSW 12/31/2019

## 2020-01-08 ENCOUNTER — Ambulatory Visit (INDEPENDENT_AMBULATORY_CARE_PROVIDER_SITE_OTHER): Payer: 59 | Admitting: Psychiatry

## 2020-01-08 ENCOUNTER — Encounter: Payer: Self-pay | Admitting: Psychiatry

## 2020-01-08 ENCOUNTER — Other Ambulatory Visit: Payer: Self-pay

## 2020-01-08 DIAGNOSIS — F5105 Insomnia due to other mental disorder: Secondary | ICD-10-CM

## 2020-01-08 DIAGNOSIS — F411 Generalized anxiety disorder: Secondary | ICD-10-CM | POA: Diagnosis not present

## 2020-01-08 DIAGNOSIS — F908 Attention-deficit hyperactivity disorder, other type: Secondary | ICD-10-CM

## 2020-01-08 NOTE — Progress Notes (Signed)
Provider Location : [facility] Patient Location : Home  Virtual Visit via Video Note  I connected with Sara Suarez on 01/08/20 at 10:30 AM EST by a video enabled telemedicine application and verified that I am speaking with the correct person using two identifiers.   I discussed the limitations of evaluation and management by telemedicine and the availability of in person appointments. The patient expressed understanding and agreed to proceed.    I discussed the assessment and treatment plan with the patient. The patient was provided an opportunity to ask questions and all were answered. The patient agreed with the plan and demonstrated an understanding of the instructions.   The patient was advised to call back or seek an in-person evaluation if the symptoms worsen or if the condition fails to improve as anticipated.   Clarence MD OP Progress Note  01/08/2020 12:09 PM Sara Suarez  MRN:  [id number]  Chief Complaint:  Chief Complaint    Follow-up     HPI: Sara Suarez is a 44 year old Caucasian female, separated, lives in [place], has a history of anxiety disorder, insomnia, ADHD was evaluated by telemedicine today.  Patient today reports she feels better the past few days.  She feels more motivated and has been more active.  She has been able to get more work done and is able to focus better.  She reports she was recently started back on the Vyvanse, currently takes a 20 mg chewable tablet which she splits into half and take it in divided dosage.  Ever since she started doing that she has been feeling much better.  She does report sleep issues and it continues to be restless.  She does have Ambien available however she has not been using it often.  She takes hydroxyzine as needed which helps her to wind down and rest.  Patient continues to follow-up with her therapist and is currently working on her relationship problems with her husband.  Patient denies any suicidality, homicidality or  perceptual disturbances.  Patient denies any other concerns today. Visit Diagnosis:    ICD-10-CM   1. Generalized anxiety disorder  F41.1   2. Insomnia due to mental condition  F51.05   3. Attention deficit hyperactivity disorder (ADHD), other type  F90.8     Past Psychiatric History: I have reviewed past psychiatric history from my progress note on 07/09/2019.  Past trials of Cymbalta, Klonopin, Ambien  Past Medical History:  Past Medical History:  Diagnosis Date  . ADHD (attention deficit hyperactivity disorder)   . Allergy   . Anxiety   . Depression     Past Surgical History:  Procedure Laterality Date  . BREAST CYST ASPIRATION Right    neg  . ENDOSCOPIC PLANTAR FASCIOTOMY    . FOOT SURGERY    . MOUTH SURGERY    . TUBAL LIGATION      Family Psychiatric History: I have reviewed family psychiatric history from my progress note on 07/09/2019.  Family History:  Family History  Problem Relation Age of Onset  . Breast cancer Mother 33  . Anxiety disorder Mother   . Breast cancer Maternal Aunt 65  . Anxiety disorder Sister   . Panic disorder Sister   . Dementia Maternal Grandmother   . Dementia Paternal Grandmother     Social History: I have reviewed social history from my progress note on 07/09/2019. Social History   Socioeconomic History  . Marital status: Married    Spouse name: jon  . Number of children: 0  .  Years of education: Not on file  . Highest education level: Bachelor's degree (e.g., BA, AB, BS)  Occupational History  . Not on file  Tobacco Use  . Smoking status: Never Smoker  . Smokeless tobacco: Never Used  Substance and Sexual Activity  . Alcohol use: Never  . Drug use: Never  . Sexual activity: Not on file  Other Topics Concern  . Not on file  Social History Narrative  . Not on file   Social Determinants of Health   Financial Resource Strain: Low Risk   . Difficulty of Paying Living Expenses: Not hard at all  Food Insecurity: No Food  Insecurity  . Worried About Charity fundraiser in the Last Year: Never true  . Ran Out of Food in the Last Year: Never true  Transportation Needs: No Transportation Needs  . Lack of Transportation (Medical): No  . Lack of Transportation (Non-Medical): No  Physical Activity: Inactive  . Days of Exercise per Week: 0 days  . Minutes of Exercise per Session: 0 min  Stress: Stress Concern Present  . Feeling of Stress : Rather much  Social Connections: Unknown  . Frequency of Communication with Friends and Family: Not on file  . Frequency of Social Gatherings with Friends and Family: Not on file  . Attends Religious Services: Never  . Active Member of Clubs or Organizations: Yes  . Attends Archivist Meetings: More than 4 times per year  . Marital Status: Married    Allergies:  Allergies  Allergen Reactions  . Chlorpheniramine-Pseudoeph     Hyperactive  . Pseudoephedrine     Hyperactive  . Shellfish Allergy Other (See Comments)  . Hydrocodone Itching  . Prednisone Anxiety    insomnia    Metabolic Disorder Labs: No results found for: HGBA1C, MPG No results found for: PROLACTIN No results found for: CHOL, TRIG, HDL, CHOLHDL, VLDL, LDLCALC No results found for: TSH  Therapeutic Level Labs: No results found for: LITHIUM No results found for: VALPROATE No components found for:  CBMZ  Current Medications: Current Outpatient Medications  Medication Sig Dispense Refill  . azelastine (ASTELIN) 0.1 % nasal spray azelastine 137 mcg (0.1 %) nasal spray aerosol    . Cetirizine HCl 10 MG CAPS Take by mouth.    . cyclobenzaprine (FLEXERIL) 5 MG tablet Take by mouth.    . Diclofenac Sodium (PENNSAID) 2 % SOLN Pennsaid 20 mg/gram/actuation (2 %) topical soln in metered-dose pump  APPLY 2 PUMPS (40 MG) TO THE AFFECTED AREA BY TOPICAL ROUTE 2 TIMES PER DAY    . DULoxetine (CYMBALTA) 60 MG capsule Take by mouth.    . hydrOXYzine (ATARAX/VISTARIL) 25 MG tablet TAKE 0.5-1 TABLETS  (12.5-25 MG TOTAL) BY MOUTH 2 (TWO) TIMES DAILY AS NEEDED. FOR ANXIETY ATTACK 60 tablet 1  . ibuprofen (ADVIL,MOTRIN) 200 MG tablet Take by mouth.    . Naftifine HCl 2 % GEL Apply 1 application topically 2 (two) times daily. 60 g 2  . promethazine (PHENERGAN) 25 MG tablet Take 25 mg by mouth every 6 (six) hours as needed.    . promethazine (PHENERGAN) 25 MG tablet Take by mouth.    . SUMAtriptan (IMITREX) 50 MG tablet Take by mouth.    . triamcinolone (NASACORT) 55 MCG/ACT AERO nasal inhaler Nasal Allergy 55 mcg spray aerosol  2 SPRAYS ONCE A DAY NASALLY 30 DAYS    . VITAMIN D PO Take by mouth.    Marland Kitchen VYVANSE 20 MG CHEW Chew 1 tablet  by mouth daily.    Marland Kitchen zolpidem (AMBIEN) 5 MG tablet Take 1 tablet (5 mg total) by mouth at bedtime as needed for sleep. 30 tablet 1   No current facility-administered medications for this visit.     Musculoskeletal: Strength & Muscle Tone: UTA Gait & Station: normal Patient leans: N/A  Psychiatric Specialty Exam: Review of Systems  Psychiatric/Behavioral: Negative for agitation, behavioral problems, confusion, decreased concentration, dysphoric mood, hallucinations, self-injury, sleep disturbance and suicidal ideas. The patient is not nervous/anxious and is not hyperactive.   All other systems reviewed and are negative.   There were no vitals taken for this visit.There is no height or weight on file to calculate BMI.  General Appearance: Casual  Eye Contact:  Fair  Speech:  Clear and Coherent  Volume:  Normal  Mood:  Euthymic  Affect:  Congruent  Thought Process:  Goal Directed and Descriptions of Associations: Intact  Orientation:  Full (Time, Place, and Person)  Thought Content: Logical   Suicidal Thoughts:  No  Homicidal Thoughts:  No  Memory:  Immediate;   Fair Recent;   Fair Remote;   Fair  Judgement:  Fair  Insight:  Fair  Psychomotor Activity:  Normal  Concentration:  Concentration: Fair and Attention Span: Fair  Recall:  [employer] of  Knowledge: Fair  Language: Fair  Akathisia:  No  Handed:  Right  AIMS (if indicated): UTA  Assets:  Communication Skills Desire for Improvement Housing Resilience Talents/Skills Transportation Vocational/Educational  ADL's:  Intact  Cognition: WNL  Sleep:  Restless   Screenings:   Assessment and Plan: Dane is a 44 year old Caucasian female, separated, employed, lives in [place], has a history of anxiety, depression, insomnia was evaluated by telemedicine today.  Patient is biologically predisposed given her family history.  Patient also has psychosocial stressors of relationship struggles.  Patient is currently making progress on the current medication regimen.  Plan as noted below.  Plan GAD-improving Cymbalta 60 mg p.o. daily Hydroxyzine 12.5 to 25 mg p.o. twice daily as needed for anxiety attacks Continue CBT with therapist Ms. Alden Hipp  Insomnia-restless Patient advised to increase the Ambien to 10 mg p.o. nightly as needed.  She will continue to work on her sleep hygiene. Patient advised to take her Vyvanse earlier since it can stay in the system longer and can affect her sleep. Patient to continue to use hydroxyzine as needed at bedtime.  For ADHD she is currently under the care of [place] attention specialist.  Her medication was recently changed to Vyvanse 20 mg chewable tablet in divided dosage.  Pending labs-TSH.  Follow-up in clinic in 6 weeks to 8 weeks or sooner if needed.  I have spent atleast 20 minutes non face to face with patient today. More than 50 % of the time was spent for ordering medications and test ,psychoeducation and supportive psychotherapy and care coordination,as well as documenting clinical information in electronic health record. This note was generated in part or whole with voice recognition software. Voice recognition is usually quite accurate but there are transcription errors that can and very often do occur. I apologize for any  typographical errors that were not detected and corrected.       Ursula Alert, MD 01/08/2020, 12:09 PM

## 2020-01-28 ENCOUNTER — Other Ambulatory Visit: Payer: Self-pay

## 2020-01-28 ENCOUNTER — Ambulatory Visit (INDEPENDENT_AMBULATORY_CARE_PROVIDER_SITE_OTHER): Payer: 59 | Admitting: Licensed Clinical Social Worker

## 2020-01-28 ENCOUNTER — Encounter: Payer: Self-pay | Admitting: Licensed Clinical Social Worker

## 2020-01-28 DIAGNOSIS — F411 Generalized anxiety disorder: Secondary | ICD-10-CM | POA: Diagnosis not present

## 2020-01-28 NOTE — Progress Notes (Signed)
  Virtual Visit via Telephone Note  I connected with Sara Suarez on 01/28/20 at  1:30 PM EDT by telephone and verified that I am speaking with the correct person using two identifiers.   I discussed the limitations, risks, security and privacy concerns of performing an evaluation and management service by telephone and the availability of in person appointments. I also discussed with the patient that there may be a patient responsible charge related to this service. The patient expressed understanding and agreed to proceed.    I discussed the assessment and treatment plan with the patient. The patient was provided an opportunity to ask questions and all were answered. The patient agreed with the plan and demonstrated an understanding of the instructions.   The patient was advised to call back or seek an in-person evaluation if the symptoms worsen or if the condition fails to improve as anticipated.  I provided 45 minutes of non-face-to-face time during this encounter.   Alden Hipp, LCSW   THERAPIST PROGRESS NOTE  Session Time: 1330  Participation Level: Active  Behavioral Response: NeatAlertAnxious  Type of Therapy: Individual Therapy  Treatment Goals addressed: Coping  Interventions: Supportive  Summary: Sara Suarez is a 44 y.o. female who presents with continued symptoms related to her diagnosis. Sara Suarez reports doing well since our last session, and noted she has felt less anxious recently. She reports she has allowed her husband to spend more time at the home, which she reports  has been nice to have help around the house. LCSW validated these feelings and encouraged Sara Suarez to be very open with her husband so he does not take this as them getting back together. Sara Suarez expressed understanding and agreement with this information. LCSW held space for Sara Suarez to discuss how she is currently feeling about her marriage, and offered insight where appropriate. LCSW encouraged Sara Suarez  to not think in definites, but rather how she is feeling at the moment. We also discussed ways to set more appropriate boundaries in ways that her husband understands. Sara Suarez expressed understanding and agreement with all information presented.   Suicidal/Homicidal: No   Therapist Response: Sara Suarez to work towards her tx goals but has not yet reached them. We will continue to work on improving emotional regulation and distress tolerance moving forward.   Plan: Return again in 6 weeks.  Diagnosis: Axis I: Generalized Anxiety Disorder    Axis II: No diagnosis    Alden Hipp, LCSW 01/28/2020

## 2020-03-04 ENCOUNTER — Other Ambulatory Visit: Payer: Self-pay

## 2020-03-04 ENCOUNTER — Encounter: Payer: Self-pay | Admitting: Psychiatry

## 2020-03-04 ENCOUNTER — Other Ambulatory Visit: Payer: Self-pay | Admitting: Psychiatry

## 2020-03-04 ENCOUNTER — Telehealth (INDEPENDENT_AMBULATORY_CARE_PROVIDER_SITE_OTHER): Payer: 59 | Admitting: Psychiatry

## 2020-03-04 DIAGNOSIS — F5105 Insomnia due to other mental disorder: Secondary | ICD-10-CM

## 2020-03-04 DIAGNOSIS — F908 Attention-deficit hyperactivity disorder, other type: Secondary | ICD-10-CM

## 2020-03-04 DIAGNOSIS — F411 Generalized anxiety disorder: Secondary | ICD-10-CM | POA: Diagnosis not present

## 2020-03-04 NOTE — Telephone Encounter (Signed)
I have sent Ambien 5 to 7.5 mg at bedtime as needed to pharmacy.

## 2020-03-04 NOTE — Patient Instructions (Signed)
Follow-up in clinic on June 8 at 11 AM

## 2020-03-04 NOTE — Progress Notes (Signed)
Provider Location : [facility] Patient Location : Home  Virtual Visit via Video Note  I connected with Sara Suarez on 03/04/20 at 10:00 AM EDT by a video enabled telemedicine application and verified that I am speaking with the correct person using two identifiers.   I discussed the limitations of evaluation and management by telemedicine and the availability of in person appointments. The patient expressed understanding and agreed to proceed.    I discussed the assessment and treatment plan with the patient. The patient was provided an opportunity to ask questions and all were answered. The patient agreed with the plan and demonstrated an understanding of the instructions.   The patient was advised to call back or seek an in-person evaluation if the symptoms worsen or if the condition fails to improve as anticipated.   [facility] MD OP Progress Note  03/04/2020 12:07 PM Shardey Galo  MRN:  [id number]  Chief Complaint:  Chief Complaint    Follow-up     HPI: Sara Suarez is a 44 year old Caucasian female, separated, lives in [place], has a history of anxiety disorder, insomnia, ADHD was evaluated by telemedicine today.  Patient today reports she is currently doing well on the current medication regimen.  She denies any significant depression, anxiety or mood symptoms.  She reports sleep as good.  She reports she takes the Ambien 10 mg as needed on and off.  A prescription that she filled in September has lasted her for the past several months.  She reports she tried the 5 mg which is not enough so she tried taking 2 of the 5 mg however that may be too much for her.  Patient reports she continues to take Vyvanse which does help her with her attention and focus.  She takes half tablet of 20 mg twice a day.  She denies side effects.  She reports she and her husband are currently working on their relationship.  They are planning to move back in together.  She reports she is aware that her  therapist is leaving the practice and hence is going to reestablish care with a new therapist.  She reports she will reach out to her EAP counselor who she was happy with in the past.  Patient denies any suicidality, homicidality or perceptual disturbances.  Patient denies any other concerns today. Visit Diagnosis:    ICD-10-CM   1. Generalized anxiety disorder  F41.1   2. Insomnia due to mental condition  F51.05   3. Attention deficit hyperactivity disorder (ADHD), other type  F90.8     Past Psychiatric History: I have reviewed past psychiatric history from my progress note on 07/09/2019.  Past trials of Cymbalta, Klonopin, Ambien  Past Medical History:  Past Medical History:  Diagnosis Date  . ADHD (attention deficit hyperactivity disorder)   . Allergy   . Anxiety   . Depression     Past Surgical History:  Procedure Laterality Date  . BREAST CYST ASPIRATION Right    neg  . ENDOSCOPIC PLANTAR FASCIOTOMY    . FOOT SURGERY    . MOUTH SURGERY    . TUBAL LIGATION      Family Psychiatric History: I have reviewed family psychiatric history from my progress note on 07/09/2019  Family History:  Family History  Problem Relation Age of Onset  . Breast cancer Mother 21  . Anxiety disorder Mother   . Breast cancer Maternal Aunt 65  . Anxiety disorder Sister   . Panic disorder Sister   .  Dementia Maternal Grandmother   . Dementia Paternal Grandmother     Social History: I have reviewed social history from my progress note on 07/09/2019 Social History   Socioeconomic History  . Marital status: Married    Spouse name: jon  . Number of children: 0  . Years of education: Not on file  . Highest education level: Bachelor's degree (e.g., BA, AB, BS)  Occupational History  . Not on file  Tobacco Use  . Smoking status: Never Smoker  . Smokeless tobacco: Never Used  Substance and Sexual Activity  . Alcohol use: Never  . Drug use: Never  . Sexual activity: Not on file  Other  Topics Concern  . Not on file  Social History Narrative  . Not on file   Social Determinants of Health   Financial Resource Strain: Low Risk   . Difficulty of Paying Living Expenses: Not hard at all  Food Insecurity: No Food Insecurity  . Worried About Charity fundraiser in the Last Year: Never true  . Ran Out of Food in the Last Year: Never true  Transportation Needs: No Transportation Needs  . Lack of Transportation (Medical): No  . Lack of Transportation (Non-Medical): No  Physical Activity: Inactive  . Days of Exercise per Week: 0 days  . Minutes of Exercise per Session: 0 min  Stress: Stress Concern Present  . Feeling of Stress : Rather much  Social Connections: Unknown  . Frequency of Communication with Friends and Family: Not on file  . Frequency of Social Gatherings with Friends and Family: Not on file  . Attends Religious Services: Never  . Active Member of Clubs or Organizations: Yes  . Attends Archivist Meetings: More than 4 times per year  . Marital Status: Married    Allergies:  Allergies  Allergen Reactions  . Chlorpheniramine-Pseudoeph     Hyperactive  . Pseudoephedrine     Hyperactive  . Shellfish Allergy Other (See Comments)  . Hydrocodone Itching  . Prednisone Anxiety    insomnia    Metabolic Disorder Labs: No results found for: HGBA1C, MPG No results found for: PROLACTIN No results found for: CHOL, TRIG, HDL, CHOLHDL, VLDL, LDLCALC No results found for: TSH  Therapeutic Level Labs: No results found for: LITHIUM No results found for: VALPROATE No components found for:  CBMZ  Current Medications: Current Outpatient Medications  Medication Sig Dispense Refill  . azelastine (ASTELIN) 0.1 % nasal spray azelastine 137 mcg (0.1 %) nasal spray aerosol    . Cetirizine HCl 10 MG CAPS Take by mouth.    . cyclobenzaprine (FLEXERIL) 5 MG tablet Take by mouth.    . Diclofenac Sodium (PENNSAID) 2 % SOLN Pennsaid 20 mg/gram/actuation (2 %)  topical soln in metered-dose pump  APPLY 2 PUMPS (40 MG) TO THE AFFECTED AREA BY TOPICAL ROUTE 2 TIMES PER DAY    . DULoxetine (CYMBALTA) 60 MG capsule Take by mouth.    . hydrOXYzine (ATARAX/VISTARIL) 25 MG tablet TAKE 0.5-1 TABLETS (12.5-25 MG TOTAL) BY MOUTH 2 (TWO) TIMES DAILY AS NEEDED. FOR ANXIETY ATTACK 60 tablet 1  . ibuprofen (ADVIL,MOTRIN) 200 MG tablet Take by mouth.    . Naftifine HCl 2 % GEL Apply 1 application topically 2 (two) times daily. 60 g 2  . promethazine (PHENERGAN) 25 MG tablet Take 25 mg by mouth every 6 (six) hours as needed.    . promethazine (PHENERGAN) 25 MG tablet Take by mouth.    . SUMAtriptan (IMITREX) 50 MG  tablet Take by mouth.    . triamcinolone (NASACORT) 55 MCG/ACT AERO nasal inhaler Nasal Allergy 55 mcg spray aerosol  2 SPRAYS ONCE A DAY NASALLY 30 DAYS    . VITAMIN D PO Take by mouth.    Marland Kitchen VYVANSE 20 MG CHEW Chew 1 tablet by mouth daily.    Marland Kitchen zolpidem (AMBIEN) 5 MG tablet Take 1 tablet (5 mg total) by mouth at bedtime as needed for sleep. 30 tablet 1   No current facility-administered medications for this visit.     Musculoskeletal: Strength & Muscle Tone: UTA Gait & Station: normal Patient leans: N/A  Psychiatric Specialty Exam: Review of Systems  Psychiatric/Behavioral: Negative for agitation, behavioral problems, confusion, decreased concentration, dysphoric mood, hallucinations, self-injury, sleep disturbance and suicidal ideas. The patient is not nervous/anxious and is not hyperactive.   All other systems reviewed and are negative.   There were no vitals taken for this visit.There is no height or weight on file to calculate BMI.  General Appearance: Casual  Eye Contact:  Fair  Speech:  Clear and Coherent  Volume:  Normal  Mood:  Euthymic  Affect:  Congruent  Thought Process:  Goal Directed and Descriptions of Associations: Intact  Orientation:  Full (Time, Place, and Person)  Thought Content: Logical   Suicidal Thoughts:  No   Homicidal Thoughts:  No  Memory:  Immediate;   Fair Recent;   Fair Remote;   Fair  Judgement:  Fair  Insight:  Fair  Psychomotor Activity:  Normal  Concentration:  Concentration: Fair and Attention Span: Fair  Recall:  [employer] of Knowledge: Fair  Language: Fair  Akathisia:  No  Handed:  Right  AIMS (if indicated):UTA  Assets:  Communication Skills Desire for Improvement Housing Social Support  ADL's:  Intact  Cognition: WNL  Sleep:  Fair   Screenings:   Assessment and Plan: Sara Suarez is a 44 year old Caucasian female, separated, employed, lives in [place], has a history of anxiety, depression, insomnia was evaluated by telemedicine today.  Patient is biologically predisposed given her family history.  Patient with psychosocial stressors of relationship struggles however is currently making progress.  She will continue to benefit from psychotherapy sessions and medication management.  Plan as noted below.  Plan GAD-stable Cymbalta 60 mg p.o. daily Hydroxyzine 12.5 to 25 mg p.o. twice daily as needed for anxiety attacks Continue CBT , she will establish care with a new therapist.  Insomnia-restless Discussed with patient to take Ambien 7.5 mg p.o. nightly as needed She will try the new dosage and will let writer know.  She believes the 10 mg is too much for her.  For ADHD she is currently under the care of [facility] tension specialist.  Her medications were recently changed to Vyvanse 20 mg chewable tablet in divided dosage.  Pending labs-TSH.  Follow-up in clinic in 6 to 8 weeks or sooner if needed.  I have spent atleast 20 minutes non face to face with patient today. More than 50 % of the time was spent for preparing to see the patient ( e.g., review of test, records ), ordering medications and test ,psychoeducation and supportive psychotherapy and care coordination,as well as documenting clinical information in electronic health record. This note was generated in part or  whole with voice recognition software. Voice recognition is usually quite accurate but there are transcription errors that can and very often do occur. I apologize for any typographical errors that were not detected and corrected.  Ursula Alert, MD 03/04/2020, 12:07 PM

## 2020-03-18 ENCOUNTER — Ambulatory Visit: Payer: 59 | Admitting: Licensed Clinical Social Worker

## 2020-04-20 ENCOUNTER — Other Ambulatory Visit: Payer: Self-pay

## 2020-04-20 ENCOUNTER — Encounter: Payer: Self-pay | Admitting: Psychiatry

## 2020-04-20 ENCOUNTER — Telehealth (INDEPENDENT_AMBULATORY_CARE_PROVIDER_SITE_OTHER): Payer: 59 | Admitting: Psychiatry

## 2020-04-20 DIAGNOSIS — F411 Generalized anxiety disorder: Secondary | ICD-10-CM | POA: Diagnosis not present

## 2020-04-20 DIAGNOSIS — F32A Depression, unspecified: Secondary | ICD-10-CM

## 2020-04-20 DIAGNOSIS — F5105 Insomnia due to other mental disorder: Secondary | ICD-10-CM

## 2020-04-20 DIAGNOSIS — F329 Major depressive disorder, single episode, unspecified: Secondary | ICD-10-CM | POA: Diagnosis not present

## 2020-04-20 DIAGNOSIS — F908 Attention-deficit hyperactivity disorder, other type: Secondary | ICD-10-CM

## 2020-04-20 MED ORDER — DULOXETINE HCL 20 MG PO CPEP: 20.0000 mg | ORAL_CAPSULE | Freq: Every day | ORAL | 0 refills | Status: DC

## 2020-04-20 NOTE — Progress Notes (Signed)
Provider Location : [facility] Patient Location : Home  Virtual Visit via Video Note  I connected with Sara Suarez on 04/20/20 at 11:00 AM EDT by a video enabled telemedicine application and verified that I am speaking with the correct person using two identifiers.   I discussed the limitations of evaluation and management by telemedicine and the availability of in person appointments. The patient expressed understanding and agreed to proceed.    I discussed the assessment and treatment plan with the patient. The patient was provided an opportunity to ask questions and all were answered. The patient agreed with the plan and demonstrated an understanding of the instructions.   The patient was advised to call back or seek an in-person evaluation if the symptoms worsen or if the condition fails to improve as anticipated.   [facility] MD OP Progress Note  04/20/2020 8:45 PM Sara Suarez  MRN:  [id number]  Chief Complaint:  Chief Complaint    Follow-up     HPI: Sara Suarez is a 44 year old Caucasian female, separated, lives in [place], has a history of anxiety disorder, insomnia, ADHD was evaluated by telemedicine today.  Patient today reports she has noticed mild depressive symptoms recently.  She reports since she is taking the Vyvanse as well as the Cymbalta she does not have serious symptoms however she does feel lack of motivation, some sadness and low energy on and off.  She reports she was advised by her ADHD specialist to stay compliant on the Vyvanse as prescribed and she hence has been taking it twice a day since the past 2 weeks.  That definitely seems to be helpful.  She reports she had some work-related stressors recently.  She  had to work overtime and that also seems to have an impact on her mood, her anxiety as well as depressive symptoms.  She is compliant on the Cymbalta as prescribed.  She did not change her Ambien to 7.5 mg as discussed last visit.  Sleep  continues to be restless however she has not been keeping track of her sleep at all.  She reports she tries to catch up on her sleep during the weekend.  She reports just the thought that she has to wake up in the morning to work keeps her up at night.  Patient denies any suicidality, homicidality or perceptual disturbances.  She has started CBT with a new therapist with Ms. Nathaniel Man and has been seeing her on a weekly basis.  She reports therapy sessions as beneficial.  Patient denies any other concerns today.  Visit Diagnosis:    ICD-10-CM   1. Generalized anxiety disorder  F41.1 DULoxetine (CYMBALTA) 20 MG capsule  2. Insomnia due to mental condition  F51.05   3. Attention deficit hyperactivity disorder (ADHD), other type  F90.8   4. Depressive disorder  F32.9 DULoxetine (CYMBALTA) 20 MG capsule   unspecified    Past Psychiatric History: I have reviewed past psychiatric history from my progress note on 07/09/2019.  Past trials of Cymbalta, Klonopin, Ambien  Past Medical History:  Past Medical History:  Diagnosis Date  . ADHD (attention deficit hyperactivity disorder)   . Allergy   . Anxiety   . Depression     Past Surgical History:  Procedure Laterality Date  . BREAST CYST ASPIRATION Right    neg  . ENDOSCOPIC PLANTAR FASCIOTOMY    . FOOT SURGERY    . MOUTH SURGERY    . TUBAL LIGATION  Family Psychiatric History: I have reviewed family psychiatric history from my progress note on 07/09/2019  Family History:  Family History  Problem Relation Age of Onset  . Breast cancer Mother 56  . Anxiety disorder Mother   . Breast cancer Maternal Aunt 65  . Anxiety disorder Sister   . Panic disorder Sister   . Dementia Maternal Grandmother   . Dementia Paternal Grandmother     Social History: Reviewed social history from my progress note on 07/09/2019 Social History   Socioeconomic History  . Marital status: Married    Spouse name: jon  . Number of children: 0  .  Years of education: Not on file  . Highest education level: Bachelor's degree (e.g., BA, AB, BS)  Occupational History  . Not on file  Tobacco Use  . Smoking status: Never Smoker  . Smokeless tobacco: Never Used  Substance and Sexual Activity  . Alcohol use: Never  . Drug use: Never  . Sexual activity: Not on file  Other Topics Concern  . Not on file  Social History Narrative  . Not on file   Social Determinants of Health   Financial Resource Strain: Low Risk   . Difficulty of Paying Living Expenses: Not hard at all  Food Insecurity: No Food Insecurity  . Worried About Charity fundraiser in the Last Year: Never true  . Ran Out of Food in the Last Year: Never true  Transportation Needs: No Transportation Needs  . Lack of Transportation (Medical): No  . Lack of Transportation (Non-Medical): No  Physical Activity: Inactive  . Days of Exercise per Week: 0 days  . Minutes of Exercise per Session: 0 min  Stress: Stress Concern Present  . Feeling of Stress : Rather much  Social Connections: Unknown  . Frequency of Communication with Friends and Family: Not on file  . Frequency of Social Gatherings with Friends and Family: Not on file  . Attends Religious Services: Never  . Active Member of Clubs or Organizations: Yes  . Attends Archivist Meetings: More than 4 times per year  . Marital Status: Married    Allergies:  Allergies  Allergen Reactions  . Chlorpheniramine-Pseudoeph     Hyperactive  . Pseudoephedrine     Hyperactive  . Shellfish Allergy Other (See Comments)  . Hydrocodone Itching  . Prednisone Anxiety    insomnia    Metabolic Disorder Labs: No results found for: HGBA1C, MPG No results found for: PROLACTIN No results found for: CHOL, TRIG, HDL, CHOLHDL, VLDL, LDLCALC No results found for: TSH  Therapeutic Level Labs: No results found for: LITHIUM No results found for: VALPROATE No components found for:  CBMZ  Current Medications: Current  Outpatient Medications  Medication Sig Dispense Refill  . azelastine (ASTELIN) 0.1 % nasal spray azelastine 137 mcg (0.1 %) nasal spray aerosol    . Cetirizine HCl 10 MG CAPS Take by mouth.    . cyclobenzaprine (FLEXERIL) 5 MG tablet Take by mouth.    . Diclofenac Sodium (PENNSAID) 2 % SOLN Pennsaid 20 mg/gram/actuation (2 %) topical soln in metered-dose pump  APPLY 2 PUMPS (40 MG) TO THE AFFECTED AREA BY TOPICAL ROUTE 2 TIMES PER DAY    . DULoxetine (CYMBALTA) 20 MG capsule Take 1 capsule (20 mg total) by mouth daily. 90 capsule 0  . DULoxetine (CYMBALTA) 60 MG capsule Take by mouth.    . hydrOXYzine (ATARAX/VISTARIL) 25 MG tablet TAKE 0.5-1 TABLETS (12.5-25 MG TOTAL) BY MOUTH 2 (TWO) TIMES  DAILY AS NEEDED. FOR ANXIETY ATTACK 60 tablet 1  . ibuprofen (ADVIL,MOTRIN) 200 MG tablet Take by mouth.    . Naftifine HCl 2 % GEL Apply 1 application topically 2 (two) times daily. 60 g 2  . promethazine (PHENERGAN) 25 MG tablet Take 25 mg by mouth every 6 (six) hours as needed.    . promethazine (PHENERGAN) 25 MG tablet Take by mouth.    . SUMAtriptan (IMITREX) 50 MG tablet Take by mouth.    . triamcinolone (NASACORT) 55 MCG/ACT AERO nasal inhaler Nasal Allergy 55 mcg spray aerosol  2 SPRAYS ONCE A DAY NASALLY 30 DAYS    . VITAMIN D PO Take by mouth.    Marland Kitchen VYVANSE 20 MG CHEW Chew 1 tablet by mouth daily.    Marland Kitchen zolpidem (AMBIEN) 5 MG tablet Take 1-1.5 tablets (5-7.5 mg total) by mouth at bedtime as needed for sleep. 45 tablet 1   No current facility-administered medications for this visit.     Musculoskeletal: Strength & Muscle Tone: UTA Gait & Station: normal Patient leans: N/A  Psychiatric Specialty Exam: Review of Systems  Psychiatric/Behavioral: Positive for dysphoric mood and sleep disturbance. The patient is nervous/anxious.   All other systems reviewed and are negative.   There were no vitals taken for this visit.There is no height or weight on file to calculate BMI.  General  Appearance: Casual  Eye Contact:  Fair  Speech:  Clear and Coherent  Volume:  Normal  Mood:  Anxious and Dysphoric  Affect:  Congruent  Thought Process:  Goal Directed and Descriptions of Associations: Intact  Orientation:  Full (Time, Place, and Person)  Thought Content: Logical   Suicidal Thoughts:  No  Homicidal Thoughts:  No  Memory:  Immediate;   Fair Recent;   Fair Remote;   Fair  Judgement:  Fair  Insight:  Fair  Psychomotor Activity:  Normal  Concentration:  Concentration: Fair and Attention Span: Fair  Recall:  [employer] of Knowledge: Fair  Language: Fair  Akathisia:  No  Handed:  Right  AIMS (if indicated): UTA  Assets:  Communication Skills Desire for [place] Talents/Skills Transportation Vocational/Educational  ADL's:  Intact  Cognition: WNL  Sleep:  Restless   Screenings:   Assessment and Plan: Sara Suarez is a 43 year old Caucasian female, separated, employed, lives in [place], has a history of anxiety, depression, insomnia was evaluated by telemedicine today.  She is biologically predisposed given her family history.  Patient with psychosocial stressors of relationship struggles.  Patient is currently struggling with depression, anxiety and sleep issues and will benefit from medication readjustment.  Plan as noted below.  Plan GAD-unstable Increase Cymbalta to 80 mg p.o. daily Hydroxyzine 12.5 to 25 mg p.o. twice daily as needed for anxiety attacks Continue CBT-she has established care with Ms. Nathaniel Man.  She sees a therapist on a weekly basis.  Insomnia-restless Discussed with patient to take Ambien 7.5 mg p.o. nightly as needed Discussed with patient to track her sleep, keep a sleep diary.  For ADHD-she will continue to follow-up with [place] attention specialist. Vyvanse 20 mg in divided dosage.  Pending labs-TSH  Follow-up in clinic in 6 weeks or sooner if needed.  I have spent atleast 20 minutes non  face to face with patient today. More than 50 % of the time was spent for preparing to see the patient ( e.g., review of test, records ),  ordering medications and test ,psychoeducation and supportive psychotherapy and care  coordination,as well as documenting clinical information in electronic health record. This note was generated in part or whole with voice recognition software. Voice recognition is usually quite accurate but there are transcription errors that can and very often do occur. I apologize for any typographical errors that were not detected and corrected.          Ursula Alert, MD 04/20/2020, 8:45 PM

## 2020-06-17 ENCOUNTER — Telehealth (INDEPENDENT_AMBULATORY_CARE_PROVIDER_SITE_OTHER): Payer: 59 | Admitting: Psychiatry

## 2020-06-17 ENCOUNTER — Other Ambulatory Visit: Payer: Self-pay

## 2020-06-17 ENCOUNTER — Encounter: Payer: Self-pay | Admitting: Psychiatry

## 2020-06-17 DIAGNOSIS — F908 Attention-deficit hyperactivity disorder, other type: Secondary | ICD-10-CM | POA: Diagnosis not present

## 2020-06-17 DIAGNOSIS — F411 Generalized anxiety disorder: Secondary | ICD-10-CM

## 2020-06-17 DIAGNOSIS — F5105 Insomnia due to other mental disorder: Secondary | ICD-10-CM

## 2020-06-17 NOTE — Progress Notes (Signed)
Provider Location : [facility] Patient Location : Home  Virtual Visit via Video Note  I connected with Sara Suarez on 06/17/20 at 11:00 AM EDT by a video enabled telemedicine application and verified that I am speaking with the correct person using two identifiers.   I discussed the limitations of evaluation and management by telemedicine and the availability of in person appointments. The patient expressed understanding and agreed to proceed.   I discussed the assessment and treatment plan with the patient. The patient was provided an opportunity to ask questions and all were answered. The patient agreed with the plan and demonstrated an understanding of the instructions.   The patient was advised to call back or seek an in-person evaluation if the symptoms worsen or if the condition fails to improve as anticipated.   [facility] OP Progress Note  06/17/2020 12:39 PM Sara Suarez  MRN:  [id number]  Chief Complaint:  Chief Complaint    Follow-up     HPI: Sara Suarez is a 44 year old Caucasian female, separated, lives in [place], has a history of anxiety disorder, insomnia, ADHD was evaluated by telemedicine today.  Patient today reports she has noticed improvement in her depression.  She reports she has observed that she is much better than how she was this time of the year last year.  Her husband has also observed a change in her.  She reports that Cymbalta 80 mg is beneficial.  She denies side effects.  She reports she does not have a lot of exercise currently due to the pandemic.  She does not leave her home much.  The only time she leaves her home is to walk her dogs.  She continues to work remotely.  She reports because of this sleep is restless.  She however reports the zolpidem does help her if she takes it.  She currently wants to stay on the dosage.  She is currently taking Vyvanse as prescribed by her attention specialist.  She reports that is beneficial.  She  continues to follow-up with Ms. Sara Suarez her therapist and reports therapy sessions as helpful.  Patient denies any suicidality, homicidality or perceptual disturbances.  Patient denies any side effects to her medications.  Patient denies any other concerns today.  Visit Diagnosis:    ICD-10-CM   1. Generalized anxiety disorder  F41.1   2. Insomnia due to mental condition  F51.05   3. Attention deficit hyperactivity disorder (ADHD), other type  F90.8     Past Psychiatric History: I have reviewed past psychiatric history from my progress note on 07/09/2019.  Past trials of Cymbalta, Klonopin, Ambien  Past Medical History:  Past Medical History:  Diagnosis Date  . ADHD (attention deficit hyperactivity disorder)   . Allergy   . Anxiety   . Depression     Past Surgical History:  Procedure Laterality Date  . BREAST CYST ASPIRATION Right    neg  . ENDOSCOPIC PLANTAR FASCIOTOMY    . FOOT SURGERY    . MOUTH SURGERY    . TUBAL LIGATION      Family Psychiatric History: I have reviewed family psychiatric history from my progress note on 07/09/2019  Family History:  Family History  Problem Relation Age of Onset  . Breast cancer Mother 32  . Anxiety disorder Mother   . Breast cancer Maternal Aunt 65  . Anxiety disorder Sister   . Panic disorder Sister   . Dementia Maternal Grandmother   . Dementia Paternal Grandmother  Social History: I have reviewed social history from my progress note on 07/09/2019 Social History   Socioeconomic History  . Marital status: Married    Spouse name: jon  . Number of children: 0  . Years of education: Not on file  . Highest education level: Bachelor's degree (e.g., BA, AB, BS)  Occupational History  . Not on file  Tobacco Use  . Smoking status: Never Smoker  . Smokeless tobacco: Never Used  Vaping Use  . Vaping Use: Never used  Substance and Sexual Activity  . Alcohol use: Never  . Drug use: Never  . Sexual activity: Not on  file  Other Topics Concern  . Not on file  Social History Narrative  . Not on file   Social Determinants of Health   Financial Resource Strain: Suarez Risk   . Difficulty of Paying Living Expenses: Not hard at all  Food Insecurity: No Food Insecurity  . Worried About Charity fundraiser in the Last Year: Never true  . Ran Out of Food in the Last Year: Never true  Transportation Needs: No Transportation Needs  . Lack of Transportation (Medical): No  . Lack of Transportation (Non-Medical): No  Physical Activity: Inactive  . Days of Exercise per Week: 0 days  . Minutes of Exercise per Session: 0 min  Stress: Stress Concern Present  . Feeling of Stress : Rather much  Social Connections: Unknown  . Frequency of Communication with Friends and Family: Not on file  . Frequency of Social Gatherings with Friends and Family: Not on file  . Attends Religious Services: Never  . Active Member of Clubs or Organizations: Yes  . Attends Archivist Meetings: More than 4 times per year  . Marital Status: Married    Allergies:  Allergies  Allergen Reactions  . Chlorpheniramine-Pseudoeph     Hyperactive  . Pseudoephedrine     Hyperactive  . Shellfish Allergy Other (See Comments)  . Hydrocodone Itching  . Prednisone Anxiety    insomnia    Metabolic Disorder Labs: No results found for: HGBA1C, MPG No results found for: PROLACTIN No results found for: CHOL, TRIG, HDL, CHOLHDL, VLDL, LDLCALC No results found for: TSH  Therapeutic Level Labs: No results found for: LITHIUM No results found for: VALPROATE No components found for:  CBMZ  Current Medications: Current Outpatient Medications  Medication Sig Dispense Refill  . azelastine (ASTELIN) 0.1 % nasal spray azelastine 137 mcg (0.1 %) nasal spray aerosol    . Cetirizine HCl 10 MG CAPS Take by mouth.    . cyclobenzaprine (FLEXERIL) 5 MG tablet Take by mouth.    . Diclofenac Sodium (PENNSAID) 2 % SOLN Pennsaid 20  mg/gram/actuation (2 %) topical soln in metered-dose pump  APPLY 2 PUMPS (40 MG) TO THE AFFECTED AREA BY TOPICAL ROUTE 2 TIMES PER DAY    . DULoxetine (CYMBALTA) 20 MG capsule Take 1 capsule (20 mg total) by mouth daily. 90 capsule 0  . DULoxetine (CYMBALTA) 60 MG capsule Take by mouth.    . hydrOXYzine (ATARAX/VISTARIL) 25 MG tablet TAKE 0.5-1 TABLETS (12.5-25 MG TOTAL) BY MOUTH 2 (TWO) TIMES DAILY AS NEEDED. FOR ANXIETY ATTACK 60 tablet 1  . ibuprofen (ADVIL,MOTRIN) 200 MG tablet Take by mouth.    . Naftifine HCl 2 % GEL Apply 1 application topically 2 (two) times daily. 60 g 2  . promethazine (PHENERGAN) 25 MG tablet Take 25 mg by mouth every 6 (six) hours as needed.    . promethazine (  PHENERGAN) 25 MG tablet Take by mouth.    . SUMAtriptan (IMITREX) 50 MG tablet Take by mouth.    . triamcinolone (NASACORT) 55 MCG/ACT AERO nasal inhaler Nasal Allergy 55 mcg spray aerosol  2 SPRAYS ONCE A DAY NASALLY 30 DAYS    . VITAMIN D PO Take by mouth.    Marland Kitchen VYVANSE 20 MG CHEW Chew 1 tablet by mouth daily.    Marland Kitchen zolpidem (AMBIEN) 5 MG tablet Take 1-1.5 tablets (5-7.5 mg total) by mouth at bedtime as needed for sleep. 45 tablet 1   No current facility-administered medications for this visit.     Musculoskeletal: Strength & Muscle Tone: UTA Gait & Station: normal Patient leans: N/A  Psychiatric Specialty Exam: Review of Systems  Psychiatric/Behavioral: Positive for sleep disturbance (Improving). The patient is nervous/anxious.   All other systems reviewed and are negative.   There were no vitals taken for this visit.There is no height or weight on file to calculate BMI.  General Appearance: Casual  Eye Contact:  Fair  Speech:  Clear and Coherent  Volume:  Normal  Mood:  Anxious improving  Affect:  Congruent  Thought Process:  Goal Directed and Descriptions of Associations: Intact  Orientation:  Full (Time, Place, and Person)  Thought Content: Logical   Suicidal Thoughts:  No  Homicidal  Thoughts:  No  Memory:  Immediate;   Fair Recent;   Fair Remote;   Fair  Judgement:  Fair  Insight:  Fair  Psychomotor Activity:  Normal  Concentration:  Concentration: Fair and Attention Span: Fair  Recall:  [employer] of Knowledge: Fair  Language: Fair  Akathisia:  No  Handed:  Right  AIMS (if indicated): UTA  Assets:  Communication Skills Desire for Improvement Housing Social Support  ADL's:  Intact  Cognition: WNL  Sleep:  improving   Screenings:   Assessment and Plan: Mallie Giambra is a 44 year old Caucasian female, separated, employed, lives in [place], has a history of anxiety, depression, insomnia was evaluated by telemedicine today.  Patient is biologically predisposed given her family history.  Patient with psychosocial stressors of relationship struggles the current pandemic.  She however is currently making progress.  Plan as noted below.  Plan GAD-improving Cymbalta 80 mg p.o. daily Hydroxyzine 12.5 to 25 mg p.o. twice daily as needed Continue CBT with Sara Suarez.  Insomnia-improving Ambien 7.5 mg p.o. nightly as needed Discussed with patient to have more exercise, leisure activities.  ADHD-she will continue to follow-up with [place] attention specialist Vyvanse 20 mg p.o. daily   Patient agrees to get her TSH labs done at her primary care doctor's office.  Follow-up in clinic in 6 to 8 weeks or sooner if needed.  I have spent atleast 20 minutes non face to face with patient today. More than 50 % of the time was spent for preparing to see the patient ( e.g., review of test, records ), ordering medications and test ,psychoeducation and supportive psychotherapy and care coordination,as well as documenting clinical information in electronic health record. This note was generated in part or whole with voice recognition software. Voice recognition is usually quite accurate but there are transcription errors that can and very often do occur. I  apologize for any typographical errors that were not detected and corrected.      Ursula Alert, MD 06/17/2020, 12:39 PM

## 2020-07-12 ENCOUNTER — Other Ambulatory Visit: Payer: Self-pay | Admitting: Psychiatry

## 2020-07-12 DIAGNOSIS — F411 Generalized anxiety disorder: Secondary | ICD-10-CM

## 2020-07-12 DIAGNOSIS — F32A Depression, unspecified: Secondary | ICD-10-CM

## 2020-08-10 ENCOUNTER — Other Ambulatory Visit: Payer: Self-pay | Admitting: Internal Medicine

## 2020-08-23 ENCOUNTER — Other Ambulatory Visit: Payer: Self-pay | Admitting: Internal Medicine

## 2020-08-23 DIAGNOSIS — Z1231 Encounter for screening mammogram for malignant neoplasm of breast: Secondary | ICD-10-CM

## 2020-08-24 ENCOUNTER — Telehealth (INDEPENDENT_AMBULATORY_CARE_PROVIDER_SITE_OTHER): Payer: 59 | Admitting: Psychiatry

## 2020-08-24 ENCOUNTER — Encounter: Payer: Self-pay | Admitting: Psychiatry

## 2020-08-24 ENCOUNTER — Other Ambulatory Visit: Payer: Self-pay

## 2020-08-24 DIAGNOSIS — F411 Generalized anxiety disorder: Secondary | ICD-10-CM | POA: Diagnosis not present

## 2020-08-24 DIAGNOSIS — F908 Attention-deficit hyperactivity disorder, other type: Secondary | ICD-10-CM | POA: Diagnosis not present

## 2020-08-24 DIAGNOSIS — F5105 Insomnia due to other mental disorder: Secondary | ICD-10-CM | POA: Diagnosis not present

## 2020-08-24 NOTE — Progress Notes (Signed)
Provider Location : [facility] Patient Location : Home  Participants: Patient , Provider  Virtual Visit via Telephone Note  I connected with Sara Suarez on 08/24/20 at 10:30 AM EDT by telephone and verified that I am speaking with the correct person using two identifiers.   I discussed the limitations, risks, security and privacy concerns of performing an evaluation and management service by telephone and the availability of in person appointments. I also discussed with the patient that there may be a patient responsible charge related to this service. The patient expressed understanding and agreed to proceed.   I discussed the assessment and treatment plan with the patient. The patient was provided an opportunity to ask questions and all were answered. The patient agreed with the plan and demonstrated an understanding of the instructions.   The patient was advised to call back or seek an in-person evaluation if the symptoms worsen or if the condition fails to improve as anticipated.     [facility] MD OP Progress Note  08/24/2020 11:46 AM Sara Suarez  MRN:  [id number]  Chief Complaint:  Chief Complaint    Follow-up     HPI: Sara Suarez is a 44 year old Caucasian female, separated, lives in [place], has a history of anxiety disorder, insomnia, ADHD was evaluated by phone today.  Patient today reports she is currently struggling with some anxiety during the day however she takes the hydroxyzine as needed which helps.  Patient reports she continues to take the Cymbalta 80 mg and denies side effects.  She does report sleep problems.  She reports she does not take the Ambien often however when she takes it she is able to sleep okay.  She reports she tosses and turns all night long and even when she has a full night sleep she wakes up feeling unrested.  She reports she would like to go in for a sleep study and has an appointment scheduled.  She continues to take the Vyvanse as  prescribed for her ADHD symptoms and continues to follow-up with attention specialist.  Patient denies any suicidality, homicidality or perceptual disturbances.  She had recent thyroid hormone abnormalities and agrees to follow-up with her primary care provider for the same.  She denies any other concerns today.    Visit Diagnosis:    ICD-10-CM   1. Generalized anxiety disorder  F41.1   2. Insomnia due to mental condition  F51.05   3. Attention deficit hyperactivity disorder (ADHD), other type  F90.8     Past Psychiatric History: I have reviewed past psychiatric history from my progress note on 07/09/2019.  Past trials of Cymbalta, Klonopin, Ambien  Past Medical History:  Past Medical History:  Diagnosis Date  . ADHD (attention deficit hyperactivity disorder)   . Allergy   . Anxiety   . Depression     Past Surgical History:  Procedure Laterality Date  . BREAST CYST ASPIRATION Right    neg  . ENDOSCOPIC PLANTAR FASCIOTOMY    . FOOT SURGERY    . MOUTH SURGERY    . TUBAL LIGATION      Family Psychiatric History: I have reviewed family psychiatric history from my progress note on 07/09/2019  Family History:  Family History  Problem Relation Age of Onset  . Breast cancer Mother 37  . Anxiety disorder Mother   . Breast cancer Maternal Aunt 65  . Anxiety disorder Sister   . Panic disorder Sister   . Dementia Maternal Grandmother   . Dementia Paternal  Grandmother     Social History: Reviewed social history from my progress note on 07/09/2019 Social History   Socioeconomic History  . Marital status: Married    Spouse name: jon  . Number of children: 0  . Years of education: Not on file  . Highest education level: Bachelor's degree (e.g., BA, AB, BS)  Occupational History  . Not on file  Tobacco Use  . Smoking status: Never Smoker  . Smokeless tobacco: Never Used  Vaping Use  . Vaping Use: Never used  Substance and Sexual Activity  . Alcohol use: Never  . Drug  use: Never  . Sexual activity: Not on file  Other Topics Concern  . Not on file  Social History Narrative  . Not on file   Social Determinants of Health   Financial Resource Strain:   . Difficulty of Paying Living Expenses: Not on file  Food Insecurity:   . Worried About Charity fundraiser in the Last Year: Not on file  . Ran Out of Food in the Last Year: Not on file  Transportation Needs:   . Lack of Transportation (Medical): Not on file  . Lack of Transportation (Non-Medical): Not on file  Physical Activity:   . Days of Exercise per Week: Not on file  . Minutes of Exercise per Session: Not on file  Stress:   . Feeling of Stress : Not on file  Social Connections:   . Frequency of Communication with Friends and Family: Not on file  . Frequency of Social Gatherings with Friends and Family: Not on file  . Attends Religious Services: Not on file  . Active Member of Clubs or Organizations: Not on file  . Attends Archivist Meetings: Not on file  . Marital Status: Not on file    Allergies:  Allergies  Allergen Reactions  . Atomoxetine Other (See Comments)    Heartburn  . Guanfacine Other (See Comments)    drowsiness  . Chlorpheniramine-Pseudoeph     Hyperactive  . Pseudoephedrine     Hyperactive  . Shellfish Allergy Other (See Comments)  . Hydrocodone Itching  . Prednisone Anxiety    insomnia    Metabolic Disorder Labs: No results found for: HGBA1C, MPG No results found for: PROLACTIN No results found for: CHOL, TRIG, HDL, CHOLHDL, VLDL, LDLCALC No results found for: TSH  Therapeutic Level Labs: No results found for: LITHIUM No results found for: VALPROATE No components found for:  CBMZ  Current Medications: Current Outpatient Medications  Medication Sig Dispense Refill  . albuterol (VENTOLIN HFA) 108 (90 Base) MCG/ACT inhaler Inhale into the lungs.    Marland Kitchen azelastine (ASTELIN) 0.1 % nasal spray azelastine 137 mcg (0.1 %) nasal spray aerosol    .  Cetirizine HCl 10 MG CAPS Take by mouth.    . cyclobenzaprine (FLEXERIL) 5 MG tablet Take by mouth.    . Diclofenac Sodium (PENNSAID) 2 % SOLN Pennsaid 20 mg/gram/actuation (2 %) topical soln in metered-dose pump  APPLY 2 PUMPS (40 MG) TO THE AFFECTED AREA BY TOPICAL ROUTE 2 TIMES PER DAY    . DULoxetine (CYMBALTA) 20 MG capsule TAKE 1 CAPSULE BY MOUTH EVERY DAY (TAKE WITH THE 60 MG) 90 capsule 0  . DULoxetine (CYMBALTA) 60 MG capsule Take by mouth.    . hydrOXYzine (ATARAX/VISTARIL) 25 MG tablet TAKE 0.5-1 TABLETS (12.5-25 MG TOTAL) BY MOUTH 2 (TWO) TIMES DAILY AS NEEDED. FOR ANXIETY ATTACK 60 tablet 1  . ibuprofen (ADVIL,MOTRIN) 200 MG tablet Take  by mouth.    . Naftifine HCl 2 % GEL Apply 1 application topically 2 (two) times daily. 60 g 2  . promethazine (PHENERGAN) 25 MG tablet Take 25 mg by mouth every 6 (six) hours as needed.    . promethazine (PHENERGAN) 25 MG tablet Take by mouth.    . SUMAtriptan (IMITREX) 50 MG tablet Take by mouth.    . triamcinolone (NASACORT) 55 MCG/ACT AERO nasal inhaler Nasal Allergy 55 mcg spray aerosol  2 SPRAYS ONCE A DAY NASALLY 30 DAYS    . VITAMIN D PO Take by mouth.    Marland Kitchen VYVANSE 20 MG CHEW Chew 1 tablet by mouth daily.    Marland Kitchen zolpidem (AMBIEN) 5 MG tablet Take 1-1.5 tablets (5-7.5 mg total) by mouth at bedtime as needed for sleep. 45 tablet 1   No current facility-administered medications for this visit.     Musculoskeletal: Strength & Muscle Tone: UTA Gait & Station: UTA Patient leans: N/A  Psychiatric Specialty Exam: Review of Systems  Psychiatric/Behavioral: Positive for sleep disturbance. The patient is nervous/anxious.   All other systems reviewed and are negative.   There were no vitals taken for this visit.There is no height or weight on file to calculate BMI.  General Appearance: UTA  Eye Contact:  UTA  Speech:  Normal Rate  Volume:  Normal  Mood:  Anxious  Affect:  UTA  Thought Process:  Goal Directed and Descriptions of  Associations: Intact  Orientation:  Full (Time, Place, and Person)  Thought Content: Logical   Suicidal Thoughts:  No  Homicidal Thoughts:  No  Memory:  Immediate;   Fair Recent;   Fair Remote;   Fair  Judgement:  Fair  Insight:  Fair  Psychomotor Activity:  UTA  Concentration:  Concentration: Fair and Attention Span: Fair  Recall:  [employer] of Knowledge: Fair  Language: Fair  Akathisia:  No  Handed:  Right  AIMS (if indicated): UTA  Assets:  Communication Skills Desire for Improvement Housing Social Support  ADL's:  Intact  Cognition: WNL  Sleep:  Poor   Screenings:   Assessment and Plan: Sara Suarez is a 44 year old Caucasian female, separated, employed, lives in [place], has a history of anxiety, depression, insomnia was evaluated by phone today.  Patient is biologically predisposed given her family history.  Patient with psychosocial stressors of relationship struggles, current pandemic.  Patient does have mild anxiety symptoms as well as sleep issues.  Plan as noted below.  Plan GAD-improving Cymbalta 80 mg p.o. daily Hydroxyzine 12.5 to 25 mg p.o. twice daily as needed Continue CBT with Ms. Sonya Carter  Insomnia-restless Ambien 7.5 mg p.o. nightly as needed.  She does not take it often. She is scheduled for sleep study with sleep clinic.  ADHD-he continues to follow-up with [place] attention specialist. Vyvanse 20 mg p.o. daily   Patient to follow-up with her primary care provider for abnormal thyroid hormone levels.  Follow-up in clinic in 2 to 3 months or sooner if needed.  I have spent atleast 20 minutes non face to face with patient today. More than 50 % of the time was spent for preparing to see the patient ( e.g., review of test, records ), ordering medications and test ,psychoeducation and supportive psychotherapy and care coordination,as well as documenting clinical information in electronic health record. This note was generated in part or  whole with voice recognition software. Voice recognition is usually quite accurate but there are transcription errors that can and  very often do occur. I apologize for any typographical errors that were not detected and corrected.       Ursula Alert, MD 08/24/2020, 11:46 AM

## 2020-09-22 ENCOUNTER — Other Ambulatory Visit: Payer: Self-pay

## 2020-09-22 ENCOUNTER — Ambulatory Visit
Admission: RE | Admit: 2020-09-22 | Discharge: 2020-09-22 | Disposition: A | Discharge status: Home / Self Care | Payer: 59 | Source: Ambulatory Visit | Attending: Internal Medicine | Admitting: Internal Medicine

## 2020-09-22 DIAGNOSIS — Z1231 Encounter for screening mammogram for malignant neoplasm of breast: Secondary | ICD-10-CM | POA: Diagnosis present

## 2020-10-15 ENCOUNTER — Other Ambulatory Visit: Payer: Self-pay | Admitting: Psychiatry

## 2020-10-15 DIAGNOSIS — F411 Generalized anxiety disorder: Secondary | ICD-10-CM

## 2020-10-15 DIAGNOSIS — F32A Depression, unspecified: Secondary | ICD-10-CM

## 2020-11-25 DIAGNOSIS — H1045 Other chronic allergic conjunctivitis: Secondary | ICD-10-CM | POA: Insufficient documentation

## 2020-11-25 DIAGNOSIS — Z91013 Allergy to seafood: Secondary | ICD-10-CM | POA: Insufficient documentation

## 2020-12-01 ENCOUNTER — Encounter: Payer: Self-pay | Admitting: Psychiatry

## 2020-12-01 ENCOUNTER — Telehealth (INDEPENDENT_AMBULATORY_CARE_PROVIDER_SITE_OTHER): Payer: 59 | Admitting: Psychiatry

## 2020-12-01 ENCOUNTER — Other Ambulatory Visit: Payer: Self-pay

## 2020-12-01 DIAGNOSIS — F411 Generalized anxiety disorder: Secondary | ICD-10-CM

## 2020-12-01 DIAGNOSIS — F908 Attention-deficit hyperactivity disorder, other type: Secondary | ICD-10-CM | POA: Diagnosis not present

## 2020-12-01 DIAGNOSIS — J3081 Allergic rhinitis due to animal (cat) (dog) hair and dander: Secondary | ICD-10-CM | POA: Insufficient documentation

## 2020-12-01 DIAGNOSIS — F5105 Insomnia due to other mental disorder: Secondary | ICD-10-CM

## 2020-12-01 DIAGNOSIS — J301 Allergic rhinitis due to pollen: Secondary | ICD-10-CM | POA: Insufficient documentation

## 2020-12-01 MED ORDER — DULOXETINE HCL 60 MG PO CPEP
60.0000 mg | ORAL_CAPSULE | Freq: Every day | ORAL | 1 refills | Status: DC
Start: 2020-12-01 — End: 2021-09-28

## 2020-12-01 NOTE — Progress Notes (Signed)
Virtual Visit via Video Note  I connected with Sara Suarez on 12/01/20 at 10:30 AM EST by a video enabled telemedicine application and verified that I am speaking with the correct person using two identifiers.  Location Provider Location : [facility] Patient Location : Home  Participants: Patient , Provider   I discussed the limitations of evaluation and management by telemedicine and the availability of in person appointments. The patient expressed understanding and agreed to proceed.    I discussed the assessment and treatment plan with the patient. The patient was provided an opportunity to ask questions and all were answered. The patient agreed with the plan and demonstrated an understanding of the instructions.   The patient was advised to call back or seek an in-person evaluation if the symptoms worsen or if the condition fails to improve as anticipated.   Ali Chukson MD OP Progress Note  12/01/2020 11:01 AM Sara Suarez  MRN:  [id number]  Chief Complaint:  Chief Complaint    Follow-up     HPI: Sara Suarez is a 45 year old Caucasian female, separated, lives in [place], has a history of GAD, insomnia, ADHD, was evaluated by telemedicine today.  Patient today reports she is currently struggling with her attention and focus.  She reports she has not received her Vyvanse for this month since there has been an insurance problem.  She reports she has has been struggling at work.  She is trying to get that corrected with the health insurance agency.  Patient reports she is coping okay with her anxiety and the current dosage of Cymbalta as effective.  She is compliant on the Cymbalta.  She does struggle with sleep since she reports she likely has sleep apnea.  She reports she was schedule for a sleep study however due to health insurance plan issues that had to be changed to February.  She is currently waiting for the same.  She continues to take Ambien as prescribed which helps  to some extent.  Patient denies any suicidality, homicidality or perceptual disturbances.  She and her husband are currently separated however they are working on their relationship, currently following up with a Social worker.  She reports that is going in the right direction since they are getting along better now.  Patient denies any other concerns today.  Visit Diagnosis:    ICD-10-CM   1. Generalized anxiety disorder  F41.1 DULoxetine (CYMBALTA) 60 MG capsule  2. Insomnia due to mental condition  F51.05   3. Attention deficit hyperactivity disorder (ADHD), other type  F90.8     Past Psychiatric History: I have reviewed past psychiatric history from my progress note on 07/09/2019.  Past trials of Cymbalta, Klonopin, Ambien  Past Medical History:  Past Medical History:  Diagnosis Date  . ADHD (attention deficit hyperactivity disorder)   . Allergy   . Anxiety   . Depression     Past Surgical History:  Procedure Laterality Date  . BREAST CYST ASPIRATION Right    neg  . ENDOSCOPIC PLANTAR FASCIOTOMY    . FOOT SURGERY    . MOUTH SURGERY    . TUBAL LIGATION      Family Psychiatric History: I have reviewed family psychiatric history from my progress note on 07/09/2019  Family History:  Family History  Problem Relation Age of Onset  . Breast cancer Mother 64  . Anxiety disorder Mother   . Breast cancer Maternal Aunt 65  . Anxiety disorder Sister   . Panic disorder Sister   .  Dementia Maternal Grandmother   . Dementia Paternal Grandmother     Social History: I have reviewed social history from my progress note on 07/09/2019 Social History   Socioeconomic History  . Marital status: Married    Spouse name: jon  . Number of children: 0  . Years of education: Not on file  . Highest education level: Bachelor's degree (e.g., BA, AB, BS)  Occupational History  . Not on file  Tobacco Use  . Smoking status: Never Smoker  . Smokeless tobacco: Never Used  Vaping Use  . Vaping  Use: Never used  Substance and Sexual Activity  . Alcohol use: Never  . Drug use: Never  . Sexual activity: Not on file  Other Topics Concern  . Not on file  Social History Narrative  . Not on file   Social Determinants of Health   Financial Resource Strain: Not on file  Food Insecurity: Not on file  Transportation Needs: Not on file  Physical Activity: Not on file  Stress: Not on file  Social Connections: Not on file    Allergies:  Allergies  Allergen Reactions  . Atomoxetine Other (See Comments)    Heartburn  . Guanfacine Other (See Comments)    drowsiness  . Chlorpheniramine-Pseudoeph     Hyperactive  . Pseudoephedrine Other (See Comments)    Hyperactive  . Shellfish Allergy Other (See Comments)  . Hydrocodone Itching  . Prednisone Anxiety    insomnia    Metabolic Disorder Labs: No results found for: HGBA1C, MPG No results found for: PROLACTIN No results found for: CHOL, TRIG, HDL, CHOLHDL, VLDL, LDLCALC No results found for: TSH  Therapeutic Level Labs: No results found for: LITHIUM No results found for: VALPROATE No components found for:  CBMZ  Current Medications: Current Outpatient Medications  Medication Sig Dispense Refill  . Lisdexamfetamine Dimesylate (VYVANSE) 30 MG CHEW     . albuterol (VENTOLIN HFA) 108 (90 Base) MCG/ACT inhaler Inhale into the lungs.    Marland Kitchen azelastine (ASTELIN) 0.1 % nasal spray azelastine 137 mcg (0.1 %) nasal spray aerosol    . Cetirizine HCl 10 MG CAPS Take by mouth.    . cyclobenzaprine (FLEXERIL) 5 MG tablet Take by mouth.    . Diclofenac Sodium (PENNSAID) 2 % SOLN Pennsaid 20 mg/gram/actuation (2 %) topical soln in metered-dose pump  APPLY 2 PUMPS (40 MG) TO THE AFFECTED AREA BY TOPICAL ROUTE 2 TIMES PER DAY    . DULoxetine (CYMBALTA) 20 MG capsule TAKE 1 CAPSULE BY MOUTH EVERY DAY (TAKE WITH THE 60 MG) 90 capsule 0  . DULoxetine (CYMBALTA) 60 MG capsule Take 1 capsule (60 mg total) by mouth daily. Combine daily with 20 mg  daily 90 capsule 1  . EPINEPHrine (AUVI-Q) 0.3 mg/0.3 mL IJ SOAJ injection See admin instructions.    . hydrOXYzine (ATARAX/VISTARIL) 25 MG tablet TAKE 0.5-1 TABLETS (12.5-25 MG TOTAL) BY MOUTH 2 (TWO) TIMES DAILY AS NEEDED. FOR ANXIETY ATTACK 60 tablet 1  . ibuprofen (ADVIL,MOTRIN) 200 MG tablet Take by mouth.    Marland Kitchen ketotifen (ZADITOR) 0.025 % ophthalmic solution 1 drop into affected eye    . Naftifine HCl 2 % GEL Apply 1 application topically 2 (two) times daily. 60 g 2  . promethazine (PHENERGAN) 25 MG tablet Take 25 mg by mouth every 6 (six) hours as needed.    . promethazine (PHENERGAN) 25 MG tablet Take by mouth.    . SUMAtriptan (IMITREX) 50 MG tablet Take by mouth.    Marland Kitchen  triamcinolone (NASACORT) 55 MCG/ACT AERO nasal inhaler Nasal Allergy 55 mcg spray aerosol  2 SPRAYS ONCE A DAY NASALLY 30 DAYS    . VITAMIN D PO Take by mouth.    Marland Kitchen VYVANSE 20 MG CHEW Chew 1 tablet by mouth daily.    Marland Kitchen VYVANSE 30 MG CHEW Chew 1 tablet by mouth every morning.    . zolpidem (AMBIEN) 5 MG tablet Take 1-1.5 tablets (5-7.5 mg total) by mouth at bedtime as needed for sleep. 45 tablet 1   No current facility-administered medications for this visit.     Musculoskeletal: Strength & Muscle Tone: UTA Gait & Station: normal Patient leans: N/A  Psychiatric Specialty Exam: Review of Systems  Psychiatric/Behavioral: Positive for sleep disturbance. The patient is nervous/anxious.   All other systems reviewed and are negative.   There were no vitals taken for this visit.There is no height or weight on file to calculate BMI.  General Appearance: Casual  Eye Contact:  Fair  Speech:  Clear and Coherent  Volume:  Normal  Mood:  Anxious  Affect:  Congruent  Thought Process:  Goal Directed and Descriptions of Associations: Intact  Orientation:  Full (Time, Place, and Person)  Thought Content: Logical   Suicidal Thoughts:  No  Homicidal Thoughts:  No  Memory:  Immediate;   Fair Recent;   Fair Remote;   Fair   Judgement:  Fair  Insight:  Fair  Psychomotor Activity:  Normal  Concentration:  Concentration: Fair and Attention Span: Good  Recall:  [employer] of Knowledge: Fair  Language: Fair  Akathisia:  No  Handed:  Right  AIMS (if indicated): UTA  Assets:  Communication Skills Desire for [place] Talents/Skills Transportation Vocational/Educational  ADL's:  Intact  Cognition: WNL  Sleep:  Restless   Screenings:   Assessment and Plan: Sara Suarez is a 45 year old Caucasian female, separated, employed, lives in [place], has a history of GAD, insomnia, was evaluated by telemedicine today.  She is biologically predisposed given her family history.  Patient with psychosocial stressors of relationship struggles, current pandemic and her own health issues.  Patient will continue to benefit from the following plan.  Plan GAD-stable Cymbalta 80 mg p.o. daily Hydroxyzine 12.5-25 mg p.o. twice daily as needed Continue CBT with Ms. Nathaniel Man  Insomnia-restless Ambien 7.5 mg p.o. nightly as needed Sleep study pending.  ADHD-she continues to follow-up with [place] attention specialist. Vyvanse 30 mg p.o. daily.  Patient to continue to follow-up with her primary care provider for abnormal thyroid hormone levels.  Follow-up in clinic in 2 to 3 months or sooner if needed.  I have spent atleast 20 minutes face to face by video with patient today. More than 50 % of the time was spent for preparing to see the patient ( e.g., review of test, records ),ordering medications and test ,psychoeducation and supportive psychotherapy and care coordination,as well as documenting clinical information in electronic health record. This note was generated in part or whole with voice recognition software. Voice recognition is usually quite accurate but there are transcription errors that can and very often do occur. I apologize for any typographical errors that were not  detected and corrected.     Ursula Alert, MD 12/02/2020, 8:30 AM

## 2020-12-28 DIAGNOSIS — G4733 Obstructive sleep apnea (adult) (pediatric): Secondary | ICD-10-CM | POA: Insufficient documentation

## 2021-01-16 ENCOUNTER — Other Ambulatory Visit: Payer: Self-pay | Admitting: Psychiatry

## 2021-01-16 DIAGNOSIS — F411 Generalized anxiety disorder: Secondary | ICD-10-CM

## 2021-01-16 DIAGNOSIS — F329 Major depressive disorder, single episode, unspecified: Secondary | ICD-10-CM

## 2021-01-16 DIAGNOSIS — F32A Depression, unspecified: Secondary | ICD-10-CM

## 2021-02-01 ENCOUNTER — Telehealth (INDEPENDENT_AMBULATORY_CARE_PROVIDER_SITE_OTHER): Payer: 59 | Admitting: Psychiatry

## 2021-02-01 ENCOUNTER — Encounter: Payer: Self-pay | Admitting: Psychiatry

## 2021-02-01 ENCOUNTER — Other Ambulatory Visit: Payer: Self-pay

## 2021-02-01 DIAGNOSIS — F908 Attention-deficit hyperactivity disorder, other type: Secondary | ICD-10-CM

## 2021-02-01 DIAGNOSIS — F411 Generalized anxiety disorder: Secondary | ICD-10-CM | POA: Diagnosis not present

## 2021-02-01 DIAGNOSIS — F5105 Insomnia due to other mental disorder: Secondary | ICD-10-CM

## 2021-02-01 NOTE — Progress Notes (Signed)
Virtual Visit via Video Note  I connected with Sara Suarez on 02/01/21 at 10:30 AM EDT by a video enabled telemedicine application and verified that I am speaking with the correct person using two identifiers.  Location Provider Location : [facility] Patient Location : Home  Participants: Patient , Provider    I discussed the limitations of evaluation and management by telemedicine and the availability of in person appointments. The patient expressed understanding and agreed to proceed.    I discussed the assessment and treatment plan with the patient. The patient was provided an opportunity to ask questions and all were answered. The patient agreed with the plan and demonstrated an understanding of the instructions.   The patient was advised to call back or seek an in-person evaluation if the symptoms worsen or if the condition fails to improve as anticipated.   Ackerman MD OP Progress Note  02/01/2021 11:03 AM Sara Suarez  MRN:  [id number]  Chief Complaint:  Chief Complaint    Follow-up; Anxiety     HPI: Sara Suarez is a 45 year old Caucasian female, separated, lives in [place], has a history of GAD, insomnia, ADHD was evaluated by telemedicine today.  Patient today reports she recently returned from a 10-day vacation in [place].  She reports she had a very good time with her spouse.  She reports she was able to disconnect from all devices as well as her work.  She really enjoyed her time there.  She reports after 2 years of working remotely and staying home all day this was a good break for her.  Patient reports she has not had any significant depression symptoms.  She did feel anxious during the days that led up to her vacation.  She however reports she currently does not have any anxiety symptoms.  She is sleeping well.  She had sleep study done and has to go in for CPAP device.  She was diagnosed with OSA.  She is compliant on her medications as prescribed.  She  rarely takes the Ambien.  Denies side effects.  Patient denies any suicidality, homicidality or perceptual disturbances.  Continues to follow-up with her therapist every 4 weeks or so.  Patient denies any other concerns today.  Visit Diagnosis:    ICD-10-CM   1. Generalized anxiety disorder  F41.1   2. Insomnia due to mental condition  F51.05   3. Attention deficit hyperactivity disorder (ADHD), other type  F90.8     Past Psychiatric History: I have reviewed past psychiatric history from my progress note on 07/09/2019.  Past trials of Cymbalta, Klonopin, Ambien  Past Medical History:  Past Medical History:  Diagnosis Date  . ADHD (attention deficit hyperactivity disorder)   . Allergy   . Anxiety   . Depression     Past Surgical History:  Procedure Laterality Date  . BREAST CYST ASPIRATION Right    neg  . ENDOSCOPIC PLANTAR FASCIOTOMY    . FOOT SURGERY    . MOUTH SURGERY    . TUBAL LIGATION      Family Psychiatric History: I have reviewed family psychiatric history from my progress note on 07/09/2019  Family History:  Family History  Problem Relation Age of Onset  . Breast cancer Mother 20  . Anxiety disorder Mother   . Breast cancer Maternal Aunt 65  . Anxiety disorder Sister   . Panic disorder Sister   . Dementia Maternal Grandmother   . Dementia Paternal Grandmother     Social History:  I have reviewed social history from my progress note on 07/09/2019 Social History   Socioeconomic History  . Marital status: Married    Spouse name: jon  . Number of children: 0  . Years of education: Not on file  . Highest education level: Bachelor's degree (e.g., BA, AB, BS)  Occupational History  . Not on file  Tobacco Use  . Smoking status: Never Smoker  . Smokeless tobacco: Never Used  Vaping Use  . Vaping Use: Never used  Substance and Sexual Activity  . Alcohol use: Never  . Drug use: Never  . Sexual activity: Not on file  Other Topics Concern  . Not on file   Social History Narrative  . Not on file   Social Determinants of Health   Financial Resource Strain: Not on file  Food Insecurity: Not on file  Transportation Needs: Not on file  Physical Activity: Not on file  Stress: Not on file  Social Connections: Not on file    Allergies:  Allergies  Allergen Reactions  . Atomoxetine Other (See Comments)    Heartburn  . Guanfacine Other (See Comments)    drowsiness  . Chlorpheniramine-Pseudoeph     Hyperactive  . Pseudoephedrine Other (See Comments)    Hyperactive  . Shellfish Allergy Other (See Comments)  . Hydrocodone Itching  . Prednisone Anxiety    insomnia    Metabolic Disorder Labs: No results found for: HGBA1C, MPG No results found for: PROLACTIN No results found for: CHOL, TRIG, HDL, CHOLHDL, VLDL, LDLCALC No results found for: TSH  Therapeutic Level Labs: No results found for: LITHIUM No results found for: VALPROATE No components found for:  CBMZ  Current Medications: Current Outpatient Medications  Medication Sig Dispense Refill  . albuterol (VENTOLIN HFA) 108 (90 Base) MCG/ACT inhaler Inhale into the lungs.    Marland Kitchen azelastine (ASTELIN) 0.1 % nasal spray azelastine 137 mcg (0.1 %) nasal spray aerosol    . Cetirizine HCl 10 MG CAPS Take by mouth.    . cyclobenzaprine (FLEXERIL) 5 MG tablet Take by mouth.    . Diclofenac Sodium (PENNSAID) 2 % SOLN Pennsaid 20 mg/gram/actuation (2 %) topical soln in metered-dose pump  APPLY 2 PUMPS (40 MG) TO THE AFFECTED AREA BY TOPICAL ROUTE 2 TIMES PER DAY    . DULoxetine (CYMBALTA) 20 MG capsule TAKE 1 CAPSULE BY MOUTH EVERY DAY (TAKE WITH THE 60 MG) 90 capsule 0  . DULoxetine (CYMBALTA) 60 MG capsule Take 1 capsule (60 mg total) by mouth daily. Combine daily with 20 mg daily 90 capsule 1  . EPINEPHrine (AUVI-Q) 0.3 mg/0.3 mL IJ SOAJ injection See admin instructions.    . hydrOXYzine (ATARAX/VISTARIL) 25 MG tablet TAKE 0.5-1 TABLETS (12.5-25 MG TOTAL) BY MOUTH 2 (TWO) TIMES DAILY AS  NEEDED. FOR ANXIETY ATTACK 60 tablet 1  . ibuprofen (ADVIL,MOTRIN) 200 MG tablet Take by mouth.    Marland Kitchen ketotifen (ZADITOR) 0.025 % ophthalmic solution 1 drop into affected eye    . Lisdexamfetamine Dimesylate (VYVANSE) 30 MG CHEW     . Naftifine HCl 2 % GEL Apply 1 application topically 2 (two) times daily. 60 g 2  . promethazine (PHENERGAN) 25 MG tablet Take 25 mg by mouth every 6 (six) hours as needed.    . promethazine (PHENERGAN) 25 MG tablet Take by mouth.    . SUMAtriptan (IMITREX) 50 MG tablet Take by mouth.    . triamcinolone (NASACORT) 55 MCG/ACT AERO nasal inhaler Nasal Allergy 55 mcg spray aerosol  2  SPRAYS ONCE A DAY NASALLY 30 DAYS    . VITAMIN D PO Take by mouth.    Marland Kitchen VYVANSE 20 MG CHEW Chew 1 tablet by mouth daily.    Marland Kitchen VYVANSE 30 MG CHEW Chew 1 tablet by mouth every morning.    . zolpidem (AMBIEN) 5 MG tablet Take 1-1.5 tablets (5-7.5 mg total) by mouth at bedtime as needed for sleep. 45 tablet 1   No current facility-administered medications for this visit.     Musculoskeletal: Strength & Muscle Tone: UTA Gait & Station: normal Patient leans: N/A  Psychiatric Specialty Exam: Review of Systems  Psychiatric/Behavioral: Negative for agitation, behavioral problems, confusion, decreased concentration, dysphoric mood, hallucinations, self-injury, sleep disturbance and suicidal ideas. The patient is not nervous/anxious and is not hyperactive.   All other systems reviewed and are negative.   There were no vitals taken for this visit.There is no height or weight on file to calculate BMI.  General Appearance: Casual  Eye Contact:  Fair  Speech:  Clear and Coherent  Volume:  Normal  Mood:  Euthymic  Affect:  Congruent  Thought Process:  Goal Directed and Descriptions of Associations: Intact  Orientation:  Full (Time, Place, and Person)  Thought Content: Logical   Suicidal Thoughts:  No  Homicidal Thoughts:  No  Memory:  Immediate;   Fair Recent;   Fair Remote;   Fair   Judgement:  Fair  Insight:  Fair  Psychomotor Activity:  Normal  Concentration:  Concentration: Fair and Attention Span: Fair  Recall:  [employer] of Knowledge: Fair  Language: Fair  Akathisia:  No  Handed:  Right  AIMS (if indicated): UTA  Assets:  Communication Skills Desire for Improvement Housing Intimacy Leisure Time Physical Health Resilience Social Support Talents/Skills Transportation Vocational/Educational  ADL's:  Intact  Cognition: WNL  Sleep:  Fair   Screenings: PHQ2-9   Flowsheet Row Video Visit from 02/01/2021 in [facility]  PHQ-2 Total Score 0    Flowsheet Row Video Visit from 02/01/2021 in [place] No Risk       Assessment and Plan: Arlissa Monteverde is a 45 year old Caucasian female, separated, employed, lives in [place], has a history of GAD, insomnia was evaluated by telemedicine today.  Patient is biologically predisposed given her family history.  Patient with psychosocial stressors of relationship struggles, current pandemic, health issues.  Patient is currently stable on current medication regimen.  Plan as noted below.  Plan GAD-stable Cymbalta 80 mg p.o. daily Hydroxyzine 12.5-25 mg p.o. twice daily as needed Continue CBT with Ms. Sonya Carter  Insomnia-improving Ambien 7.5 mg p.o. nightly as needed Sleep study completed-she is awaiting her CPAP device.  ADHD-currently follows up with [facility] attention specialist.  She is on Vyvanse 30 mg p.o. daily for the same  Patient to continue to follow-up with primary care provider for abnormal thyroid hormone levels.  Follow-up in clinic in 2 months in person.  This note was generated in part or whole with voice recognition software. Voice recognition is usually quite accurate but there are transcription errors that can and very often do occur. I apologize for any typographical errors that were not detected and  corrected.        Ursula Alert, MD 02/02/2021, 8:14 AM

## 2021-02-09 ENCOUNTER — Other Ambulatory Visit: Payer: Self-pay

## 2021-02-09 ENCOUNTER — Ambulatory Visit: Payer: 59 | Admitting: Dermatology

## 2021-02-09 DIAGNOSIS — L301 Dyshidrosis [pompholyx]: Secondary | ICD-10-CM | POA: Diagnosis not present

## 2021-02-09 MED ORDER — EUCRISA 2 % EX OINT: TOPICAL_OINTMENT | CUTANEOUS | 2 refills | Status: DC

## 2021-02-09 MED ORDER — CLOBETASOL PROPIONATE 0.05 % EX CREA: TOPICAL_CREAM | CUTANEOUS | 1 refills | Status: DC

## 2021-02-09 NOTE — Progress Notes (Signed)
   Follow-Up Visit   Subjective  Sara Suarez is a 45 y.o. female who presents for the following: Skin Problem (Patient has a history of itchy bumps/blisters of the palms x years. She recently started having blisters on the bottom of her right foot x 2 months. These are painful to walk on. ). Patient has multiple allergies and has gotten allergy shots for many years. No history of eczema.   The following portions of the chart were reviewed this encounter and updated as appropriate:       Review of Systems:  No other skin or systemic complaints except as noted in HPI or Assessment and Plan.  Objective  Well appearing patient in no apparent distress; mood and affect are within normal limits.  A focused examination was performed including face, hands, foot. Relevant physical exam findings are noted in the Assessment and Plan.  Objective  bil palms, R plantar foot: Pink scaly patch with focal superficial ulcerations, dried microvesicles and hyperkeratosis right plantar foot at ball; dried microvesicles of the left > right palm.   Assessment & Plan  Dyshidrotic eczema bil palms, R plantar foot  Chronic condition- with flare  Start clobetasol cream Apply to AA rash BID prn flares dsp 60g 1Rf. Avoid face, groin, axilla.  Start Eucrisa Ointment Apply to AAs rash qd/bid for maintenance dsp 60g 2Rf.  Dyshidrotic dermatitis is a chronic type of eczema that can come and go on the hands and fingers and feet.  While there is no cure, the rash and symptoms can be managed with topical prescription medications, and for more severe cases, with systemic medications.  Recommend mild soap and routine use of moisturizing cream after handwashing.  Minimize soap/water exposure when possible.     Topical steroids (such as triamcinolone, fluocinolone, fluocinonide, mometasone, clobetasol, halobetasol, betamethasone, hydrocortisone) can cause thinning and lightening of the skin if they are used for  too long in the same area. Your physician has selected the right strength medicine for your problem and area affected on the body. Please use your medication only as directed by your physician to prevent side effects.    clobetasol cream (TEMOVATE) 0.05 % - bil palms, R plantar foot  Crisaborole (EUCRISA) 2 % OINT - bil palms, R plantar foot  Return in about 6 weeks (around 03/23/2021) for f/u rash and TBSE.  IJamesetta Orleans, CMA, am acting as scribe for Brendolyn Patty, MD .  Documentation: I have reviewed the above documentation for accuracy and completeness, and I agree with the above.  Brendolyn Patty MD

## 2021-02-09 NOTE — Patient Instructions (Addendum)
Topical steroids (such as triamcinolone, fluocinolone, fluocinonide, mometasone, clobetasol, halobetasol, betamethasone, hydrocortisone) can cause thinning and lightening of the skin if they are used for too long in the same area. Your physician has selected the right strength medicine for your problem and area affected on the body. Please use your medication only as directed by your physician to prevent side effects.   Recommend mild soap and routine use of moisturizing cream after handwashing.  Minimize soap/water exposure when possible.    If you have any questions or concerns for your doctor, please call our main line at [phone] and press option 4 to reach your doctor's medical assistant. If no one answers, please leave a voicemail as directed and we will return your call as soon as possible. Messages left after 4 pm will be answered the following business day.   You may also send [place] a message via [place]. We typically respond to MyChart messages within 1-2 business days.  For prescription refills, please ask your pharmacy to contact our office. Our fax number is [phone].  If you have an urgent issue when the clinic is closed that cannot wait until the next business day, you can page your doctor at the number below.    Please note that while we do our best to be available for urgent issues outside of office hours, we are not available 24/7.   If you have an urgent issue and are unable to reach [place], you may choose to seek medical care at your doctor's office, retail clinic, urgent care center, or emergency room.  If you have a medical emergency, please immediately call 911 or go to the emergency department.  Pager Numbers  - Dr. Nehemiah Massed: [phone]  - Dr. Laurence Ferrari: [phone]  - Dr. Nicole Kindred: [phone]  In the event of inclement weather, please call our main line at [phone] for an update on the status of any delays or closures.  Dermatology Medication Tips: Please  keep the boxes that topical medications come in in order to help keep track of the instructions about where and how to use these. Pharmacies typically print the medication instructions only on the boxes and not directly on the medication tubes.   If your medication is too expensive, please contact our office at [phone] option 4 or send [place] a message through [place].   We are unable to tell what your co-pay for medications will be in advance as this is different depending on your insurance coverage. However, we may be able to find a substitute medication at lower cost or fill out paperwork to get insurance to cover a needed medication.   If a prior authorization is required to get your medication covered by your insurance company, please allow [place] 1-2 business days to complete this process.  Drug prices often vary depending on where the prescription is filled and some pharmacies may offer cheaper prices.  The website www.goodrx.com contains coupons for medications through different pharmacies. The prices here do not account for what the cost may be with help from insurance (it may be cheaper with your insurance), but the website can give you the price if you did not use any insurance.  - You can print the associated coupon and take it with your prescription to the pharmacy.  - You may also stop by our office during regular business hours and pick up a GoodRx coupon card.  - If you need your prescription sent electronically to a different pharmacy, notify our office through [facility]  MyChart or by phone at [phone] option 4.

## 2021-03-02 DIAGNOSIS — S63501A Unspecified sprain of right wrist, initial encounter: Secondary | ICD-10-CM | POA: Insufficient documentation

## 2021-03-22 ENCOUNTER — Ambulatory Visit: Payer: 59 | Admitting: Dermatology

## 2021-03-22 ENCOUNTER — Other Ambulatory Visit: Payer: Self-pay

## 2021-03-22 VITALS — BP 128/83

## 2021-03-22 DIAGNOSIS — L814 Other melanin hyperpigmentation: Secondary | ICD-10-CM

## 2021-03-22 DIAGNOSIS — Z1283 Encounter for screening for malignant neoplasm of skin: Secondary | ICD-10-CM

## 2021-03-22 DIAGNOSIS — L578 Other skin changes due to chronic exposure to nonionizing radiation: Secondary | ICD-10-CM

## 2021-03-22 DIAGNOSIS — L700 Acne vulgaris: Secondary | ICD-10-CM

## 2021-03-22 DIAGNOSIS — L301 Dyshidrosis [pompholyx]: Secondary | ICD-10-CM

## 2021-03-22 DIAGNOSIS — D229 Melanocytic nevi, unspecified: Secondary | ICD-10-CM

## 2021-03-22 MED ORDER — SPIRONOLACTONE 50 MG PO TABS: ORAL_TABLET | ORAL | 2 refills | Status: DC

## 2021-03-22 MED ORDER — CICLOPIROX OLAMINE 0.77 % EX CREA: TOPICAL_CREAM | CUTANEOUS | 1 refills | Status: DC

## 2021-03-22 NOTE — Progress Notes (Signed)
Follow-Up Visit   Subjective  Sara Suarez is a 45 y.o. 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 She is using clobetasol cream and Eucrisa ointment. Topicals improve when she uses them, but has flares off and on.). Patient also has acne breakouts on face, worse with period.  The patient presents for Total-Body Skin Exam (TBSE) for skin cancer screening and mole check.   The following portions of the chart were reviewed this encounter and updated as appropriate:       Review of Systems:  No other skin or systemic complaints except as noted in HPI or Assessment and Plan.  Objective  Well appearing patient in no apparent distress; mood and affect are within normal limits.  A full examination was performed including scalp, head, eyes, ears, nose, lips, neck, chest, axillae, abdomen, back, buttocks, bilateral upper extremities, bilateral lower extremities, hands, feet, fingers, toes, fingernails, and toenails. All findings within normal limits unless otherwise noted below.  Objective  bilateral palms, R plantar foot, fingers: Mild hyperkeratotic papules of the lateral fingers; dried microvesicles on left palm.  Scaling of plantar feet at ball  Objective  face: Resolving inflammatory papule on left jaw   Assessment & Plan   Skin cancer screening performed today.  Actinic Damage - chronic, secondary to cumulative UV radiation exposure/sun exposure over time - diffuse scaly erythematous macules with underlying dyspigmentation - Recommend daily broad spectrum sunscreen SPF 30+ to sun-exposed areas, reapply every 2 hours as needed.  - Recommend staying in the shade or wearing long sleeves, sun glasses (UVA+UVB protection) and wide brim hats (4-inch brim around the entire circumference of the hat). - Call for new or changing lesions.  Melanocytic Nevi - Tan-brown and/or  pink-flesh-colored symmetric macules and papules - Benign appearing on exam today - Observation - Call clinic for new or changing moles - Recommend daily use of broad spectrum spf 30+ sunscreen to sun-exposed areas.   Lentigines - Scattered tan macules - Due to sun exposure - Benign-appering, observe - Recommend daily broad spectrum sunscreen SPF 30+ to sun-exposed areas, reapply every 2 hours as needed. - Call for any changes  Dyshidrotic eczema bilateral palms, R plantar foot, fingers  Improving but not at goal Hand Dermatitis is a chronic type of eczema that can come and go on the hands and fingers.  While there is no cure, the rash and symptoms can be managed with topical prescription medications, and for more severe cases, with systemic medications.  Recommend mild soap and routine use of moisturizing cream after handwashing.  Minimize soap/water exposure when possible.     Continue clobetasol cream Apply to AA rash hands and foot qd/bid for flares   Continue Eucrisa ointment Apply to AA hands and foot qd/bid for maintenance  Start Ciclopirox cream BID to feet and between toes x 2 wks and prn recurrence.  Pt at higher risk of tinea with regular use of topical steroid.  ciclopirox (LOPROX) 0.77 % cream - bilateral palms, R plantar foot, fingers  Other Related Medications clobetasol cream (TEMOVATE) 0.05 % Crisaborole (EUCRISA) 2 % OINT  Acne vulgaris face   Start Spironolactone 50mg  take 1-2 po QD dsp #60 2Rf.  BP 128/83  Spironolactone can cause increased urination and cause blood pressure to decrease. Please watch for signs of lightheadedness and be cautious when changing position. It can sometimes cause breast tenderness or an irregular period  in premenopausal women. It can also increase potassium. The increase in potassium usually is not a concern unless you are taking other medicines that also increase potassium, so please be sure your doctor knows all of the other  medications you are taking. This medication should not be taken by pregnant women.  This medicine should also not be taken together with sulfa drugs like Bactrim (trimethoprim/sulfamethexazole).    spironolactone (ALDACTONE) 50 MG tablet - face  Return in about 3 months (around 06/22/2021) for acne, also TBSE 1 year.   IJamesetta Orleans, CMA, am acting as scribe for Brendolyn Patty, MD .  Documentation: I have reviewed the above documentation for accuracy and completeness, and I agree with the above.  Brendolyn Patty MD

## 2021-03-22 NOTE — Patient Instructions (Addendum)
Spironolactone can cause increased urination and cause blood pressure to decrease. Please watch for signs of lightheadedness and be cautious when changing position. It can sometimes cause breast tenderness or an irregular period in premenopausal women. It can also increase potassium. The increase in potassium usually is not a concern unless you are taking other medicines that also increase potassium, so please be sure your doctor knows all of the other medications you are taking. This medication should not be taken by pregnant women.  This medicine should also not be taken together with sulfa drugs like Bactrim (trimethoprim/sulfamethexazole).   Continue clobetasol cream - Apply 1-2 times a day to affected areas hands and feet for flares. Avoid face, groin, underarms. Continue Eucrisa Ointment - Apply 1-2 times a day to affected areas hands and feet for maintenance. Start Ciclopirox cream - Apply to feet and between toes twice a day x 2 weeks.  If you have any questions or concerns for your doctor, please call our main line at [phone] and press option 4 to reach your doctor's medical assistant. If no one answers, please leave a voicemail as directed and we will return your call as soon as possible. Messages left after 4 pm will be answered the following business day.   You may also send [place] a message via [place]. We typically respond to MyChart messages within 1-2 business days.  For prescription refills, please ask your pharmacy to contact our office. Our fax number is [phone].  If you have an urgent issue when the clinic is closed that cannot wait until the next business day, you can page your doctor at the number below.    Please note that while we do our best to be available for urgent issues outside of office hours, we are not available 24/7.   If you have an urgent issue and are unable to reach [place], you may choose to seek medical care at your doctor's office, retail clinic, urgent care  center, or emergency room.  If you have a medical emergency, please immediately call 911 or go to the emergency department.  Pager Numbers  - Dr. Nehemiah Massed: [phone]  - Dr. Laurence Ferrari: [phone]  - Dr. Nicole Kindred: [phone]  In the event of inclement weather, please call our main line at [phone] for an update on the status of any delays or closures.  Dermatology Medication Tips: Please keep the boxes that topical medications come in in order to help keep track of the instructions about where and how to use these. Pharmacies typically print the medication instructions only on the boxes and not directly on the medication tubes.   If your medication is too expensive, please contact our office at [phone] option 4 or send [place] a message through [place].   We are unable to tell what your co-pay for medications will be in advance as this is different depending on your insurance coverage. However, we may be able to find a substitute medication at lower cost or fill out paperwork to get insurance to cover a needed medication.   If a prior authorization is required to get your medication covered by your insurance company, please allow [place] 1-2 business days to complete this process.  Drug prices often vary depending on where the prescription is filled and some pharmacies may offer cheaper prices.  The website www.goodrx.com contains coupons for medications through different pharmacies. The prices here do not account for what the cost may be with help from insurance (it may be cheaper with your  insurance), but the website can give you the price if you did not use any insurance.  - You can print the associated coupon and take it with your prescription to the pharmacy.  - You may also stop by our office during regular business hours and pick up a GoodRx coupon card.  - If you need your prescription sent electronically to a different pharmacy, notify our office through [facility] or by  phone at [phone] option 4.

## 2021-03-29 ENCOUNTER — Encounter: Payer: Self-pay | Admitting: Psychiatry

## 2021-03-29 ENCOUNTER — Telehealth (INDEPENDENT_AMBULATORY_CARE_PROVIDER_SITE_OTHER): Payer: 59 | Admitting: Psychiatry

## 2021-03-29 ENCOUNTER — Other Ambulatory Visit: Payer: Self-pay

## 2021-03-29 DIAGNOSIS — Z634 Disappearance and death of family member: Secondary | ICD-10-CM | POA: Diagnosis not present

## 2021-03-29 DIAGNOSIS — F411 Generalized anxiety disorder: Secondary | ICD-10-CM | POA: Diagnosis not present

## 2021-03-29 DIAGNOSIS — F908 Attention-deficit hyperactivity disorder, other type: Secondary | ICD-10-CM | POA: Diagnosis not present

## 2021-03-29 DIAGNOSIS — F5105 Insomnia due to other mental disorder: Secondary | ICD-10-CM | POA: Diagnosis not present

## 2021-03-29 MED ORDER — DULOXETINE HCL 20 MG PO CPEP: ORAL_CAPSULE | ORAL | 0 refills | Status: DC

## 2021-03-29 NOTE — Progress Notes (Addendum)
Virtual Visit via Video Note  I connected with Sara Suarez on 03/29/21 at  9:30 AM EDT by a video enabled telemedicine application and verified that I am speaking with the correct person using two identifiers.  Location Provider Location : Office Patient Location : Home  Participants: Patient , Provider   I discussed the limitations of evaluation and management by telemedicine and the availability of in person appointments. The patient expressed understanding and agreed to proceed.    I discussed the assessment and treatment plan with the patient. The patient was provided an opportunity to ask questions and all were answered. The patient agreed with the plan and demonstrated an understanding of the instructions.   The patient was advised to call back or seek an in-person evaluation if the symptoms worsen or if the condition fails to improve as anticipated.  Video connection was lost at less than 50% of the duration of the visit, at which time the remainder of the visit was completed through audio only      [facility] MD OP Progress Note  03/29/2021 2:42 PM Sara Suarez  MRN:  [id number]  Chief Complaint:  Chief Complaint    Follow-up; Anxiety; Depression     HPI: Sara Suarez is a 45 year old Caucasian female, separated, lives in [place], has a history of GAD, insomnia, ADHD was evaluated by telemedicine today.  Patient today reports she is currently recovering from right-sided injury of her wrist.  She is undergoing physical therapy as well as takes medications like meloxicam as needed.  She wonders if there is an interaction with her Cymbalta and these medications.  She reports her wrist sprain is getting better and she currently denies any pain.  She however does report any kind of activity may trigger her pain and make it worse.  Patient also reports that one of her teammates recently passed away due to cancer.  She reports she is currently struggling with grief  and that does have an impact on her mood.  Patient reports she is willing to get in touch with her therapist for a sooner appointment for grief counseling.  Patient is compliant on medications.  Denies side effects.  Patient denies any suicidality, homicidality or perceptual disturbances.  Patient denies any other concerns today.  Visit Diagnosis:    ICD-10-CM   1. 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 Bereavement  Z63.4 DULoxetine (CYMBALTA) 20 MG capsule  4. Attention deficit hyperactivity disorder (ADHD), other type  F90.8     Past Psychiatric History: I have reviewed past psychiatric history from progress note on 07/09/2019.  Past trials of Cymbalta, Klonopin, and Ambien  Past Medical History:  Past Medical History:  Diagnosis Date  . ADHD (attention deficit hyperactivity disorder)   . Allergy   . Anxiety   . Depression     Past Surgical History:  Procedure Laterality Date  . BREAST CYST ASPIRATION Right    neg  . ENDOSCOPIC PLANTAR FASCIOTOMY    . FOOT SURGERY    . MOUTH SURGERY    . TUBAL LIGATION      Family Psychiatric History: I have reviewed family psychiatric history from progress note on 07/09/2019  Family History:  Family History  Problem Relation Age of Onset  . Breast cancer Mother 80  . Anxiety disorder Mother   . Breast cancer Maternal Aunt 65  . Anxiety disorder  Sister   . Panic disorder Sister   . Dementia Maternal Grandmother   . Dementia Paternal Grandmother     Social History: Reviewed social history from progress note on 07/09/2019 Social History   Socioeconomic History  . Marital status: Married    Spouse name: jon  . Number of children: 0  . Years of education: Not on file  . Highest education level: Bachelor's degree (e.g., BA, AB, BS)  Occupational History  . Not on file  Tobacco Use  . Smoking status: Never Smoker  . Smokeless  tobacco: Never Used  Vaping Use  . Vaping Use: Never used  Substance and Sexual Activity  . Alcohol use: Never  . Drug use: Never  . Sexual activity: Not on file  Other Topics Concern  . Not on file  Social History Narrative  . Not on file   Social Determinants of Health   Financial Resource Strain: Not on file  Food Insecurity: Not on file  Transportation Needs: Not on file  Physical Activity: Not on file  Stress: Not on file  Social Connections: Not on file    Allergies:  Allergies  Allergen Reactions  . Atomoxetine Other (See Comments)    Heartburn  . Guanfacine Other (See Comments)    drowsiness  . Chlorpheniramine-Pseudoeph     Hyperactive  . Pseudoephedrine Other (See Comments)    Hyperactive  . Shellfish Allergy Other (See Comments)  . Hydrocodone Itching  . Prednisone Anxiety    insomnia    Metabolic Disorder Labs: No results found for: HGBA1C, MPG No results found for: PROLACTIN No results found for: CHOL, TRIG, HDL, CHOLHDL, VLDL, LDLCALC No results found for: TSH  Therapeutic Level Labs: No results found for: LITHIUM No results found for: VALPROATE No components found for:  CBMZ  Current Medications: Current Outpatient Medications  Medication Sig Dispense Refill  . albuterol (VENTOLIN HFA) 108 (90 Base) MCG/ACT inhaler Inhale into the lungs.    Marland Kitchen azelastine (ASTELIN) 0.1 % nasal spray azelastine 137 mcg (0.1 %) nasal spray aerosol    . Cetirizine HCl 10 MG CAPS Take by mouth.    . ciclopirox (LOPROX) 0.77 % cream Apply to both feet and between toes twice a day x 2 weeks. 90 g 1  . clobetasol cream (TEMOVATE) 0.05 % Apply to affected areas rash on hands and foot twice daily for flares until improved. Avoid face, groin, underarms. 60 g 1  . Crisaborole (EUCRISA) 2 % OINT Apply to affected areas hands and feet 1-2 times a day for maintenance. 60 g 2  . cyclobenzaprine (FLEXERIL) 5 MG tablet Take by mouth.    . DULoxetine (CYMBALTA) 60 MG capsule  Take 1 capsule (60 mg total) by mouth daily. Combine daily with 20 mg daily 90 capsule 1  . EPINEPHrine 0.3 mg/0.3 mL IJ SOAJ injection See admin instructions.    . hydrOXYzine (ATARAX/VISTARIL) 25 MG tablet TAKE 0.5-1 TABLETS (12.5-25 MG TOTAL) BY MOUTH 2 (TWO) TIMES DAILY AS NEEDED. FOR ANXIETY ATTACK 60 tablet 1  . ibuprofen (ADVIL,MOTRIN) 200 MG tablet Take by mouth.    . Lisdexamfetamine Dimesylate (VYVANSE) 30 MG CHEW     . meloxicam (MOBIC) 15 MG tablet Take 1 tablet by mouth daily.    . promethazine (PHENERGAN) 25 MG tablet Take 25 mg by mouth every 6 (six) hours as needed.    Marland Kitchen spironolactone (ALDACTONE) 50 MG tablet Take 1-2 tablets by mouth every day for acne. 60 tablet 2  . SUMAtriptan (  IMITREX) 50 MG tablet Take by mouth.    . triamcinolone (NASACORT) 55 MCG/ACT AERO nasal inhaler Nasal Allergy 55 mcg spray aerosol  2 SPRAYS ONCE A DAY NASALLY 30 DAYS    . VITAMIN D PO Take by mouth.    Marland Kitchen VYVANSE 30 MG CHEW Chew 1 tablet by mouth every morning.    . zolpidem (AMBIEN) 5 MG tablet Take 1-1.5 tablets (5-7.5 mg total) by mouth at bedtime as needed for sleep. 45 tablet 1  . Diclofenac Sodium (PENNSAID) 2 % SOLN Pennsaid 20 mg/gram/actuation (2 %) topical soln in metered-dose pump  APPLY 2 PUMPS (40 MG) TO THE AFFECTED AREA BY TOPICAL ROUTE 2 TIMES PER DAY (Patient not taking: Reported on 03/29/2021)    . DULoxetine (CYMBALTA) 20 MG capsule TAKE 1 CAPSULE BY MOUTH EVERY DAY (TAKE WITH THE 60 MG) 90 capsule 0  . ketotifen (ZADITOR) 0.025 % ophthalmic solution 1 drop into affected eye     No current facility-administered medications for this visit.     Musculoskeletal: Strength & Muscle Tone: UTA Gait & Station: UTA Patient leans: N/A  Psychiatric Specialty Exam: Review of Systems  Musculoskeletal:       Rt.sided wrist sprain- hurts on and off  Psychiatric/Behavioral:       Grieving  All other systems reviewed and are negative.   There were no vitals taken for this visit.There  is no height or weight on file to calculate BMI.  General Appearance: UTA  Eye Contact:  UTA  Speech:  Clear and Coherent  Volume:  Normal  Mood:  Grieving  Affect:  UTA  Thought Process:  Goal Directed and Descriptions of Associations: Intact  Orientation:  Full (Time, Place, and Person)  Thought Content: Logical   Suicidal Thoughts:  No  Homicidal Thoughts:  No  Memory:  Immediate;   Fair Recent;   Fair Remote;   Good  Judgement:  Good  Insight:  Fair  Psychomotor Activity:  UTA  Concentration:  Concentration: Fair and Attention Span: Fair  Recall:  [employer] of Knowledge: Fair  Language: Fair  Akathisia:  No  Handed:  Right  AIMS (if indicated): UTA  Assets:  Communication Skills Desire for Improvement Housing Social Support  ADL's:  Intact  Cognition: WNL  Sleep:  Fair   Screenings: PHQ2-9   Flowsheet Row Video Visit from 02/01/2021 in [facility]  PHQ-2 Total Score 0    Flowsheet Row Video Visit from 02/01/2021 in [facility] No Risk       Assessment and Plan: Sara Suarez is a 45 year old Caucasian female, employed, lives in [place], has a history of GAD, insomnia was evaluated by telemedicine today.  Patient is biologically predisposed given family history.  Patient with psychosocial stressors of relationship struggles, current pandemic, recent loss of a colleague.  She will benefit from the following plan.  Plan GAD- stable Cymbalta 80 mg p.o. daily Hydroxyzine 12.5-25 mg p.o. twice daily as needed Continue CBT with Ms. Derrek Gu  Insomnia- stable Ambien 7.5 mg p.o. nightly as needed She had sleep study completed and was prescribed CPAP.  ADHD-continue to follow-up with [place] attention specialist.  Vyvanse 30 mg p.o. daily.  Bereavement-unstable Provided grief counseling.  Patient to continue to follow-up with her therapist.  Patient to continue to follow-up  with her provider for her wrist injury, continue physical therapy.  Patient provided education about drug to drug interaction with Cymbalta, meloxicam, Aldactone.  Follow-up in clinic in 5 to 6 weeks or sooner if needed.  This note was generated in part or whole with voice recognition software. Voice recognition is usually quite accurate but there are transcription errors that can and very often do occur. I apologize for any typographical errors that were not detected and corrected.  I have spent at least 24 minutes non face to face with patient today .      Ursula Alert, MD 03/29/2021, 2:42 PM

## 2021-05-05 ENCOUNTER — Telehealth (INDEPENDENT_AMBULATORY_CARE_PROVIDER_SITE_OTHER): Payer: 59 | Admitting: Psychiatry

## 2021-05-05 ENCOUNTER — Encounter: Payer: Self-pay | Admitting: Psychiatry

## 2021-05-05 ENCOUNTER — Other Ambulatory Visit: Payer: Self-pay

## 2021-05-05 DIAGNOSIS — Z634 Disappearance and death of family member: Secondary | ICD-10-CM | POA: Diagnosis not present

## 2021-05-05 DIAGNOSIS — F5105 Insomnia due to other mental disorder: Secondary | ICD-10-CM | POA: Diagnosis not present

## 2021-05-05 DIAGNOSIS — F908 Attention-deficit hyperactivity disorder, other type: Secondary | ICD-10-CM | POA: Diagnosis not present

## 2021-05-05 DIAGNOSIS — F411 Generalized anxiety disorder: Secondary | ICD-10-CM | POA: Diagnosis not present

## 2021-05-05 NOTE — Progress Notes (Signed)
Virtual Visit via Video Note  I connected with Sara Suarez on 05/05/21 at 11:00 AM EDT by a video enabled telemedicine application and verified that I am speaking with the correct person using two identifiers.  Location Provider Location : [facility] Patient Location : Home  Participants: Patient , Provider   I discussed the limitations of evaluation and management by telemedicine and the availability of in person appointments. The patient expressed understanding and agreed to proceed.    I discussed the assessment and treatment plan with the patient. The patient was provided an opportunity to ask questions and all were answered. The patient agreed with the plan and demonstrated an understanding of the instructions.   The patient was advised to call back or seek an in-person evaluation if the symptoms worsen or if the condition fails to improve as anticipated.                                                               [facility] MD OP Progress Note  05/05/2021 7:18 PM Sara Suarez  MRN:  [id number]  Chief Complaint:  Chief Complaint   Follow-up; Anxiety    HPI: Sara Suarez is a 45 year old Caucasian female, lives in [place], has a history of GAD, insomnia, ADHD was evaluated by telemedicine today.  Patient today reports she is currently coping with her grief better than before.  Patient reports work is going well.  Work continues to be busy.  She is working on her relationship with her husband with whom she is separated.  She reports her husband is planning to move back in in August.  She reports her attention and focus is good on the Vyvanse.  Patient reports anxiety symptoms as under control and she is doing well on the Cymbalta.  She denies side effects to medications.  She reports sleep as good.  Patient denies any other concerns today.  Visit Diagnosis:    ICD-10-CM   1. Generalized anxiety disorder  F41.1     2. Insomnia due to mental condition  F51.05     grief    3. Bereavement  Z63.4     4. Attention deficit hyperactivity disorder (ADHD), other type  F90.8       Past Psychiatric History: Reviewed past psychiatric history from progress note on 07/09/2019.  Past trials of Cymbalta, Klonopin, Ambien  Past Medical History:  Past Medical History:  Diagnosis Date   ADHD (attention deficit hyperactivity disorder)    Allergy    Anxiety    Depression     Past Surgical History:  Procedure Laterality Date   BREAST CYST ASPIRATION Right    neg   ENDOSCOPIC PLANTAR FASCIOTOMY     FOOT SURGERY     MOUTH SURGERY     TUBAL LIGATION      Family Psychiatric History: Reviewed family psychiatric history from progress note on 07/09/2019  Family History:  Family History  Problem Relation Age of Onset   Breast cancer Mother 7   Anxiety disorder Mother    Breast cancer Maternal Aunt 3   Anxiety disorder Sister    Panic disorder Sister    Dementia Maternal Grandmother    Dementia Paternal Grandmother     Social History: Reviewed social history from progress note on 07/09/2019  Social History   Socioeconomic History   Marital status: Married    Spouse name: jon   Number of children: 0   Years of education: Not on file   Highest education level: Bachelor's degree (e.g., BA, AB, BS)  Occupational History   Not on file  Tobacco Use   Smoking status: Never   Smokeless tobacco: Never  Vaping Use   Vaping Use: Never used  Substance and Sexual Activity   Alcohol use: Never   Drug use: Never   Sexual activity: Not on file  Other Topics Concern   Not on file  Social History Narrative   Not on file   Social Determinants of Health   Financial Resource Strain: Not on file  Food Insecurity: Not on file  Transportation Needs: Not on file  Physical Activity: Not on file  Stress: Not on file  Social Connections: Not on file    Allergies:  Allergies  Allergen Reactions   Atomoxetine Other (See Comments)    Heartburn    Guanfacine Other (See Comments)    drowsiness   Chlorpheniramine-Pseudoeph     Hyperactive   Pseudoephedrine Other (See Comments)    Hyperactive   Shellfish Allergy Other (See Comments)   Hydrocodone Itching   Prednisone Anxiety    insomnia    Metabolic Disorder Labs: No results found for: HGBA1C, MPG No results found for: PROLACTIN No results found for: CHOL, TRIG, HDL, CHOLHDL, VLDL, LDLCALC No results found for: TSH  Therapeutic Level Labs: No results found for: LITHIUM No results found for: VALPROATE No components found for:  CBMZ  Current Medications: Current Outpatient Medications  Medication Sig Dispense Refill   albuterol (VENTOLIN HFA) 108 (90 Base) MCG/ACT inhaler Inhale into the lungs.     azelastine (ASTELIN) 0.1 % nasal spray azelastine 137 mcg (0.1 %) nasal spray aerosol     Cetirizine HCl 10 MG CAPS Take by mouth.     ciclopirox (LOPROX) 0.77 % cream Apply to both feet and between toes twice a day x 2 weeks. 90 g 1   clobetasol cream (TEMOVATE) 0.05 % Apply to affected areas rash on hands and foot twice daily for flares until improved. Avoid face, groin, underarms. 60 g 1   Crisaborole (EUCRISA) 2 % OINT Apply to affected areas hands and feet 1-2 times a day for maintenance. 60 g 2   cyclobenzaprine (FLEXERIL) 5 MG tablet Take by mouth.     Diclofenac Sodium (PENNSAID) 2 % SOLN Pennsaid 20 mg/gram/actuation (2 %) topical soln in metered-dose pump  APPLY 2 PUMPS (40 MG) TO THE AFFECTED AREA BY TOPICAL ROUTE 2 TIMES PER DAY (Patient not taking: Reported on 03/29/2021)     DULoxetine (CYMBALTA) 20 MG capsule TAKE 1 CAPSULE BY MOUTH EVERY DAY (TAKE WITH THE 60 MG) 90 capsule 0   DULoxetine (CYMBALTA) 60 MG capsule Take 1 capsule (60 mg total) by mouth daily. Combine daily with 20 mg daily 90 capsule 1   EPINEPHrine 0.3 mg/0.3 mL IJ SOAJ injection See admin instructions.     hydrOXYzine (ATARAX/VISTARIL) 25 MG tablet TAKE 0.5-1 TABLETS (12.5-25 MG TOTAL) BY MOUTH 2  (TWO) TIMES DAILY AS NEEDED. FOR ANXIETY ATTACK 60 tablet 1   ibuprofen (ADVIL,MOTRIN) 200 MG tablet Take by mouth.     ketotifen (ZADITOR) 0.025 % ophthalmic solution 1 drop into affected eye     Lisdexamfetamine Dimesylate (VYVANSE) 30 MG CHEW      meloxicam (MOBIC) 15 MG tablet Take 1 tablet by mouth  daily. (Patient not taking: Reported on 05/05/2021)     promethazine (PHENERGAN) 25 MG tablet Take 25 mg by mouth every 6 (six) hours as needed.     spironolactone (ALDACTONE) 50 MG tablet Take 1-2 tablets by mouth every day for acne. 60 tablet 2   SUMAtriptan (IMITREX) 50 MG tablet Take by mouth.     triamcinolone (NASACORT) 55 MCG/ACT AERO nasal inhaler Nasal Allergy 55 mcg spray aerosol  2 SPRAYS ONCE A DAY NASALLY 30 DAYS     VITAMIN D PO Take by mouth.     VYVANSE 30 MG CHEW Chew 1 tablet by mouth every morning.     zolpidem (AMBIEN) 5 MG tablet Take 1-1.5 tablets (5-7.5 mg total) by mouth at bedtime as needed for sleep. 45 tablet 1   No current facility-administered medications for this visit.     Musculoskeletal: Strength & Muscle Tone:  UTA Gait & Station: normal Patient leans: N/A  Psychiatric Specialty Exam: Review of Systems  Psychiatric/Behavioral:         Grieving  All other systems reviewed and are negative.  There were no vitals taken for this visit.There is no height or weight on file to calculate BMI.  General Appearance: Casual  Eye Contact:  Good  Speech:  Clear and Coherent  Volume:  Normal  Mood:   Grieving - improving  Affect:  Appropriate  Thought Process:  Goal Directed and Descriptions of Associations: Intact  Orientation:  Full (Time, Place, and Person)  Thought Content: WDL   Suicidal Thoughts:  No  Homicidal Thoughts:  No  Memory:  Immediate;   Fair Recent;   Fair Remote;   Fair  Judgement:  Fair  Insight:  Good  Psychomotor Activity:  Normal  Concentration:  Concentration: Fair and Attention Span: Fair  Recall:  Good  Fund of Knowledge: Fair   Language: Fair  Akathisia:  No  Handed:  Right  AIMS (if indicated): not done  Assets:  Communication Skills Desire for Improvement Housing Social Support Talents/Skills Transportation Vocational/Educational  ADL's:  Intact  Cognition: WNL  Sleep:  Fair   Screenings: PHQ2-9    Flowsheet Row Video Visit from 02/01/2021 in [facility]  PHQ-2 Total Score 0      Flowsheet Row Video Visit from 02/01/2021 in [facility] No Risk        Assessment and Plan: Vetra Shinall is a 45 year old Caucasian female, employed, lives in [place], has a history of GAD, insomnia was evaluated by telemedicine today.  Patient is biologically predisposed given family history.  Patient is currently making progress.,coping with her grief better.  Plan as noted below.  Plan GAD-stable Cymbalta 80 mg p.o. daily Hydroxyzine 12.5-25 mg p.o. twice daily as needed Continue CBT with Ms. Derrek Gu.  Insomnia-stable Ambien 7.5 mg p.o. nightly as needed Continue CPAP  ADHD-follow-up with [facility] attention specialist.  Currently on Vyvanse.  Bereavement-improving We will monitor closely.  Follow-up in clinic in 3 months or sooner if needed.  This note was generated in part or whole with voice recognition software. Voice recognition is usually quite accurate but there are transcription errors that can and very often do occur. I apologize for any typographical errors that were not detected and corrected.       Ursula Alert, MD 05/06/2021, 8:27 AM

## 2021-06-16 ENCOUNTER — Other Ambulatory Visit: Payer: Self-pay | Admitting: Dermatology

## 2021-06-16 DIAGNOSIS — L700 Acne vulgaris: Secondary | ICD-10-CM

## 2021-06-27 ENCOUNTER — Ambulatory Visit: Payer: 59 | Admitting: Dermatology

## 2021-06-27 ENCOUNTER — Other Ambulatory Visit: Payer: Self-pay

## 2021-06-27 ENCOUNTER — Ambulatory Visit: Payer: Self-pay | Admitting: Dermatology

## 2021-06-27 VITALS — BP 133/94

## 2021-06-27 DIAGNOSIS — L700 Acne vulgaris: Secondary | ICD-10-CM | POA: Diagnosis not present

## 2021-06-27 DIAGNOSIS — L301 Dyshidrosis [pompholyx]: Secondary | ICD-10-CM | POA: Diagnosis not present

## 2021-06-27 MED ORDER — SPIRONOLACTONE 50 MG PO TABS: ORAL_TABLET | ORAL | 1 refills | Status: DC

## 2021-06-27 NOTE — Progress Notes (Signed)
   Follow-Up Visit   Subjective  Sara Suarez is a 45 y.o. female who presents for the following: Follow-up (Patient presents for 3 month follow-up acne and Dyshidrotic eczema. Acne is improved taking Spironolactone '50mg'$  alternating 1 po QD with 2 po QD. Eczema is constant on right plantar foot, off and on on palms and fingers. She is using Eucrisa ointment twice daily. She scratches blisters a lot. She is not using Clobetasol cream right now. ). She used ciclopirox cream twice a day after last visit for 2 weeks. She didn't notice any improvement. Never used the clobetasol cream on her foot.   The following portions of the chart were reviewed this encounter and updated as appropriate:       Review of Systems:  No other skin or systemic complaints except as noted in HPI or Assessment and Plan.  Objective  Well appearing patient in no apparent distress; mood and affect are within normal limits.  A focused examination was performed including hands, Right foot, face, back. Relevant physical exam findings are noted in the Assessment and Plan.  face Inflammatory papule on left mandible. Few inflamed comedones upper back  bialteral palms, R plantar foot, fingers R foot Webspace maceration; erythema with hyperkeratosis on right plantar foot at ball Hands, mild erythema/scale   Assessment & Plan  Acne vulgaris face  Improving  Decrease Spironolactone '50mg'$  take 1 po QD since some menstrual irregularities. May increase to 2 po QD with flares. Pt defers using topicals  BP 133/94  Spironolactone can cause increased urination and cause blood pressure to decrease. Please watch for signs of lightheadedness and be cautious when changing position. It can sometimes cause breast tenderness or an irregular period in premenopausal women. It can also increase potassium. The increase in potassium usually is not a concern unless you are taking other medicines that also increase potassium, so please  be sure your doctor knows all of the other medications you are taking. This medication should not be taken by pregnant women.  This medicine should also not be taken together with sulfa drugs like Bactrim (trimethoprim/sulfamethexazole).    Related Medications spironolactone (ALDACTONE) 50 MG tablet Take 1-2 tablets by mouth daily for acne.  Dyshidrotic eczema bialteral palms, R plantar foot, fingers  With flare on foot, hands improving  Hand Dermatitis is a chronic type of eczema that can come and go on the hands and fingers.  While there is no cure, the rash and symptoms can be managed with topical prescription medications, and for more severe cases, with systemic medications.  Recommend mild soap and routine use of moisturizing cream after handwashing.  Minimize soap/water exposure when possible.    Continue Eucrisa ointment qd/bid Aas for maintenance.   Restart ciclopirox cream to feet and between toes BID x 2 weeks.  Then may restart clobetasol cream qd/bid aas feet/hands prn flares    Related Medications clobetasol cream (TEMOVATE) 0.05 % Apply to affected areas rash on hands and foot twice daily for flares until improved. Avoid face, groin, underarms.  Crisaborole (EUCRISA) 2 % OINT Apply to affected areas hands and feet 1-2 times a day for maintenance.  ciclopirox (LOPROX) 0.77 % cream Apply to both feet and between toes twice a day x 2 weeks.  Return in about 6 months (around 12/28/2021) for acne, dyshidrotic eczema.  Documentation: I have reviewed the above documentation for accuracy and completeness, and I agree with the above.  Brendolyn Patty MD

## 2021-06-27 NOTE — Patient Instructions (Addendum)
Restart Ciclopirox cream - apply to feet and between toes twice a day for 2 weeks. Continue Eucrisa ointment to affected areas hands and feet 1-2 times a day for maintenance. After finished with ciclopirox cream, restart clobetasol cream (steroid) 1-2 times a day for flares.  Topical steroids (such as triamcinolone, fluocinolone, fluocinonide, mometasone, clobetasol, halobetasol, betamethasone, hydrocortisone) can cause thinning and lightening of the skin if they are used for too long in the same area. Your physician has selected the right strength medicine for your problem and area affected on the body. Please use your medication only as directed by your physician to prevent side effects.   If you have any questions or concerns for your doctor, please call our main line at [phone] and press option 4 to reach your doctor's medical assistant. If no one answers, please leave a voicemail as directed and we will return your call as soon as possible. Messages left after 4 pm will be answered the following business day.   You may also send [place] a message via [place]. We typically respond to MyChart messages within 1-2 business days.  For prescription refills, please ask your pharmacy to contact our office. Our fax number is [phone].  If you have an urgent issue when the clinic is closed that cannot wait until the next business day, you can page your doctor at the number below.    Please note that while we do our best to be available for urgent issues outside of office hours, we are not available 24/7.   If you have an urgent issue and are unable to reach [place], you may choose to seek medical care at your doctor's office, retail clinic, urgent care center, or emergency room.  If you have a medical emergency, please immediately call 911 or go to the emergency department.  Pager Numbers  - Dr. Nehemiah Massed: [phone]  - Dr. Laurence Ferrari: [phone]  - Dr. Nicole Kindred: [phone]  In the event of  inclement weather, please call our main line at [phone] for an update on the status of any delays or closures.  Dermatology Medication Tips: Please keep the boxes that topical medications come in in order to help keep track of the instructions about where and how to use these. Pharmacies typically print the medication instructions only on the boxes and not directly on the medication tubes.   If your medication is too expensive, please contact our office at [phone] option 4 or send [place] a message through [place].   We are unable to tell what your co-pay for medications will be in advance as this is different depending on your insurance coverage. However, we may be able to find a substitute medication at lower cost or fill out paperwork to get insurance to cover a needed medication.   If a prior authorization is required to get your medication covered by your insurance company, please allow [place] 1-2 business days to complete this process.  Drug prices often vary depending on where the prescription is filled and some pharmacies may offer cheaper prices.  The website www.goodrx.com contains coupons for medications through different pharmacies. The prices here do not account for what the cost may be with help from insurance (it may be cheaper with your insurance), but the website can give you the price if you did not use any insurance.  - You can print the associated coupon and take it with your prescription to the pharmacy.  - You may also stop by our office during regular business  hours and pick up a GoodRx coupon card.  - If you need your prescription sent electronically to a different pharmacy, notify our office through [facility] or by phone at [phone] option 4.

## 2021-06-29 ENCOUNTER — Other Ambulatory Visit: Payer: Self-pay | Admitting: Psychiatry

## 2021-06-29 DIAGNOSIS — Z634 Disappearance and death of family member: Secondary | ICD-10-CM

## 2021-06-29 DIAGNOSIS — F411 Generalized anxiety disorder: Secondary | ICD-10-CM

## 2021-07-18 ENCOUNTER — Other Ambulatory Visit: Payer: Self-pay | Admitting: Dermatology

## 2021-07-18 DIAGNOSIS — L700 Acne vulgaris: Secondary | ICD-10-CM

## 2021-08-10 ENCOUNTER — Other Ambulatory Visit: Payer: Self-pay

## 2021-08-10 ENCOUNTER — Telehealth (INDEPENDENT_AMBULATORY_CARE_PROVIDER_SITE_OTHER): Payer: 59 | Admitting: Psychiatry

## 2021-08-10 ENCOUNTER — Telehealth: Payer: Self-pay

## 2021-08-10 ENCOUNTER — Encounter: Payer: Self-pay | Admitting: Psychiatry

## 2021-08-10 DIAGNOSIS — Z634 Disappearance and death of family member: Secondary | ICD-10-CM | POA: Diagnosis not present

## 2021-08-10 DIAGNOSIS — F411 Generalized anxiety disorder: Secondary | ICD-10-CM

## 2021-08-10 DIAGNOSIS — F4322 Adjustment disorder with anxiety: Secondary | ICD-10-CM

## 2021-08-10 DIAGNOSIS — F5105 Insomnia due to other mental disorder: Secondary | ICD-10-CM

## 2021-08-10 DIAGNOSIS — F908 Attention-deficit hyperactivity disorder, other type: Secondary | ICD-10-CM

## 2021-08-10 MED ORDER — ZOLPIDEM TARTRATE 5 MG PO TABS: 5.0000 mg | ORAL_TABLET | Freq: Every evening | ORAL | 3 refills | Status: DC | PRN

## 2021-08-10 MED ORDER — HYDROXYZINE PAMOATE 50 MG PO CAPS: 50.0000 mg | ORAL_CAPSULE | Freq: Two times a day (BID) | ORAL | 1 refills | Status: DC | PRN
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
## 2021-08-10 NOTE — Telephone Encounter (Signed)
done

## 2021-08-10 NOTE — Telephone Encounter (Signed)
pt left message that she needed a refill on the zolpidem

## 2021-08-29 DIAGNOSIS — M7662 Achilles tendinitis, left leg: Secondary | ICD-10-CM | POA: Insufficient documentation

## 2021-09-02 ENCOUNTER — Other Ambulatory Visit: Payer: Self-pay | Admitting: Psychiatry

## 2021-09-02 DIAGNOSIS — F411 Generalized anxiety disorder: Secondary | ICD-10-CM

## 2021-09-28 ENCOUNTER — Telehealth (INDEPENDENT_AMBULATORY_CARE_PROVIDER_SITE_OTHER): Payer: 59 | Admitting: Psychiatry

## 2021-09-28 ENCOUNTER — Other Ambulatory Visit: Payer: Self-pay

## 2021-09-28 ENCOUNTER — Encounter: Payer: Self-pay | Admitting: Psychiatry

## 2021-09-28 DIAGNOSIS — F4322 Adjustment disorder with anxiety: Secondary | ICD-10-CM

## 2021-09-28 DIAGNOSIS — F5105 Insomnia due to other mental disorder: Secondary | ICD-10-CM

## 2021-09-28 DIAGNOSIS — F411 Generalized anxiety disorder: Secondary | ICD-10-CM

## 2021-09-28 DIAGNOSIS — F908 Attention-deficit hyperactivity disorder, other type: Secondary | ICD-10-CM | POA: Diagnosis not present

## 2021-09-28 MED ORDER — DULOXETINE HCL 60 MG PO CPEP: 60.0000 mg | ORAL_CAPSULE | Freq: Every day | ORAL | 1 refills | Status: DC

## 2021-09-28 MED ORDER — DULOXETINE HCL 20 MG PO CPEP: ORAL_CAPSULE | ORAL | 1 refills | Status: DC

## 2021-09-28 NOTE — Progress Notes (Signed)
Virtual Visit via Video Note  I connected with Sara Suarez on 09/28/21 at 10:00 AM EST by a video enabled telemedicine application and verified that I am speaking with the correct person using two identifiers.  Location Provider Location : [facility] Patient Location : Home  Participants: Patient , Provider   I discussed the limitations of evaluation and management by telemedicine and the availability of in person appointments. The patient expressed understanding and agreed to proceed.   I discussed the assessment and treatment plan with the patient. The patient was provided an opportunity to ask questions and all were answered. The patient agreed with the plan and demonstrated an understanding of the instructions.   The patient was advised to call back or seek an in-person evaluation if the symptoms worsen or if the condition fails to improve as anticipated.  Video connection was lost at less than 50% of the duration of the visit, at which time the remainder of the visit was completed through audio only     [facility] MD OP Progress Note  09/28/2021 12:38 PM Sara Suarez  MRN:  [id number]  Chief Complaint:  Chief Complaint   Follow-up; Depression; Anxiety    HPI: Sara Suarez is a 45 year old Caucasian female, lives in [place], has a history of GAD, insomnia, adjustment disorder, ADHD was evaluated by telemedicine today.  Patient today reports that she is currently overall doing well.  She is settling in with her husband, their relationship is going in the right direction.  She reports she is coping with her grief better than before.  She is able to function okay, reports work is going well.  She reports her attention and focus as well on the current dosage of Vyvanse.  She is compliant on the Cymbalta, hydroxyzine, reports she currently does not have any side effects.  Sleep is overall okay as long as she takes the zolpidem as needed.  She does have nasal  stuffiness recently since she has a heater on.  This does have an impact on her sleep on and off.  She does use CPAP and agrees to follow-up with her CPAP provider for recommendations.  Patient denies any suicidality, homicidality or perceptual disturbances.  Patient continues to follow-up with her therapist.    Visit Diagnosis:    ICD-10-CM   1. Generalized anxiety disorder  F41.1 DULoxetine (CYMBALTA) 60 MG capsule    DULoxetine (CYMBALTA) 20 MG capsule    2. Insomnia due to mental condition  F51.05    mood    3. Adjustment disorder with anxious mood  F43.22     4. Attention deficit hyperactivity disorder (ADHD), other type  F90.8       Past Psychiatric History: Reviewed past psychiatric history from progress note on 07/09/2019.  Past trials of Cymbalta, Klonopin, Ambien  Past Medical History:  Past Medical History:  Diagnosis Date   ADHD (attention deficit hyperactivity disorder)    Allergy    Anxiety    Depression     Past Surgical History:  Procedure Laterality Date   BREAST CYST ASPIRATION Right    neg   ENDOSCOPIC PLANTAR FASCIOTOMY     FOOT SURGERY     MOUTH SURGERY     TUBAL LIGATION      Family Psychiatric History: Reviewed family psychiatric history from progress note on 07/09/2019  Family History:  Family History  Problem Relation Age of Onset   Breast cancer Mother 26   Anxiety disorder Mother    Breast  cancer Maternal Aunt 65   Anxiety disorder Sister    Panic disorder Sister    Dementia Maternal Grandmother    Dementia Paternal Grandmother     Social History: Reviewed social history from progress note on 07/09/2019 Social History   Socioeconomic History   Marital status: Married    Spouse name: jon   Number of children: 0   Years of education: Not on file   Highest education level: Bachelor's degree (e.g., BA, AB, BS)  Occupational History   Not on file  Tobacco Use   Smoking status: Never   Smokeless tobacco: Never  Vaping Use    Vaping Use: Never used  Substance and Sexual Activity   Alcohol use: Never   Drug use: Never   Sexual activity: Not on file  Other Topics Concern   Not on file  Social History Narrative   Not on file   Social Determinants of Health   Financial Resource Strain: Not on file  Food Insecurity: Not on file  Transportation Needs: Not on file  Physical Activity: Not on file  Stress: Not on file  Social Connections: Not on file    Allergies:  Allergies  Allergen Reactions   Atomoxetine Other (See Comments)    Heartburn   Guanfacine Other (See Comments)    drowsiness   Chlorpheniramine-Pseudoeph     Hyperactive   Pseudoephedrine Other (See Comments)    Hyperactive   Shellfish Allergy Other (See Comments)   Hydrocodone Itching   Prednisone Anxiety    insomnia    Metabolic Disorder Labs: No results found for: HGBA1C, MPG No results found for: PROLACTIN No results found for: CHOL, TRIG, HDL, CHOLHDL, VLDL, LDLCALC No results found for: TSH  Therapeutic Level Labs: No results found for: LITHIUM No results found for: VALPROATE No components found for:  CBMZ  Current Medications: Current Outpatient Medications  Medication Sig Dispense Refill   albuterol (VENTOLIN HFA) 108 (90 Base) MCG/ACT inhaler Inhale into the lungs.     azelastine (ASTELIN) 0.1 % nasal spray azelastine 137 mcg (0.1 %) nasal spray aerosol     Cetirizine HCl 10 MG CAPS Take by mouth.     ciclopirox (LOPROX) 0.77 % cream Apply to both feet and between toes twice a day x 2 weeks. 90 g 1   clobetasol cream (TEMOVATE) 0.05 % Apply to affected areas rash on hands and foot twice daily for flares until improved. Avoid face, groin, underarms. 60 g 1   Crisaborole (EUCRISA) 2 % OINT Apply to affected areas hands and feet 1-2 times a day for maintenance. 60 g 2   cyclobenzaprine (FLEXERIL) 5 MG tablet Take by mouth.     Diclofenac Sodium (PENNSAID) 2 % SOLN Pennsaid 20 mg/gram/actuation (2 %) topical soln in  metered-dose pump  APPLY 2 PUMPS (40 MG) TO THE AFFECTED AREA BY TOPICAL ROUTE 2 TIMES PER DAY (Patient not taking: Reported on 03/29/2021)     DULoxetine (CYMBALTA) 20 MG capsule Take along with 60 mg daily 90 capsule 1   DULoxetine (CYMBALTA) 60 MG capsule Take 1 capsule (60 mg total) by mouth daily. Combine daily with 20 mg daily 90 capsule 1   EPINEPHrine 0.3 mg/0.3 mL IJ SOAJ injection See admin instructions.     hydrOXYzine (VISTARIL) 50 MG capsule TAKE 1 CAPSULE (50 MG TOTAL) BY MOUTH 2 (TWO) TIMES DAILY AS NEEDED. 180 capsule 1   ibuprofen (ADVIL) 800 MG tablet Take 800 mg by mouth 3 (three) times daily.  ibuprofen (ADVIL,MOTRIN) 200 MG tablet Take by mouth.     ketotifen (ZADITOR) 0.025 % ophthalmic solution 1 drop into affected eye     Lisdexamfetamine Dimesylate (VYVANSE) 30 MG CHEW      meloxicam (MOBIC) 15 MG tablet Take 1 tablet by mouth daily. (Patient not taking: Reported on 05/05/2021)     promethazine (PHENERGAN) 25 MG tablet Take 25 mg by mouth every 6 (six) hours as needed.     spironolactone (ALDACTONE) 50 MG tablet Take 1-2 tablets by mouth daily for acne. 60 tablet 1   SUMAtriptan (IMITREX) 50 MG tablet Take by mouth.     triamcinolone (NASACORT) 55 MCG/ACT AERO nasal inhaler Nasal Allergy 55 mcg spray aerosol  2 SPRAYS ONCE A DAY NASALLY 30 DAYS     VITAMIN D PO Take by mouth.     VYVANSE 30 MG CHEW Chew 1 tablet by mouth every morning.     zolpidem (AMBIEN) 5 MG tablet Take 1-1.5 tablets (5-7.5 mg total) by mouth at bedtime as needed for sleep. 45 tablet 3   No current facility-administered medications for this visit.     Musculoskeletal: Strength & Muscle Tone: UTA Gait & Station:  UTA Patient leans: N/A  Psychiatric Specialty Exam: Review of Systems  Psychiatric/Behavioral:  Negative for agitation, behavioral problems, confusion, decreased concentration, dysphoric mood, hallucinations, self-injury, sleep disturbance and suicidal ideas. The patient is not  nervous/anxious and is not hyperactive.   All other systems reviewed and are negative.  There were no vitals taken for this visit.There is no height or weight on file to calculate BMI.  General Appearance:  UTA  Eye Contact:   UTA  Speech:  Clear and Coherent  Volume:  Normal  Mood:  Euthymic  Affect:   UTA  Thought Process:  Goal Directed and Descriptions of Associations: Intact  Orientation:  Full (Time, Place, and Person)  Thought Content: Logical   Suicidal Thoughts:  No  Homicidal Thoughts:  No  Memory:  Immediate;   Fair Recent;   Fair Remote;   Fair  Judgement:  Fair  Insight:  Fair  Psychomotor Activity:   UTA  Concentration:  Concentration: Fair and Attention Span: Fair  Recall:  [employer] of Knowledge: Fair  Language: Fair  Akathisia:  No  Handed:  Right  AIMS (if indicated): done, 0  Assets:  Communication Skills Desire for Improvement Housing Social Support  ADL's:  Intact  Cognition: WNL  Sleep:   Overall OK   Screenings: PHQ2-9    Flowsheet Row Video Visit from 02/01/2021 in [facility]  PHQ-2 Total Score 0      Flowsheet Row Video Visit from 02/01/2021 in [facility] No Risk        Assessment and Plan: Sara Suarez is a 45 year old Caucasian female, employed, lives in [place], has a history of GAD, insomnia was evaluated by telemedicine today.  Patient is currently stable on current medication regimen.  Plan as noted below.  Plan GAD-stable Cymbalta 80 mg p.o. daily Continue CBT with Ms. Derrek Gu  Adjustment disorder with anxiety-improving Hydroxyzine 50 mg p.o. twice daily as needed Continue CBT  Insomnia-stable Ambien 7.5 mg p.o. nightly Continue CPAP, patient will follow up with CPAP provider  ADHD-follows up with [facility] attention specialist. Currently on Vyvanse  Follow-up in clinic in 3 months or sooner in person.   I have spent at least  18 minutes non face to face with patient  today.   This note was generated in part or whole with voice recognition software. Voice recognition is usually quite accurate but there are transcription errors that can and very often do occur. I apologize for any typographical errors that were not detected and corrected.     Sara Alert, MD 09/29/2021, 9:11 AM

## 2021-12-16 ENCOUNTER — Other Ambulatory Visit: Payer: Self-pay

## 2021-12-16 ENCOUNTER — Encounter: Payer: Self-pay | Admitting: Emergency Medicine

## 2021-12-16 ENCOUNTER — Ambulatory Visit
Admission: EM | Admit: 2021-12-16 | Discharge: 2021-12-16 | Disposition: A | Discharge status: Home / Self Care | Payer: 59

## 2021-12-16 DIAGNOSIS — R197 Diarrhea, unspecified: Secondary | ICD-10-CM

## 2021-12-16 NOTE — Discharge Instructions (Signed)
Your GI panel in office did not have enough stool for a sample, therefore he can bring a sample back by 6 PM today, you will be notified of any test results and treatment will be sent in at that time

## 2021-12-16 NOTE — ED Triage Notes (Signed)
Patient recently traveled to [place].  Patient states she started having diarrhea since Sunday.  Patient here for stool tests.

## 2021-12-16 NOTE — ED Provider Notes (Signed)
[facility]    CSN: [id number] Arrival date & time: 12/16/21  1347      History   Chief Complaint Chief Complaint  Patient presents with   Diarrhea    HPI Sara Suarez is a 46 y.o. female.   Patient presents with diarrhea for 6 days.  Diarrhea is described as loose with mucus, occurring approximately 5-10 times, varying in size. endorses that 7 days ago she arrived back from [place] .  Was seen by vaccination clinic who gave her azithromycin, endorse symptoms started to improve but then returned.  Denies blood in stool, nausea, vomiting, fevers, chills, body aches, URI symptoms.  Has not attempted treatment further.   Past Medical History:  Diagnosis Date   ADHD (attention deficit hyperactivity disorder)    Allergy    Anxiety    Depression     Patient Active Problem List   Diagnosis Date Noted   Bereavement 08/10/2021   Adjustment disorder with anxious mood 08/10/2021   Sprain of right wrist 03/02/2021   OSA (obstructive sleep apnea) 12/28/2020   Allergic rhinitis due to pollen 12/01/2020   Allergic rhinitis due to animal (cat) (dog) hair and dander 12/01/2020   Seafood allergy 11/25/2020   Chronic allergic conjunctivitis 11/25/2020   Attention deficit disorder (ADD) in adult 09/11/2019   Generalized anxiety disorder 07/09/2019   Insomnia due to mental condition 07/09/2019   Attention deficit disorder 07/09/2019   Mild intermittent asthma, uncomplicated 23/53/6144   Family history of breast cancer 08/01/2016   Tremor 08/01/2016   Allergic rhinitis 08/31/2014   Bruxism 08/31/2014   Headache, migraine 08/31/2014   Colpospasm 08/31/2014   Back ache 07/22/2014   Insomnia, persistent 07/22/2014   Depressive disorder 07/18/2014   Chronic constipation 07/18/2014   Routine general medical examination at a health care facility 04/16/2012    Past Surgical History:  Procedure Laterality Date   BREAST CYST ASPIRATION Right    neg   ENDOSCOPIC  PLANTAR FASCIOTOMY     FOOT SURGERY     MOUTH SURGERY     TUBAL LIGATION      OB History   No obstetric history on file.      Home Medications    Prior to Admission medications   Medication Sig Start Date End Date Taking? Authorizing Provider  DULoxetine (CYMBALTA) 20 MG capsule Take along with 60 mg daily 09/28/21  Yes Eappen, Ria Clock, MD  DULoxetine (CYMBALTA) 60 MG capsule Take 1 capsule (60 mg total) by mouth daily. Combine daily with 20 mg daily 09/28/21  Yes Eappen, Saramma, MD  VYVANSE 30 MG CHEW Chew 1 tablet by mouth every morning. 10/18/20  Yes [provider]  albuterol (VENTOLIN HFA) 108 (90 Base) MCG/ACT inhaler Inhale into the lungs. 08/19/20   [provider]  azelastine (ASTELIN) 0.1 % nasal spray azelastine 137 mcg (0.1 %) nasal spray aerosol    [provider]  Cetirizine HCl 10 MG CAPS Take by mouth.    [provider]  ciclopirox (LOPROX) 0.77 % cream Apply to both feet and between toes twice a day x 2 weeks. 03/22/21   Brendolyn Patty, MD  clobetasol cream (TEMOVATE) 0.05 % Apply to affected areas rash on hands and foot twice daily for flares until improved. Avoid face, groin, underarms. 02/09/21   Brendolyn Patty, MD  Crisaborole (EUCRISA) 2 % OINT Apply to affected areas hands and feet 1-2 times a day for maintenance. 02/09/21   Brendolyn Patty, MD  cyclobenzaprine (FLEXERIL) 5  MG tablet Take by mouth. 07/30/15   [provider]  Diclofenac Sodium (PENNSAID) 2 % SOLN Pennsaid 20 mg/gram/actuation (2 %) topical soln in metered-dose pump  APPLY 2 PUMPS (40 MG) TO THE AFFECTED AREA BY TOPICAL ROUTE 2 TIMES PER DAY Patient not taking: Reported on 03/29/2021    [provider]  EPINEPHrine 0.3 mg/0.3 mL IJ SOAJ injection See admin instructions.    [provider]  hydrOXYzine (VISTARIL) 50 MG capsule TAKE 1 CAPSULE (50 MG TOTAL) BY MOUTH 2 (TWO) TIMES DAILY AS NEEDED. 09/02/21   Ursula Alert, MD  ibuprofen (ADVIL)  800 MG tablet Take 800 mg by mouth 3 (three) times daily. 08/11/21   [provider]  ibuprofen (ADVIL,MOTRIN) 200 MG tablet Take by mouth.    [provider]  ketotifen (ZADITOR) 0.025 % ophthalmic solution 1 drop into affected eye    [provider]  Lisdexamfetamine Dimesylate (VYVANSE) 30 MG CHEW  10/18/20   [provider]  meloxicam (MOBIC) 15 MG tablet Take 1 tablet by mouth daily. Patient not taking: Reported on 05/05/2021 03/29/21   [provider]  promethazine (PHENERGAN) 25 MG tablet Take 25 mg by mouth every 6 (six) hours as needed. 09/15/19   [provider]  spironolactone (ALDACTONE) 50 MG tablet Take 1-2 tablets by mouth daily for acne. 06/27/21   Brendolyn Patty, MD  SUMAtriptan (IMITREX) 50 MG tablet Take by mouth. 08/01/17   [provider]  triamcinolone (NASACORT) 55 MCG/ACT AERO nasal inhaler Nasal Allergy 55 mcg spray aerosol  2 SPRAYS ONCE A DAY NASALLY 30 DAYS    [provider]  VITAMIN D PO Take by mouth.    [provider]  zolpidem (AMBIEN) 5 MG tablet Take 1-1.5 tablets (5-7.5 mg total) by mouth at bedtime as needed for sleep. 08/10/21   Ursula Alert, MD    Family History Family History  Problem Relation Age of Onset   Breast cancer Mother 110   Anxiety disorder Mother    Breast cancer Maternal Aunt 35   Anxiety disorder Sister    Panic disorder Sister    Dementia Maternal Grandmother    Dementia Paternal Grandmother     Social History Social History   Tobacco Use   Smoking status: Never   Smokeless tobacco: Never  Vaping Use   Vaping Use: Never used  Substance Use Topics   Alcohol use: Never   Drug use: Never     Allergies   Atomoxetine, Guanfacine, Chlorpheniramine-pseudoeph, Pseudoephedrine, Shellfish allergy, Hydrocodone, and Prednisone   Review of Systems Review of Systems  Constitutional: Negative.   HENT: Negative.    Eyes: Negative.   Respiratory: Negative.     Cardiovascular: Negative.   Gastrointestinal:  Positive for diarrhea. Negative for abdominal distention, abdominal pain, anal bleeding, blood in stool, constipation, nausea, rectal pain and vomiting.  Skin: Negative.     Physical Exam Triage Vital Signs ED Triage Vitals  Enc Vitals Group     BP 12/16/21 1447 (!) 149/104     Pulse Rate 12/16/21 1447 (!) 103     Resp 12/16/21 1447 14     Temp 12/16/21 1447 98.3 F (36.8 C)     Temp Source 12/16/21 1447 Oral     SpO2 12/16/21 1447 96 %     Weight 12/16/21 1445 170 lb 10.2 oz (77.4 kg)     Height 12/16/21 1445 $RemoveBefor'5\' 4"'DajDJIoBiNRU$  (1.626 m)     Head Circumference --  Peak Flow --      Pain Score 12/16/21 1444 0     Pain Loc --      Pain Edu? --      Excl. in [place]? --    No data found.  Updated Vital Signs BP (!) 149/104 (BP Location: Left Arm)    Pulse (!) 103    Temp 98.3 F (36.8 C) (Oral)    Resp 14    Ht $R'5\' 4"'AF$  (1.626 m)    Wt 170 lb 10.2 oz (77.4 kg)    LMP 10/29/2021 (Approximate)    SpO2 96%    BMI 29.29 kg/m   Visual Acuity Right Eye Distance:   Left Eye Distance:   Bilateral Distance:    Right Eye Near:   Left Eye Near:    Bilateral Near:     Physical Exam Constitutional:      Appearance: Normal appearance.  HENT:     Head: Normocephalic.  Eyes:     Extraocular Movements: Extraocular movements intact.  Pulmonary:     Effort: Pulmonary effort is normal.  Abdominal:     General: Abdomen is flat. Bowel sounds are normal. There is no distension.     Palpations: Abdomen is soft.     Tenderness: There is no abdominal tenderness. There is no right CVA tenderness or left CVA tenderness.  Skin:    General: Skin is warm and dry.  Neurological:     Mental Status: She is alert and oriented to person, place, and time.  Psychiatric:        Mood and Affect: Mood normal.        Behavior: Behavior normal.     UC Treatments / Results  Labs (all labs ordered are listed, but only abnormal results are displayed) Labs Reviewed  - No data to display  EKG   Radiology No results found.  Procedures Procedures (including critical care time)  Medications Ordered in UC Medications - No data to display  Initial Impression / Assessment and Plan / UC Course  I have reviewed the triage vital signs and the nursing notes.  Pertinent labs & imaging results that were available during my care of the patient were reviewed by me and considered in my medical decision making (see chart for details).  Diarrhea unspecified  Vital signs are stable, no tenderness noted on exam, low suspicion for acute abdomen, attempted to provide stool sample in office for GI panel, sample inefficient, given kit to return back to clinic within the next 1 to 2 days for further evaluation, will base treatment on results, discussed with patient Final Clinical Impressions(s) / UC Diagnoses   Final diagnoses:  None   Discharge Instructions   None    ED Prescriptions   None    PDMP not reviewed this encounter.   Hans Eden, NP 12/16/21 1601

## 2021-12-29 ENCOUNTER — Ambulatory Visit (INDEPENDENT_AMBULATORY_CARE_PROVIDER_SITE_OTHER): Payer: 59 | Admitting: Psychiatry

## 2021-12-29 ENCOUNTER — Encounter: Payer: Self-pay | Admitting: Psychiatry

## 2021-12-29 ENCOUNTER — Other Ambulatory Visit: Payer: Self-pay

## 2021-12-29 VITALS — BP 140/88 | HR 92 | Temp 98.1°F | Ht 64.0 in | Wt 207.0 lb

## 2021-12-29 DIAGNOSIS — F908 Attention-deficit hyperactivity disorder, other type: Secondary | ICD-10-CM | POA: Diagnosis not present

## 2021-12-29 DIAGNOSIS — F411 Generalized anxiety disorder: Secondary | ICD-10-CM

## 2021-12-29 DIAGNOSIS — F5105 Insomnia due to other mental disorder: Secondary | ICD-10-CM | POA: Diagnosis not present

## 2021-12-29 MED ORDER — BUSPIRONE HCL 7.5 MG PO TABS: 7.5000 mg | ORAL_TABLET | Freq: Every day | ORAL | 1 refills | Status: DC

## 2021-12-29 NOTE — Progress Notes (Signed)
[place] [facility] MD OP Progress Note  12/30/2021 8:13 AM Larry Knipp  MRN:  [id number]  Chief Complaint:  Chief Complaint  Patient presents with   Follow-up 46 year old Caucasian female with history of GAD, insomnia, ADHD was evaluated in office today for medication management.   HPI: Ajwa Kimberley is a 46 year old Caucasian female, lives in [place], has a history of GAD, insomnia, ADHD was evaluated in office today.  Patient reports she continues to struggle with anxiety, racing thoughts.  Patient reports she took a lot of break from work recently traveling for pleasure as well as for work.  She hence has a lot of assignments and projects that she needs to complete.  That does make her anxious.  She reports she hence has been having nervousness, feeling on edge and so on, likely getting worse.  Patient denies any sadness, crying spells.  Sleep is good when she takes the zolpidem as needed.  Reports attention and focus is good as long as she takes her Vyvanse, prescribed by [facility] attention specialist.  Denies suicidality, homicidality or perceptual disturbances.  Patient denies any other concerns today.  Visit Diagnosis:    ICD-10-CM   1. Generalized anxiety disorder  F41.1 busPIRone (BUSPAR) 7.5 MG tablet    2. Insomnia due to mental condition  F51.05    mood    3. Attention deficit hyperactivity disorder (ADHD), other type  F90.8       Past Psychiatric History: Reviewed past psychiatric history from progress note on 07/09/2019.  Past trials of Cymbalta, Klonopin, Ambien.  Past Medical History:  Past Medical History:  Diagnosis Date   ADHD (attention deficit hyperactivity disorder)    Allergy    Anxiety    Depression     Past Surgical History:  Procedure Laterality Date   BREAST CYST ASPIRATION Right    neg   ENDOSCOPIC PLANTAR FASCIOTOMY     FOOT SURGERY     MOUTH SURGERY     TUBAL LIGATION      Family Psychiatric History: Reviewed family psychiatric history  from progress note on 07/09/2019.  Family History:  Family History  Problem Relation Age of Onset   Breast cancer Mother 72   Anxiety disorder Mother    Breast cancer Maternal Aunt 28   Anxiety disorder Sister    Panic disorder Sister    Dementia Maternal Grandmother    Dementia Paternal Grandmother     Social History: Reviewed social history from progress note on 07/09/2019. Social History   Socioeconomic History   Marital status: Married    Spouse name: jon   Number of children: 0   Years of education: Not on file   Highest education level: Bachelor's degree (e.g., BA, AB, BS)  Occupational History   Not on file  Tobacco Use   Smoking status: Never   Smokeless tobacco: Never  Vaping Use   Vaping Use: Never used  Substance and Sexual Activity   Alcohol use: Never   Drug use: Never   Sexual activity: Not on file  Other Topics Concern   Not on file  Social History Narrative   Not on file   Social Determinants of Health   Financial Resource Strain: Not on file  Food Insecurity: Not on file  Transportation Needs: Not on file  Physical Activity: Not on file  Stress: Not on file  Social Connections: Not on file    Allergies:  Allergies  Allergen Reactions   Atomoxetine Other (See Comments)    Heartburn  Guanfacine Other (See Comments)    drowsiness   Chlorpheniramine-Pseudoeph     Hyperactive   Pseudoephedrine Other (See Comments)    Hyperactive Other reaction(s): insomnia, shakey   Shellfish Allergy Other (See Comments)   Hydrocodone Itching   Prednisone Anxiety    insomnia    Metabolic Disorder Labs: No results found for: HGBA1C, MPG No results found for: PROLACTIN No results found for: CHOL, TRIG, HDL, CHOLHDL, VLDL, LDLCALC No results found for: TSH  Therapeutic Level Labs: No results found for: LITHIUM No results found for: VALPROATE No components found for:  CBMZ  Current Medications: Current Outpatient Medications  Medication Sig  Dispense Refill   albuterol (VENTOLIN HFA) 108 (90 Base) MCG/ACT inhaler Inhale into the lungs.     atovaquone-proguanil (MALARONE) 250-100 MG TABS tablet Take 1 tablet by mouth daily.     azelastine (ASTELIN) 0.1 % nasal spray azelastine 137 mcg (0.1 %) nasal spray aerosol     busPIRone (BUSPAR) 7.5 MG tablet Take 1 tablet (7.5 mg total) by mouth daily. 30 tablet 1   Cetirizine HCl 10 MG CAPS Take by mouth.     cyclobenzaprine (FLEXERIL) 5 MG tablet Take by mouth.     DULoxetine (CYMBALTA) 20 MG capsule Take along with 60 mg daily 90 capsule 1   DULoxetine (CYMBALTA) 60 MG capsule Take 1 capsule (60 mg total) by mouth daily. Combine daily with 20 mg daily 90 capsule 1   EPINEPHrine 0.3 mg/0.3 mL IJ SOAJ injection See admin instructions.     hydrOXYzine (VISTARIL) 50 MG capsule TAKE 1 CAPSULE (50 MG TOTAL) BY MOUTH 2 (TWO) TIMES DAILY AS NEEDED. 180 capsule 1   ibuprofen (ADVIL) 800 MG tablet Take 800 mg by mouth 3 (three) times daily.     meloxicam (MOBIC) 15 MG tablet Take 1 tablet by mouth daily.     spironolactone (ALDACTONE) 50 MG tablet Take 1-2 tablets by mouth daily for acne. 60 tablet 1   SUMAtriptan (IMITREX) 50 MG tablet Take by mouth.     triamcinolone (NASACORT) 55 MCG/ACT AERO nasal inhaler Nasal Allergy 55 mcg spray aerosol  2 SPRAYS ONCE A DAY NASALLY 30 DAYS     VYVANSE 30 MG CHEW Chew 1 tablet by mouth every morning.     zolpidem (AMBIEN) 5 MG tablet Take 1-1.5 tablets (5-7.5 mg total) by mouth at bedtime as needed for sleep. 45 tablet 3   azithromycin (ZITHROMAX) 500 MG tablet Take 500 mg by mouth daily as needed. (Patient not taking: Reported on 12/29/2021)     ciclopirox (LOPROX) 0.77 % cream Apply to both feet and between toes twice a day x 2 weeks. (Patient not taking: Reported on 12/29/2021) 90 g 1   clobetasol cream (TEMOVATE) 0.05 % Apply to affected areas rash on hands and foot twice daily for flares until improved. Avoid face, groin, underarms. (Patient not taking:  Reported on 12/29/2021) 60 g 1   Crisaborole (EUCRISA) 2 % OINT Apply to affected areas hands and feet 1-2 times a day for maintenance. (Patient not taking: Reported on 12/29/2021) 60 g 2   Diclofenac Sodium (PENNSAID) 2 % SOLN Pennsaid 20 mg/gram/actuation (2 %) topical soln in metered-dose pump  APPLY 2 PUMPS (40 MG) TO THE AFFECTED AREA BY TOPICAL ROUTE 2 TIMES PER DAY (Patient not taking: Reported on 03/29/2021)     ibuprofen (ADVIL,MOTRIN) 200 MG tablet Take by mouth. (Patient not taking: Reported on 12/29/2021)     ketotifen (ZADITOR) 0.025 % ophthalmic solution 1  drop into affected eye (Patient not taking: Reported on 12/29/2021)     Lisdexamfetamine Dimesylate (VYVANSE) 30 MG CHEW  (Patient not taking: Reported on 12/29/2021)     promethazine (PHENERGAN) 25 MG tablet Take 25 mg by mouth every 6 (six) hours as needed. (Patient not taking: Reported on 12/29/2021)     VITAMIN D PO Take by mouth. (Patient not taking: Reported on 12/29/2021)     No current facility-administered medications for this visit.     Musculoskeletal: Strength & Muscle Tone: within normal limits Gait & Station: normal Patient leans: N/A  Psychiatric Specialty Exam: Review of Systems  Psychiatric/Behavioral:  The patient is nervous/anxious.   All other systems reviewed and are negative.  Blood pressure 140/88, pulse 92, temperature 98.1 F (36.7 C), height 5\' 4"  (1.626 m), weight 207 lb (93.9 kg), SpO2 98 %.Body mass index is 35.53 kg/m.  General Appearance: Casual  Eye Contact:  Fair  Speech:  Clear and Coherent  Volume:  Normal  Mood:  Anxious  Affect:  Congruent  Thought Process:  Goal Directed and Descriptions of Associations: Intact  Orientation:  Full (Time, Place, and Person)  Thought Content: Logical   Suicidal Thoughts:  No  Homicidal Thoughts:  No  Memory:  Immediate;   Fair Recent;   Fair Remote;   Fair  Judgement:  Fair  Insight:  Fair  Psychomotor Activity:  Normal  Concentration:   Concentration: Fair and Attention Span: Fair  Recall:  [employer] of Knowledge: Fair  Language: Fair  Akathisia:  No  Handed:  Right  AIMS (if indicated): done,0  Assets:  Communication Skills Desire for Improvement Social Support  ADL's:  Intact  Cognition: WNL  Sleep:  Fair   Screenings: GAD-7    [facility] Office Visit from 12/29/2021 in [facility]  Total GAD-7 Score 11      PHQ2-9    [facility] Visit from 12/29/2021 in [place] Video Visit from 02/01/2021 in [place]  PHQ-2 Total Score 0 0      [facility] Office Visit from 12/29/2021 in [place] [facility] from 12/16/2021 in [facility]  Video Visit from 02/01/2021 in [place] No Risk No Risk No Risk        Assessment and Plan: Yaileen Hofferber is a 46 year old Caucasian female, employed, lives in [place], married, has a history of GAD, insomnia was evaluated in office today.  Patient with recent work-related stressors, does report worsening anxiety and will benefit from following plan.  Plan GAD-unstable Add BuSpar 7.5 mg p.o. daily. Continue Cymbalta 80 mg p.o. daily Continue CBT with Ms. Sonja Carter  Insomnia-stable Ambien 7.5 mg p.o. nightly Continue CPAP.  ADHD-currently being followed by [place] attention specialist.  Currently on Vyvanse.  Follow-up in clinic in 2 months or sooner if needed.   Collaboration of Care: Collaboration of Care: Referral or follow-up with counselor/therapist AEB encouraged follow-up with  therapist.  Patient/Guardian was advised Release of Information must be obtained prior to any record release in order to collaborate their care with an outside provider. Patient/Guardian was advised if they have not already done so to contact the registration department to sign all  necessary forms in order for [place] to release information regarding their care.   Consent: Patient/Guardian gives verbal consent for treatment and assignment of benefits for services provided during this visit. Patient/Guardian expressed understanding  and agreed to proceed.   This note was generated in part or whole with voice recognition software. Voice recognition is usually quite accurate but there are transcription errors that can and very often do occur. I apologize for any typographical errors that were not detected and corrected.      Ursula Alert, MD 12/30/2021, 1:34 PM

## 2021-12-29 NOTE — Patient Instructions (Signed)
Buspirone Tablets What is this medication? BUSPIRONE (byoo SPYE rone) treats anxiety. It works by balancing the levels of dopamine and serotonin in your brain, hormones that help regulate mood. This medicine may be used for other purposes; ask your health care provider or pharmacist if you have questions. COMMON BRAND NAME(S): BuSpar, Buspar Dividose What should I tell my care team before I take this medication? They need to know if you have any of these conditions: Kidney or liver disease An unusual or allergic reaction to buspirone, other medications, foods, dyes, or preservatives Pregnant or trying to get pregnant Breast-feeding How should I use this medication? Take this medication by mouth with a glass of water. Follow the directions on the prescription label. You may take this medication with or without food. To ensure that this medication always works the same way for you, you should take it either always with or always without food. Take your doses at regular intervals. Do not take your medication more often than directed. Do not stop taking except on the advice of your care team. Talk to your care team about the use of this medication in children. Special care may be needed. Overdosage: If you think you have taken too much of this medicine contact a poison control center or emergency room at once. NOTE: This medicine is only for you. Do not share this medicine with others. What if I miss a dose? If you miss a dose, take it as soon as you can. If it is almost time for your next dose, take only that dose. Do not take double or extra doses. What may interact with this medication? Do not take this medication with any of the following: Linezolid MAOIs like Carbex, Eldepryl, Marplan, Nardil, and Parnate Methylene blue Procarbazine This medication may also interact with the following: Diazepam Digoxin Diltiazem Erythromycin Grapefruit juice Haloperidol Medications for mental  depression or mood problems Medications for seizures like carbamazepine, phenobarbital and phenytoin Nefazodone Other medications for anxiety Rifampin Ritonavir Some antifungal medications like itraconazole, ketoconazole, and voriconazole Verapamil Warfarin This list may not describe all possible interactions. Give your health care provider a list of all the medicines, herbs, non-prescription drugs, or dietary supplements you use. Also tell them if you smoke, drink alcohol, or use illegal drugs. Some items may interact with your medicine. What should I watch for while using this medication? Visit your care team for regular checks on your progress. It may take 1 to 2 weeks before your anxiety gets better. You may get drowsy or dizzy. Do not drive, use machinery, or do anything that needs mental alertness until you know how this medication affects you. Do not stand or sit up quickly, especially if you are an older patient. This reduces the risk of dizzy or fainting spells. Alcohol can make you more drowsy and dizzy. Avoid alcoholic drinks. What side effects may I notice from receiving this medication? Side effects that you should report to your care team as soon as possible: Allergic reactions--skin rash, itching, hives, swelling of the face, lips, tongue, or throat Irritability, confusion, fast or irregular heartbeat, muscle stiffness, twitching muscles, sweating, high fever, seizure, chills, vomiting, diarrhea, which may be signs of serotonin syndrome Side effects that usually do not require medical attention (report to your care team if they continue or are bothersome): Anxiety or nervousness Dizziness Drowsiness Headache Nausea Trouble sleeping This list may not describe all possible side effects. Call your doctor for medical advice about side effects. You may report  side effects to FDA at 1-800-FDA-1088. Where should I keep my medication? Keep out of the reach of children. Store at room  temperature below 30 degrees C (86 degrees F). Protect from light. Keep container tightly closed. Throw away any unused medication after the expiration date. NOTE: This sheet is a summary. It may not cover all possible information. If you have questions about this medicine, talk to your doctor, pharmacist, or health care provider.  2022 Elsevier/Gold Standard (2021-01-27 00:00:00)

## 2022-01-03 ENCOUNTER — Ambulatory Visit: Payer: 59 | Admitting: Dermatology

## 2022-01-03 ENCOUNTER — Other Ambulatory Visit: Payer: Self-pay

## 2022-01-03 DIAGNOSIS — L700 Acne vulgaris: Secondary | ICD-10-CM

## 2022-01-03 DIAGNOSIS — L2089 Other atopic dermatitis: Secondary | ICD-10-CM

## 2022-01-03 DIAGNOSIS — Z79899 Other long term (current) drug therapy: Secondary | ICD-10-CM | POA: Diagnosis not present

## 2022-01-03 MED ORDER — CLINDAMYCIN PHOS-BENZOYL PEROX 1.2-5 % EX GEL: 1.0000 "application " | Freq: Every day | CUTANEOUS | 4 refills | Status: DC

## 2022-01-03 MED ORDER — DUPIXENT 300 MG/2ML ~~LOC~~ SOAJ: 300.0000 mg | SUBCUTANEOUS | 6 refills | Status: DC

## 2022-01-03 MED ORDER — DUPIXENT 300 MG/2ML ~~LOC~~ SOAJ: 600.0000 mg | Freq: Once | SUBCUTANEOUS | 0 refills | Status: AC

## 2022-01-03 MED ORDER — TACROLIMUS 0.1 % EX OINT: TOPICAL_OINTMENT | CUTANEOUS | 2 refills | Status: DC

## 2022-01-03 NOTE — Progress Notes (Signed)
Follow-Up Visit   Subjective  Sara Suarez is a 46 y.o. female who presents for the following: Acne (Face, Spironolactone 50mg  1 po qd, sometimes 2 po qd, period has been continuous over past few months) and Dyshidrotic eczema (Bil hands, R foot, Eucrisa oint bid, clobetasol cream bid, hx of allergies and allergy shots for 20+ years). R foot still very itchy, gets blisters and pops them. Tried ketoconazole cream on foot which didn't help.   The following portions of the chart were reviewed this encounter and updated as appropriate:       Review of Systems:  No other skin or systemic complaints except as noted in HPI or Assessment and Plan.  Objective  Well appearing patient in no apparent distress; mood and affect are within normal limits.  A focused examination was performed including face, hands, right foot. Relevant physical exam findings are noted in the Assessment and Plan.  face Few inflamed comedones cheeks, closed comedones forehead  bil hands, right foot R plantar foot mild erythema with hyperkeratosis with dried scattered vesicles with crusting, firm flesh scaly paps L central palm    Assessment & Plan  Acne vulgaris face   BP today 155/97 D/C Spironolactone 50mg   due to irregularity in menses, recommend pt discuss with GYN Start Duac gel qd  Benzoyl peroxide can cause dryness and irritation of the skin. It can also bleach fabric. When used together with Aczone (dapsone) cream, it can stain the skin orange.   Clindamycin-Benzoyl Per, Refr, (DUAC) gel - face Apply 1 application topically daily. Qd to face for acne  Related Medications spironolactone (ALDACTONE) 50 MG tablet Take 1-2 tablets by mouth daily for acne.  Other atopic dermatitis bil hands, right foot  Atopic Dyshidrotic hand/foot dermatitis. Chronic and persistent condition with duration or expected duration over one year. Condition is bothersome/symptomatic for patient. Currently flared.    Atopic dermatitis (eczema) is a chronic, relapsing, pruritic condition that can significantly affect quality of life. It is often associated with allergic rhinitis and/or asthma and can require treatment with topical medications, phototherapy, or in severe cases biologic injectable medication (Dupixent; Adbry) or Oral JAK inhibitors.   Discussed Dupixent injections Pending Insurance approval start Dupixent 300mg /55ml sq injections q 2 wks Start Tacrolimus 0.1% oint qd/bid aa hands, feet D/C Eucrisa oint Cont Clobetasol cr qd/bid aa hands/feet prn flares, avoid f/g/a   Dupilumab (Dupixent) is a treatment given by injection for adults and children with moderate-to-severe atopic dermatitis. Goal is control of skin condition, not cure. It is given as 2 injections at the first dose followed by 1 injection ever 2 weeks thereafter.  Young children are dosed monthly.  Potential side effects include allergic reaction, herpes infections, injection site reactions and conjunctivitis (inflammation of the eyes).  The use of Dupixent requires long term medication management, including periodic office visits.   Topical steroids (such as triamcinolone, fluocinolone, fluocinonide, mometasone, clobetasol, halobetasol, betamethasone, hydrocortisone) can cause thinning and lightening of the skin if they are used for too long in the same area. Your physician has selected the right strength medicine for your problem and area affected on the body. Please use your medication only as directed by your physician to prevent side effects.    tacrolimus (PROTOPIC) 0.1 % ointment - bil hands, right foot Apply topically as directed. Qd to bid to aa hands, right foot until clear, then prn flares  Dupilumab (DUPIXENT) 300 MG/2ML SOPN - bil hands, right foot Inject 300 mg into the  skin every 14 (fourteen) days. Starting at day 15 for maintenance.  Dupilumab (DUPIXENT) 300 MG/2ML SOPN - bil hands, right foot Inject 600 mg into  the skin once for 1 dose. On day 1.   Return in about 1 month (around 01/31/2022) for Atopic Derm.  I, Othelia Pulling, RMA, am acting as scribe for Brendolyn Patty, MD .  Documentation: I have reviewed the above documentation for accuracy and completeness, and I agree with the above.  Brendolyn Patty MD

## 2022-01-03 NOTE — Patient Instructions (Signed)

## 2022-01-19 ENCOUNTER — Other Ambulatory Visit: Payer: Self-pay | Admitting: Gerontology

## 2022-01-19 DIAGNOSIS — Z1231 Encounter for screening mammogram for malignant neoplasm of breast: Secondary | ICD-10-CM

## 2022-01-19 DIAGNOSIS — Z800 Family history of malignant neoplasm of digestive organs: Secondary | ICD-10-CM | POA: Insufficient documentation

## 2022-01-19 DIAGNOSIS — M461 Sacroiliitis, not elsewhere classified: Secondary | ICD-10-CM | POA: Insufficient documentation

## 2022-01-20 DIAGNOSIS — E538 Deficiency of other specified B group vitamins: Secondary | ICD-10-CM | POA: Insufficient documentation

## 2022-01-20 DIAGNOSIS — R7303 Prediabetes: Secondary | ICD-10-CM | POA: Insufficient documentation

## 2022-01-24 ENCOUNTER — Other Ambulatory Visit: Payer: Self-pay | Admitting: Psychiatry

## 2022-01-24 DIAGNOSIS — F411 Generalized anxiety disorder: Secondary | ICD-10-CM

## 2022-01-25 ENCOUNTER — Other Ambulatory Visit: Payer: Self-pay | Admitting: Physical Medicine & Rehabilitation

## 2022-01-25 DIAGNOSIS — G8929 Other chronic pain: Secondary | ICD-10-CM

## 2022-01-31 ENCOUNTER — Ambulatory Visit: Payer: 59 | Admitting: Dermatology

## 2022-02-06 ENCOUNTER — Ambulatory Visit
Admission: RE | Admit: 2022-02-06 | Discharge: 2022-02-06 | Disposition: A | Discharge status: Home / Self Care | Payer: 59 | Source: Ambulatory Visit | Attending: Physical Medicine & Rehabilitation | Admitting: Physical Medicine & Rehabilitation

## 2022-02-06 ENCOUNTER — Other Ambulatory Visit: Payer: Self-pay

## 2022-02-06 DIAGNOSIS — G8929 Other chronic pain: Secondary | ICD-10-CM | POA: Diagnosis present

## 2022-02-06 DIAGNOSIS — M5442 Lumbago with sciatica, left side: Secondary | ICD-10-CM | POA: Diagnosis not present

## 2022-02-08 ENCOUNTER — Other Ambulatory Visit: Payer: Self-pay

## 2022-02-08 ENCOUNTER — Ambulatory Visit: Payer: 59 | Admitting: Dermatology

## 2022-02-08 DIAGNOSIS — Z79899 Other long term (current) drug therapy: Secondary | ICD-10-CM | POA: Diagnosis not present

## 2022-02-08 DIAGNOSIS — L301 Dyshidrosis [pompholyx]: Secondary | ICD-10-CM

## 2022-02-08 DIAGNOSIS — L2089 Other atopic dermatitis: Secondary | ICD-10-CM

## 2022-02-08 MED ORDER — DUPILUMAB 300 MG/2ML ~~LOC~~ SOSY
600.0000 mg | PREFILLED_SYRINGE | Freq: Once | SUBCUTANEOUS | Status: AC
  Administered 2022-02-08: 600 mg via SUBCUTANEOUS

## 2022-02-08 MED ORDER — DUPIXENT 300 MG/2ML ~~LOC~~ SOAJ: 300.0000 mg | SUBCUTANEOUS | 6 refills | Status: DC

## 2022-02-08 NOTE — Progress Notes (Signed)
? ?  Follow-Up Visit ?  ?Subjective  ?Sara Suarez is a 46 y.o. female who presents for the following: Follow-up (Patient here today for 1 month atopic dermatitis/dyshidrotic eczema at hands and feet. Patient currently using clobetasol cream and tacrolimus but advises she has had no improvement. Patient has been approved for Dupixent. ). ? ? ?The following portions of the chart were reviewed this encounter and updated as appropriate:  ?  ?  ? ?Review of Systems:  No other skin or systemic complaints except as noted in HPI or Assessment and Plan. ? ?Objective  ?Well appearing patient in no apparent distress; mood and affect are within normal limits. ? ?A focused examination was performed including feet, hands. Relevant physical exam findings are noted in the Assessment and Plan. ? ?feet, hands ?Erythema with hyperkeratosis, multiple dried pustules R plantar foot>L plantar foot. ? ? ? ?Assessment & Plan  ?Dyshidrotic eczema ?feet, hands ? ?Chronic and persistent condition with duration or expected duration over one year. Condition is bothersome/symptomatic for patient. Currently flared.  ? ?Start Dupixent 300 mg/61m today for a total of 600 mg for loading dose. Samples used today.  ?Lot # [id number] Exp: 11/2023 ?Dupixent 300 mg/296minjected to left lower abdomen and 300 mg/9m39mnjected to left thigh for loading dose.  ?Patient has been approved for Dupixent and will do injections at home.  ? ?Continue clobetasol cream 1-2 times daily to feet and hands as needed. Avoid applying to face, groin, and axilla. Use as directed. Long-term use can cause thinning of the skin. ?Continue tacrolimus 1-2 times daily to feet and hands ?May restart Eucrisa 1-2 times daily to feet and hands ? ?Atopic dermatitis - Severe, on Dupixent (biologic medication).  ?Atopic dermatitis (eczema) is a chronic, relapsing, pruritic condition that can significantly affect quality of life. It is often associated with allergic rhinitis and/or  asthma and can require treatment with topical medications, phototherapy, or in severe cases a biologic medication called Dupixent, which requires long term medication management.   ? ?Topical steroids (such as triamcinolone, fluocinolone, fluocinonide, mometasone, clobetasol, halobetasol, betamethasone, hydrocortisone) can cause thinning and lightening of the skin if they are used for too long in the same area. Your physician has selected the right strength medicine for your problem and area affected on the body. Please use your medication only as directed by your physician to prevent side effects.  ? ?Dupilumab (Dupixent) is a treatment given by injection for adults and children with moderate-to-severe atopic dermatitis. Goal is control of skin condition, not cure. It is given as 2 injections at the first dose followed by 1 injection ever 2 weeks thereafter.  Young children are dosed monthly. ? ?Potential side effects include allergic reaction, herpes infections, injection site reactions and conjunctivitis (inflammation of the eyes).  The use of Dupixent requires long term medication management, including periodic office visits. ? ? ?Related Medications ?clobetasol cream (TEMOVATE) 0.05 % ?Apply to affected areas rash on hands and foot twice daily for flares until improved. Avoid face, groin, underarms. ? ?dupilumab (DUPIXENT) prefilled syringe 600 mg ? ? ? ?Return in about 3 months (around 05/11/2022) for Eczema. ? ?I,JGraciella BeltonMA, am acting as scribe for TarBrendolyn PattyD . ? ?

## 2022-02-08 NOTE — Patient Instructions (Addendum)
Continue clobetasol cream 1-2 times daily to feet and hands as needed. Avoid applying to face, groin, and axilla. Use as directed. Long-term use can cause thinning of the skin. ?Continue tacrolimus 1-2 times daily to feet and hands ?May restart Eucrisa 1-2 times daily to feet and hands ? ?Topical steroids (such as triamcinolone, fluocinolone, fluocinonide, mometasone, clobetasol, halobetasol, betamethasone, hydrocortisone) can cause thinning and lightening of the skin if they are used for too long in the same area. Your physician has selected the right strength medicine for your problem and area affected on the body. Please use your medication only as directed by your physician to prevent side effects.  ? ?If You Need Anything After Your Visit ? ?If you have any questions or concerns for your doctor, please call our main line at [phone] and press option 4 to reach your doctor's medical assistant. If no one answers, please leave a voicemail as directed and we will return your call as soon as possible. Messages left after 4 pm will be answered the following business day.  ? ?You may also send [place] a message via MyChart. We typically respond to MyChart messages within 1-2 business days. ? ?For prescription refills, please ask your pharmacy to contact our office. Our fax number is [phone]. ? ?If you have an urgent issue when the clinic is closed that cannot wait until the next business day, you can page your doctor at the number below.   ? ?Please note that while we do our best to be available for urgent issues outside of office hours, we are not available 24/7.  ? ?If you have an urgent issue and are unable to reach [place], you may choose to seek medical care at your doctor's office, retail clinic, urgent care center, or emergency room. ? ?If you have a medical emergency, please immediately call 911 or go to the emergency department. ? ?Pager Numbers ? ?- Dr. Nehemiah Massed: [phone] ? ?- Dr. Laurence Ferrari: [phone] ? ?-  Dr. Nicole Kindred: [phone] ? ?In the event of inclement weather, please call our main line at [phone] for an update on the status of any delays or closures. ? ?Dermatology Medication Tips: ?Please keep the boxes that topical medications come in in order to help keep track of the instructions about where and how to use these. Pharmacies typically print the medication instructions only on the boxes and not directly on the medication tubes.  ? ?If your medication is too expensive, please contact our office at [phone] option 4 or send [place] a message through [place].  ? ?We are unable to tell what your co-pay for medications will be in advance as this is different depending on your insurance coverage. However, we may be able to find a substitute medication at lower cost or fill out paperwork to get insurance to cover a needed medication.  ? ?If a prior authorization is required to get your medication covered by your insurance company, please allow [place] 1-2 business days to complete this process. ? ?Drug prices often vary depending on where the prescription is filled and some pharmacies may offer cheaper prices. ? ?The website www.goodrx.com contains coupons for medications through different pharmacies. The prices here do not account for what the cost may be with help from insurance (it may be cheaper with your insurance), but the website can give you the price if you did not use any insurance.  ?- You can print the associated coupon and take it with your prescription to the pharmacy.  ?-  You may also stop by our office during regular business hours and pick up a GoodRx coupon card.  ?- If you need your prescription sent electronically to a different pharmacy, notify our office through [facility] or by phone at [phone] option 4. ? ? ? ? ?Si Usted Necesita Algo Despu?s de Su Visita ? ?Tambi?n puede enviarnos un mensaje a trav?s de MyChart. Por lo general respondemos a los mensajes de MyChart en el  transcurso de 1 a 2 d?as h?biles. ? ?Para renovar recetas, por favor pida a su farmacia que se ponga en contacto con nuestra oficina. Nuestro n?mero de fax es el [phone]. ? ?Si tiene un asunto urgente cuando la cl?nica est? cerrada y que no puede esperar hasta el siguiente d?a h?bil, puede llamar/localizar a su doctor(a) al n?mero que aparece a continuaci?n.  ? ?Por favor, tenga en cuenta que aunque hacemos todo lo posible para estar disponibles para asuntos urgentes fuera del horario de oficina, no estamos disponibles las 24 horas del d?a, los 7 d?as de la semana.  ? ?Si tiene un problema urgente y no puede comunicarse con nosotros, puede optar por buscar atenci?n m?dica  en el consultorio de su doctor(a), en una cl?nica privada, en un centro de atenci?n urgente o en una sala de emergencias. ? ?Si tiene Engineer, maintenance (IT) m?dica, por favor llame inmediatamente al 911 o vaya a la sala de emergencias. ? ?N?meros de b?per ? ?- Dr. Nehemiah Massed: [phone] ? ?- Dra. Moye: [phone] ? ?- Dra. Nicole Kindred: [phone] ? ?En caso de inclemencias del tiempo, por favor llame a nuestra l?nea principal al [phone] para una actualizaci?n sobre el estado de cualquier retraso o cierre. ? ?Consejos para la medicaci?n en dermatolog?a: ?Por favor, guarde las cajas en las que vienen los medicamentos de uso t?pico para ayudarle a seguir las instrucciones sobre d?nde y c?mo usarlos. Las farmacias generalmente imprimen las instrucciones del medicamento s?lo en las cajas y no directamente en los tubos del [place].  ? ?Si su medicamento es muy caro, por favor, p?ngase en contacto con Zigmund Daniel llamando al [phone] y presione la opci?n 4 o env?enos un mensaje a trav?s de MyChart.  ? ?No podemos decirle cu?l ser? su copago por los medicamentos por adelantado ya que esto es diferente dependiendo de la cobertura de su seguro. Sin embargo, es posible que podamos encontrar un medicamento sustituto a Electrical engineer un  formulario para que el seguro cubra el medicamento que se considera necesario.  ? ?Si se requiere Ardelia Mems autorizaci?n previa para que su compa??a de seguros [place] su medicamento, por favor perm?tanos de 1 a 2 d?as h?biles para completar este proceso. ? ?Los precios de los medicamentos var?an con frecuencia dependiendo del Environmental consultant de d?nde se surte la receta y alguna farmacias pueden ofrecer precios m?s baratos. ? ?El sitio web www.goodrx.com tiene cupones para medicamentos de Airline pilot. Los precios aqu? no tienen en cuenta lo que podr?a costar con la ayuda del seguro (puede ser m?s barato con su seguro), pero el sitio web puede darle el precio si no utiliz? ning?n seguro.  ?- Puede imprimir el cup?n correspondiente y llevarlo con su receta a la farmacia.  ?- Tambi?n puede pasar por nuestra oficina durante el horario de atenci?n regular y recoger una tarjeta de cupones de GoodRx.  ?- Si necesita que su receta se env?e electr?nicamente a Chiropodist, informe a nuestra oficina a trav?s de MyChart de  o por tel?fono llamando al [phone] y  presione la opci?n 4. ? ?

## 2022-02-08 NOTE — Progress Notes (Signed)
Dupixent sent to speciality pharmacy per insurance. aw ?

## 2022-02-08 NOTE — Progress Notes (Signed)
? ?  Follow-Up Visit ?  ?Subjective  ?Sara Suarez is a 46 y.o. female who presents for the following: Follow-up (Patient here today for 1 month atopic dermatitis/dyshidrotic eczema at hands and feet. Patient currently using clobetasol cream and tacrolimus but advises she has had no improvement. Patient has been approved for Dupixent. ). ? ? ?The following portions of the chart were reviewed this encounter and updated as appropriate:  ?  ?  ? ?Review of Systems:  No other skin or systemic complaints except as noted in HPI or Assessment and Plan. ? ?Objective  ?Well appearing patient in no apparent distress; mood and affect are within normal limits. ? ?A focused examination was performed including feet, hands. Relevant physical exam findings are noted in the Assessment and Plan. ? ?feet, hands ?Erythema with hyperkeratosis, multiple dried pustules R plantar foot>L plantar foot. ? ? ? ?Assessment & Plan  ?Dyshidrotic eczema ?feet, hands ? ?Chronic and persistent condition with duration or expected duration over one year. Condition is bothersome/symptomatic for patient. Currently flared.  ? ?Start Dupixent 300 mg/45m today for a total of 600 mg for loading dose. Samples used today.  ?Lot # [id number] Exp: 11/2023 ?Dupixent 300 mg/270minjected to left lower abdomen and 300 mg/53m24mnjected to left thigh for loading dose.  ?Patient has been approved for Dupixent and will do injections at home.  ? ?Continue clobetasol cream 1-2 times daily to feet and hands as needed. Avoid applying to face, groin, and axilla. Use as directed. Long-term use can cause thinning of the skin. ?Continue tacrolimus 1-2 times daily to feet and hands ?May restart Eucrisa 1-2 times daily to feet and hands ? ?Atopic dermatitis - Severe, on Dupixent (biologic medication).  ?Atopic dermatitis (eczema) is a chronic, relapsing, pruritic condition that can significantly affect quality of life. It is often associated with allergic rhinitis and/or  asthma and can require treatment with topical medications, phototherapy, or in severe cases a biologic medication called Dupixent, which requires long term medication management.   ? ?Topical steroids (such as triamcinolone, fluocinolone, fluocinonide, mometasone, clobetasol, halobetasol, betamethasone, hydrocortisone) can cause thinning and lightening of the skin if they are used for too long in the same area. Your physician has selected the right strength medicine for your problem and area affected on the body. Please use your medication only as directed by your physician to prevent side effects.  ? ?Dupilumab (Dupixent) is a treatment given by injection for adults and children with moderate-to-severe atopic dermatitis. Goal is control of skin condition, not cure. It is given as 2 injections at the first dose followed by 1 injection ever 2 weeks thereafter.  Young children are dosed monthly. ? ?Potential side effects include allergic reaction, herpes infections, injection site reactions and conjunctivitis (inflammation of the eyes).  The use of Dupixent requires long term medication management, including periodic office visits. ? ? ?Related Medications ?clobetasol cream (TEMOVATE) 0.05 % ?Apply to affected areas rash on hands and foot twice daily for flares until improved. Avoid face, groin, underarms. ? ?dupilumab (DUPIXENT) prefilled syringe 600 mg ? ? ? ?Return in about 3 months (around 05/11/2022) for Eczema. ? ?I,JGraciella BeltonMA, am acting as scribe for TarBrendolyn PattyD . ? ?Documentation: I have reviewed the above documentation for accuracy and completeness, and I agree with the above. ? ?TarBrendolyn Patty  ? ?

## 2022-02-28 ENCOUNTER — Ambulatory Visit
Admission: RE | Admit: 2022-02-28 | Discharge: 2022-02-28 | Disposition: A | Discharge status: Home / Self Care | Payer: 59 | Source: Ambulatory Visit | Attending: Gerontology | Admitting: Gerontology

## 2022-02-28 DIAGNOSIS — Z1231 Encounter for screening mammogram for malignant neoplasm of breast: Secondary | ICD-10-CM | POA: Diagnosis present

## 2022-03-14 ENCOUNTER — Telehealth: Payer: Self-pay

## 2022-03-14 NOTE — Telephone Encounter (Signed)
Pt called she has a rash in the areas on her skin where she injected Dupixent injection, she think could be related the Dupixent injections,  ?Discussed with -pt we will work her in the office to see Dr Chauncey Cruel tomorrow at [facility]  ?

## 2022-03-15 ENCOUNTER — Other Ambulatory Visit: Payer: Self-pay | Admitting: Psychiatry

## 2022-03-15 ENCOUNTER — Ambulatory Visit: Payer: 59 | Admitting: Dermatology

## 2022-03-15 DIAGNOSIS — L301 Dyshidrosis [pompholyx]: Secondary | ICD-10-CM

## 2022-03-15 DIAGNOSIS — L309 Dermatitis, unspecified: Secondary | ICD-10-CM

## 2022-03-15 DIAGNOSIS — Z79899 Other long term (current) drug therapy: Secondary | ICD-10-CM | POA: Diagnosis not present

## 2022-03-15 DIAGNOSIS — F411 Generalized anxiety disorder: Secondary | ICD-10-CM

## 2022-03-15 NOTE — Patient Instructions (Addendum)
Discontinue Dupixent injections  ? ? ?For Rash at abdomen and left thigh ? ?Apply clobetasol cream and tacrolimus twice daily to affected areas of rash  ? ?Topical steroids (such as triamcinolone, fluocinolone, fluocinonide, mometasone, clobetasol, halobetasol, betamethasone, hydrocortisone) can cause thinning and lightening of the skin if they are used for too long in the same area. Your physician has selected the right strength medicine for your problem and area affected on the body. Please use your medication only as directed by your physician to prevent side effects.  ? ? ?For eczema at hands and feet ? ?Continue clobetasol cream 1-2 times daily to feet and hands as needed. Avoid applying to face, groin, and axilla. Use as directed. Long-term use can cause thinning of the skin. ?Continue tacrolimus 1-2 times daily to feet and hands ?May restart Eucrisa 1-2 times daily to feet and hands ? ? ? ?If You Need Anything After Your Visit ? ?If you have any questions or concerns for your doctor, please call our main line at [phone] and press option 4 to reach your doctor's medical assistant. If no one answers, please leave a voicemail as directed and we will return your call as soon as possible. Messages left after 4 pm will be answered the following business day.  ? ?You may also send [place] a message via MyChart. We typically respond to MyChart messages within 1-2 business days. ? ?For prescription refills, please ask your pharmacy to contact our office. Our fax number is [phone]. ? ?If you have an urgent issue when the clinic is closed that cannot wait until the next business day, you can page your doctor at the number below.   ? ?Please note that while we do our best to be available for urgent issues outside of office hours, we are not available 24/7.  ? ?If you have an urgent issue and are unable to reach [place], you may choose to seek medical care at your doctor's office, retail clinic, urgent care center, or  emergency room. ? ?If you have a medical emergency, please immediately call 911 or go to the emergency department. ? ?Pager Numbers ? ?- Dr. Nehemiah Massed: [phone] ? ?- Dr. Laurence Ferrari: [phone] ? ?- Dr. Nicole Kindred: [phone] ? ?In the event of inclement weather, please call our main line at [phone] for an update on the status of any delays or closures. ? ?Dermatology Medication Tips: ?Please keep the boxes that topical medications come in in order to help keep track of the instructions about where and how to use these. Pharmacies typically print the medication instructions only on the boxes and not directly on the medication tubes.  ? ?If your medication is too expensive, please contact our office at [phone] option 4 or send [place] a message through [place].  ? ?We are unable to tell what your co-pay for medications will be in advance as this is different depending on your insurance coverage. However, we may be able to find a substitute medication at lower cost or fill out paperwork to get insurance to cover a needed medication.  ? ?If a prior authorization is required to get your medication covered by your insurance company, please allow [place] 1-2 business days to complete this process. ? ?Drug prices often vary depending on where the prescription is filled and some pharmacies may offer cheaper prices. ? ?The website www.goodrx.com contains coupons for medications through different pharmacies. The prices here do not account for what the cost may be with help from insurance (it may  be cheaper with your insurance), but the website can give you the price if you did not use any insurance.  ?- You can print the associated coupon and take it with your prescription to the pharmacy.  ?- You may also stop by our office during regular business hours and pick up a GoodRx coupon card.  ?- If you need your prescription sent electronically to a different pharmacy, notify our office through [facility] or by phone at  [phone] option 4. ? ? ? ? ?Si Usted Necesita Algo Despu?s de Su Visita ? ?Tambi?n puede enviarnos un mensaje a trav?s de MyChart. Por lo general respondemos a los mensajes de MyChart en el transcurso de 1 a 2 d?as h?biles. ? ?Para renovar recetas, por favor pida a su farmacia que se ponga en contacto con nuestra oficina. Nuestro n?mero de fax es el [phone]. ? ?Si tiene un asunto urgente cuando la cl?nica est? cerrada y que no puede esperar hasta el siguiente d?a h?bil, puede llamar/localizar a su doctor(a) al n?mero que aparece a continuaci?n.  ? ?Por favor, tenga en cuenta que aunque hacemos todo lo posible para estar disponibles para asuntos urgentes fuera del horario de oficina, no estamos disponibles las 24 horas del d?a, los 7 d?as de la semana.  ? ?Si tiene un problema urgente y no puede comunicarse con nosotros, puede optar por buscar atenci?n m?dica  en el consultorio de su doctor(a), en una cl?nica privada, en un centro de atenci?n urgente o en una sala de emergencias. ? ?Si tiene Engineer, maintenance (IT) m?dica, por favor llame inmediatamente al 911 o vaya a la sala de emergencias. ? ?N?meros de b?per ? ?- Dr. Nehemiah Massed: [phone] ? ?- Dra. Moye: [phone] ? ?- Dra. Nicole Kindred: [phone] ? ?En caso de inclemencias del tiempo, por favor llame a nuestra l?nea principal al [phone] para una actualizaci?n sobre el estado de cualquier retraso o cierre. ? ?Consejos para la medicaci?n en dermatolog?a: ?Por favor, guarde las cajas en las que vienen los medicamentos de uso t?pico para ayudarle a seguir las instrucciones sobre d?nde y c?mo usarlos. Las farmacias generalmente imprimen las instrucciones del medicamento s?lo en las cajas y no directamente en los tubos del [place].  ? ?Si su medicamento es muy caro, por favor, p?ngase en contacto con Zigmund Daniel llamando al [phone] y presione la opci?n 4 o env?enos un mensaje a trav?s de MyChart.  ? ?No podemos decirle cu?l ser? su copago por los  medicamentos por adelantado ya que esto es diferente dependiendo de la cobertura de su seguro. Sin embargo, es posible que podamos encontrar un medicamento sustituto a Electrical engineer un formulario para que el seguro cubra el medicamento que se considera necesario.  ? ?Si se requiere Ardelia Mems autorizaci?n previa para que su compa??a de seguros [place] su medicamento, por favor perm?tanos de 1 a 2 d?as h?biles para completar este proceso. ? ?Los precios de los medicamentos var?an con frecuencia dependiendo del Environmental consultant de d?nde se surte la receta y alguna farmacias pueden ofrecer precios m?s baratos. ? ?El sitio web www.goodrx.com tiene cupones para medicamentos de Airline pilot. Los precios aqu? no tienen en cuenta lo que podr?a costar con la ayuda del seguro (puede ser m?s barato con su seguro), pero el sitio web puede darle el precio si no utiliz? ning?n seguro.  ?- Puede imprimir el cup?n correspondiente y llevarlo con su receta a la farmacia.  ?- Tambi?n puede pasar por nuestra oficina durante el horario de atenci?n regular y  recoger una tarjeta de cupones de GoodRx.  ?- Si necesita que su receta se env?e electr?nicamente a Chiropodist, informe a nuestra oficina a trav?s de MyChart de [place] o por tel?fono llamando al [phone] y presione la opci?n 4. ? ?

## 2022-03-15 NOTE — Progress Notes (Signed)
? ?Follow-Up Visit ?  ?Subjective  ?Sara Suarez is a 46 y.o. female who presents for the following: Rash (Patient reports itchy red rash that developed at abdomen and right upper thigh over 5 weeks ago after dupixent injection. Patient states dupixent seemed to help clear bumps at feet. She has been using ciclopirox cream and hydrocortisone cream to aa's with no relief. ).  Feet doing great- eczema has cleared, but now has itchy rashes in areas of Dupixent shots. ? ? ?The following portions of the chart were reviewed this encounter and updated as appropriate:   ?  ? ?Review of Systems: No other skin or systemic complaints except as noted in HPI or Assessment and Plan. ? ? ?Objective  ?Well appearing patient in no apparent distress; mood and affect are within normal limits. ? ?A focused examination was performed including abdomen, upper thighs, hands,. Relevant physical exam findings are noted in the Assessment and Plan. ? ?hands and feet ?mild erythema/scale- much improved ? ?abdomen and left thigh ?Circular pink scaly patches on abdomen and left thigh  ? ? ? ? ? ? ? ? ? ? ? 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 ?Chronic condition with duration or expected duration over one year. Much improved on Dupixent but getting severe injection site reactions ?  ?D/C Dupixent 300 mg/20m injection due to injection site reaction  ?Discussed may try Adbry but will wait until rash is clear  ?Patient will call when feet flared and will reevaluate then ? ?Continue clobetasol cream 1-2 times daily to feet and hands as needed. Avoid applying to face, groin, and axilla. Use as directed. Long-term use can cause thinning of the skin. ?Continue tacrolimus 1-2 times daily to feet and hands ?May restart Eucrisa 1-2 times daily to feet and hands ?  ?Atopic dermatitis - Severe, on Dupixent (biologic medication).  ?Atopic dermatitis (eczema) is a chronic, relapsing, pruritic condition that can significantly affect  quality of life. It is often associated with allergic rhinitis and/or asthma and can require treatment with topical medications, phototherapy, or in severe cases a biologic medication called Dupixent, which requires long term medication management.   ?  ?Topical steroids (such as triamcinolone, fluocinolone, fluocinonide, mometasone, clobetasol, halobetasol, betamethasone, hydrocortisone) can cause thinning and lightening of the skin if they are used for too long in the same area. Your physician has selected the right strength medicine for your problem and area affected on the body. Please use your medication only as directed by your physician to prevent side effects.  ?  ?Dupilumab (Dupixent) is a treatment given by injection for adults and children with moderate-to-severe atopic dermatitis. Goal is control of skin condition, not cure. It is given as 2 injections at the first dose followed by 1 injection ever 2 weeks thereafter.  Young children are dosed monthly. ?  ?Potential side effects include allergic reaction, herpes infections, injection site reactions and conjunctivitis (inflammation of the eyes).  The use of Dupixent requires long term medication management, including periodic office visits. ?  ? ?Related Medications ?clobetasol cream (TEMOVATE) 0.05 % ?Apply to affected areas rash on hands and foot twice daily for flares until improved. Avoid face, groin, underarms. ? ?Dermatitis ?abdomen and left thigh ? ? Injection site reaction to Dupixent  ? ?Will D/C Dupixent ? ?Recommend applying  clobetasol cream and tacrolimus twice daily to affected areas of rash  ? ?Topical steroids (such as triamcinolone, fluocinolone, fluocinonide, mometasone, clobetasol, halobetasol, betamethasone, hydrocortisone) can cause thinning  and lightening of the skin if they are used for too long in the same area. Your physician has selected the right strength medicine for your problem and area affected on the body. Please use your  medication only as directed by your physician to prevent side effects.  ? ? ?Return for patient prefers to call when flares due to high copay. ? ?I, Ruthell Rummage, CMA, am acting as scribe for Brendolyn Patty, MD. ? ?Documentation: I have reviewed the above documentation for accuracy and completeness, and I agree with the above. ? ?Brendolyn Patty MD  ?

## 2022-03-27 ENCOUNTER — Ambulatory Visit: Payer: 59 | Admitting: Dermatology

## 2022-03-29 ENCOUNTER — Other Ambulatory Visit: Payer: Self-pay | Admitting: Psychiatry

## 2022-03-29 DIAGNOSIS — F411 Generalized anxiety disorder: Secondary | ICD-10-CM

## 2022-05-15 ENCOUNTER — Telehealth: Payer: Self-pay

## 2022-05-15 DIAGNOSIS — F411 Generalized anxiety disorder: Secondary | ICD-10-CM

## 2022-05-15 DIAGNOSIS — F5105 Insomnia due to other mental disorder: Secondary | ICD-10-CM

## 2022-05-15 NOTE — Telephone Encounter (Signed)
Medication management - Telephone call with pt, after she left a voice message she is in need of a new appointment and would also like to discuss with Dr. Shea Evans to possibly stop taking Buspirone.  Pt wanted to let Dr. Shea Evans know as well another provider started her on Gabapentin 4 days ago and wants to know if Dr. Shea Evans is in agreement with that medication with her current other medications.  Informed Dr. Shea Evans was out until 05/17/22 and pt reported she did not want to speak to a covering provider but would rather wait and speak to Dr. Shea Evans about her medications upon her return on 05/17/22.  Informed she would be called back to schedule an appointment and would send the request to Dr. Shea Evans to call her back on 05/17/22 to discuss her medications.  Pt was not sure why she did not get scheduled after last appt but would like a new appointment and also requested Dr. Shea Evans send in her a new  Ambien order if she agreed upon her return.

## 2022-05-17 MED ORDER — DULOXETINE HCL 20 MG PO CPEP: ORAL_CAPSULE | ORAL | 1 refills | Status: DC

## 2022-05-17 MED ORDER — ZOLPIDEM TARTRATE 5 MG PO TABS: 5.0000 mg | ORAL_TABLET | Freq: Every evening | ORAL | 1 refills | Status: DC | PRN

## 2022-05-17 NOTE — Telephone Encounter (Signed)
Returned call to patient.  Discussed stopping the BuSpar since she is currently on Cymbalta and gabapentin.  Patient prefers to stop this medication.  She would also like to reduce the Cymbalta dosage however advised to wait until her next visit.  We will send her refills for her zolpidem and Cymbalta 20 mg to the pharmacy.

## 2022-05-24 ENCOUNTER — Ambulatory Visit: Payer: 59 | Admitting: Dermatology

## 2022-05-24 ENCOUNTER — Encounter: Payer: Self-pay | Admitting: Dermatology

## 2022-05-24 ENCOUNTER — Other Ambulatory Visit: Payer: Self-pay | Admitting: Dermatology

## 2022-05-24 DIAGNOSIS — L810 Postinflammatory hyperpigmentation: Secondary | ICD-10-CM | POA: Diagnosis not present

## 2022-05-24 DIAGNOSIS — Z79899 Other long term (current) drug therapy: Secondary | ICD-10-CM

## 2022-05-24 DIAGNOSIS — L301 Dyshidrosis [pompholyx]: Secondary | ICD-10-CM

## 2022-05-24 MED ORDER — RINVOQ 15 MG PO TB24: 1.0000 | ORAL_TABLET | Freq: Every day | ORAL | 2 refills | Status: DC

## 2022-05-24 NOTE — Progress Notes (Signed)
   Follow-Up Visit   Subjective  Sara Suarez is a 46 y.o. female who presents for the following: Eczema (Recheck dyshidrotic dermatitis on hands and feet. Dupixent cleared this condition but patient had severe injection site reactions at all injection sites so had to D/C. Here to discuss other treatment options. Has been using Clobetasol as directed. Feet are currently flaring and painful).    The following portions of the chart were reviewed this encounter and updated as appropriate:      Review of Systems: No other skin or systemic complaints except as noted in HPI or Assessment and Plan.   Objective  Well appearing patient in no apparent distress; mood and affect are within normal limits.  A focused examination was performed including face, abdomen, hands, legs, feet. Relevant physical exam findings are noted in the Assessment and Plan.  Right Foot - Posterior Crusted excoriations at right plantar foot,  erythema with deep seated papules at palms  B/L abdomen, thighs Light violaceous/hyperpigmented round patches in areas of previous injection sites   Assessment & Plan  Dyshidrotic dermatitis Right Foot - Posterior  Chronic and persistent condition with duration or expected duration over one year. Condition is symptomatic / bothersome to patient. Not to goal.  Atopic dermatitis (eczema) is a chronic, relapsing, pruritic condition that can significantly affect quality of life. It is often associated with allergic rhinitis and/or asthma and can require treatment with topical medications, phototherapy, or in severe cases biologic injectable medication (Dupixent; Adbry) or Oral JAK inhibitors.   Discussed treatment options for severe eczema: Adbry, Rinvoq (oral Jak inhibitor), including black box warning for Rinvoq for rare Mace events.  Pt has no cardiac/clotting risk factors.   Reviewed recent labs 01/19/22 CBC/diff, CMP, Lipid panel.  No hx of TB, or hepatitis per patient.    Patient prefers to start with oral medication at this point since had severe injection reactions to Dupixent, and Mikal Plane is twice the number of shots. Will recheck labs in 3 months.  Plan to start Rinvoq '15mg'$  PO once daily pending negative TB test.  Samples given today.  Continue clobetasol cream 1-2 times daily to feet and hands as needed. Avoid applying to face, groin, and axilla. Use as directed. Long-term use can cause thinning of the skin. Continue tacrolimus 0.1% ointment 1-2 times daily to feet and hands Or may restart Eucrisa ointment 1-2 times daily to feet and hands  Upadacitinib ER (RINVOQ) 15 MG TB24 - Right Foot - Posterior Take 1 tablet by mouth daily.  QuantiFERON-TB Gold Plus - Right Foot - Posterior  Post-inflammatory hyperpigmentation B/L abdomen, thighs  Secondary to Dupixent injection site reaction. Resolved with PIH  Color should continue to fade over time.  Recommend daily broad spectrum sunscreen SPF 30+ to sun-exposed areas, reapply every 2 hours as needed. Call for new or changing lesions.  Staying in the shade or wearing long sleeves, sun glasses (UVA+UVB protection) and wide brim hats (4-inch brim around the entire circumference of the hat) are also recommended for sun protection.    Encounter for long-term (current) use of high-risk medication  Related Procedures QuantiFERON-TB Gold Plus   Return in about 1 month (around 06/24/2022) for Eczema recheck, Rinvoq recheck.  I, Emelia Salisbury, CMA, am acting as scribe for Brendolyn Patty, MD.  Documentation: I have reviewed the above documentation for accuracy and completeness, and I agree with the above.  Brendolyn Patty MD

## 2022-05-24 NOTE — Patient Instructions (Addendum)
Rinvoq: take 1 tablet once daily.   Okay to continue the following: Continue clobetasol cream 1-2 times daily to feet and hands as needed. Avoid applying to face, groin, and axilla. Use as directed. Long-term use can cause thinning of the skin. Continue tacrolimus 1-2 times daily to feet and hands May restart Eucrisa 1-2 times daily to feet and hands   Gentle Skin Care Guide  1. Bathe no more than once a day.  2. Avoid bathing in hot water  3. Use a mild soap like Dove, Vanicream, Cetaphil, CeraVe. Can use Lever 2000 or Cetaphil antibacterial soap  4. Use soap only where you need it. On most days, use it under your arms, between your legs, and on your feet. Let the water rinse other areas unless visibly dirty.  5. When you get out of the bath/shower, use a towel to gently blot your skin dry, don't rub it.  6. While your skin is still a little damp, apply a moisturizing cream such as Vanicream, CeraVe, Cetaphil, Eucerin, Sarna lotion or plain Vaseline Jelly. For hands apply Neutrogena [place] Hand Cream or Excipial Hand Cream.  7. Reapply moisturizer any time you start to itch or feel dry.  8. Sometimes using free and clear laundry detergents can be helpful. Fabric softener sheets should be avoided. Downy Free & Gentle liquid, or any liquid fabric softener that is free of dyes and perfumes, it acceptable to use  9. If your doctor has given you prescription creams you may apply moisturizers over them      Due to recent changes in healthcare laws, you may see results of your pathology and/or laboratory studies on MyChart before the doctors have had a chance to review them. We understand that in some cases there may be results that are confusing or concerning to you. Please understand that not all results are received at the same time and often the doctors may need to interpret multiple results in order to provide you with the best plan of care or course of treatment. Therefore, we ask  that you please give [place] 2 business days to thoroughly review all your results before contacting the office for clarification. Should we see a critical lab result, you will be contacted sooner.   If You Need Anything After Your Visit  If you have any questions or concerns for your doctor, please call our main line at [phone] and press option 4 to reach your doctor's medical assistant. If no one answers, please leave a voicemail as directed and we will return your call as soon as possible. Messages left after 4 pm will be answered the following business day.   You may also send [place] a message via [place]. We typically respond to MyChart messages within 1-2 business days.  For prescription refills, please ask your pharmacy to contact our office. Our fax number is [phone].  If you have an urgent issue when the clinic is closed that cannot wait until the next business day, you can page your doctor at the number below.    Please note that while we do our best to be available for urgent issues outside of office hours, we are not available 24/7.   If you have an urgent issue and are unable to reach [place], you may choose to seek medical care at your doctor's office, retail clinic, urgent care center, or emergency room.  If you have a medical emergency, please immediately call 911 or go to the emergency department.  Pager  Numbers  - Dr. Nehemiah Massed: [phone]  - Dr. Laurence Ferrari: [phone]  - Dr. Nicole Kindred: [phone]  In the event of inclement weather, please call our main line at [phone] for an update on the status of any delays or closures.  Dermatology Medication Tips: Please keep the boxes that topical medications come in in order to help keep track of the instructions about where and how to use these. Pharmacies typically print the medication instructions only on the boxes and not directly on the medication tubes.   If your medication is too expensive, please contact our office at  [phone] option 4 or send [place] a message through [place].   We are unable to tell what your co-pay for medications will be in advance as this is different depending on your insurance coverage. However, we may be able to find a substitute medication at lower cost or fill out paperwork to get insurance to cover a needed medication.   If a prior authorization is required to get your medication covered by your insurance company, please allow [place] 1-2 business days to complete this process.  Drug prices often vary depending on where the prescription is filled and some pharmacies may offer cheaper prices.  The website www.goodrx.com contains coupons for medications through different pharmacies. The prices here do not account for what the cost may be with help from insurance (it may be cheaper with your insurance), but the website can give you the price if you did not use any insurance.  - You can print the associated coupon and take it with your prescription to the pharmacy.  - You may also stop by our office during regular business hours and pick up a GoodRx coupon card.  - If you need your prescription sent electronically to a different pharmacy, notify our office through [facility] or by phone at [phone] option 4.     Si Usted Necesita Algo Despus de Su Visita  Tambin puede enviarnos un mensaje a travs de Pharmacist, community. Por lo general respondemos a los mensajes de MyChart en el transcurso de 1 a 2 das hbiles.  Para renovar recetas, por favor pida a su farmacia que se ponga en contacto con nuestra oficina. Harland Dingwall de fax es [place] [phone].  Si tiene un asunto urgente cuando la clnica est cerrada y que no puede esperar hasta el siguiente da hbil, puede llamar/localizar a su doctor(a) al nmero que aparece a continuacin.   Por favor, tenga en cuenta que aunque hacemos todo lo posible para estar disponibles para asuntos urgentes fuera del horario de [place], no estamos  disponibles las 24 horas del da, los 7 das de la [place].   Si tiene un problema urgente y no puede comunicarse con nosotros, puede optar por buscar atencin mdica  en el consultorio de su doctor(a), en una clnica privada, en un centro de atencin urgente o en una sala de emergencias.  Si tiene Engineering geologist, por favor llame inmediatamente al 911 o vaya a la sala de emergencias.  Nmeros de bper  - Dr. Nehemiah Massed: [phone]  - Dra. Moye: [phone]  - Dra. Nicole Kindred: [phone]  En caso de inclemencias del [place], por favor llame a Johnsie Kindred principal al [phone] para una actualizacin sobre el [place] de cualquier retraso o cierre.  Consejos para la medicacin en dermatologa: Por favor, guarde las cajas en las que vienen los medicamentos de uso tpico para ayudarle a seguir las instrucciones sobre dnde y cmo usarlos. [place]  instrucciones del medicamento slo en las cajas y no directamente en los tubos del medicamento.   Si su medicamento es muy caro, por favor, pngase en contacto con Zigmund Daniel llamando al [phone] y presione la opcin 4 o envenos un mensaje a travs de Pharmacist, community.   No podemos decirle cul ser su copago por los medicamentos por adelantado ya que esto es diferente dependiendo de la cobertura de su seguro. Sin embargo, es posible que podamos encontrar un medicamento sustituto a Electrical engineer un formulario para que el seguro cubra el medicamento que se considera necesario.   Si se requiere una autorizacin previa para que su compaa de seguros [place] su medicamento, por favor permtanos de 1 a 2 das hbiles para completar este proceso.  Los precios de los medicamentos varan con frecuencia dependiendo del Environmental consultant de dnde se surte la receta y alguna farmacias pueden ofrecer precios ms baratos.  El sitio web www.goodrx.com tiene cupones para medicamentos de Airline pilot. Los precios aqu no  tienen en cuenta lo que podra costar con la ayuda del seguro (puede ser ms barato con su seguro), pero el sitio web puede darle el precio si no utiliz Research scientist (physical sciences).  - Puede imprimir el cupn correspondiente y llevarlo con su receta a la farmacia.  - Tambin puede pasar por nuestra oficina durante el horario de atencin regular y Charity fundraiser una tarjeta de cupones de GoodRx.  - Si necesita que su receta se enve electrnicamente a una farmacia diferente, informe a nuestra oficina a travs de MyChart de [place] o por telfono llamando al [phone] y presione la opcin 4.

## 2022-05-27 LAB — QUANTIFERON-TB GOLD PLUS
QuantiFERON Mitogen Value: >10 IU/mL
QuantiFERON Nil Value: 0.01 IU/mL
QuantiFERON TB1 Ag Value: 0.02 IU/mL
QuantiFERON TB2 Ag Value: 0.02 IU/mL
QuantiFERON-TB Gold Plus: NEGATIVE

## 2022-05-30 ENCOUNTER — Encounter: Payer: Self-pay | Admitting: Dermatology

## 2022-05-30 ENCOUNTER — Telehealth: Payer: Self-pay

## 2022-05-30 NOTE — Telephone Encounter (Signed)
-----   Message from Alfonso Patten, MD sent at 05/30/2022  1:22 PM EDT ----- Quantiferon gold negative. Ok to start Rinvoq  MAs please call. Thank you!

## 2022-05-30 NOTE — Telephone Encounter (Signed)
Advised pt of lab results.  Advised pt she could start Rinvoq, and she advised she had already started./sh

## 2022-06-05 ENCOUNTER — Ambulatory Visit: Payer: 59 | Admitting: Dermatology

## 2022-06-06 ENCOUNTER — Telehealth: Payer: Self-pay

## 2022-06-06 NOTE — Telephone Encounter (Signed)
Per insurance, patients Rinvoq was denied due to patient must meet the following: Condition must cover at least 10% of the patients BSA.   If not at 10% then please document severity of hands and feet involvement for her appeal.

## 2022-06-19 ENCOUNTER — Telehealth (INDEPENDENT_AMBULATORY_CARE_PROVIDER_SITE_OTHER): Payer: 59 | Admitting: Psychiatry

## 2022-06-19 ENCOUNTER — Encounter: Payer: Self-pay | Admitting: Psychiatry

## 2022-06-19 DIAGNOSIS — F5105 Insomnia due to other mental disorder: Secondary | ICD-10-CM | POA: Diagnosis not present

## 2022-06-19 DIAGNOSIS — F411 Generalized anxiety disorder: Secondary | ICD-10-CM

## 2022-06-19 DIAGNOSIS — F908 Attention-deficit hyperactivity disorder, other type: Secondary | ICD-10-CM | POA: Diagnosis not present

## 2022-06-19 MED ORDER — DULOXETINE HCL 20 MG PO CPEP: 40.0000 mg | ORAL_CAPSULE | Freq: Every day | ORAL | 1 refills | Status: DC

## 2022-06-19 NOTE — Progress Notes (Unsigned)
Virtual Visit via Video Note  I connected with Sara Suarez on 06/19/22 at  1:30 PM EDT by a video enabled telemedicine application and verified that I am speaking with the correct person using two identifiers.  Location Provider Location : [facility] Patient Location : Home  Participants: Patient , Provider   I discussed the limitations of evaluation and management by telemedicine and the availability of in person appointments. The patient expressed understanding and agreed to proceed.   I discussed the assessment and treatment plan with the patient. The patient was provided an opportunity to ask questions and all were answered. The patient agreed with the plan and demonstrated an understanding of the instructions.   The patient was advised to call back or seek an in-person evaluation if the symptoms worsen or if the condition fails to improve as anticipated.    [place] MD OP Progress Note  06/19/2022 1:59 PM Sara Suarez  MRN:  [id number]  Chief Complaint:  Chief Complaint  Patient presents with   Follow-up: 46 year old Caucasian female with history of anxiety, insomnia, ADHD was evaluated today for medication management.   HPI: Sara Suarez is a 47 year old Caucasian female, lives in [place], has a history of anxiety, insomnia, ADHD was evaluated by telemedicine today.  Patient today reports she has been managing her anxiety well.  She does have anxiety about her medical appointments health problems as well as work however she has been coping okay.  Denies any significant depressive symptoms like sadness, anhedonia.  Reports sleep is overall okay.  Does have zolpidem available which he uses as needed.  Patient denies any suicidality, homicidality or perceptual disturbances.  Currently compliant on medications.  Denies side effects.  Recently started on gabapentin for pain.  She reports since being on the gabapentin pain has improved.  Patient wonders if she could use  the gabapentin for mood symptoms control and possibly come off of the Cymbalta.  Patient also with elevated blood pressure reading is interested in medication readjustment.  Patient continues to be on Vyvanse prescribed by her ADHD specialist.  Denies any other concerns today.  Visit Diagnosis:    ICD-10-CM   1. Generalized anxiety disorder  F41.1 DULoxetine (CYMBALTA) 20 MG capsule    2. Insomnia due to mental condition  F51.05    mood    3. Attention deficit hyperactivity disorder (ADHD), other type  F90.8       Past Psychiatric History: Reviewed past psychiatric history from progress note on 07/09/2019.  Past trials of Cymbalta, Klonopin, Ambien.  Past Medical History:  Past Medical History:  Diagnosis Date   ADHD (attention deficit hyperactivity disorder)    Allergy    Anxiety    Depression     Past Surgical History:  Procedure Laterality Date   BREAST CYST ASPIRATION Right    neg   ENDOSCOPIC PLANTAR FASCIOTOMY     FOOT SURGERY     MOUTH SURGERY     TUBAL LIGATION      Family Psychiatric History: Reviewed family psychiatric history from progress note on 07/09/2019.  Family History:  Family History  Problem Relation Age of Onset   Breast cancer Mother 4   Anxiety disorder Mother    Breast cancer Maternal Aunt 41   Anxiety disorder Sister    Panic disorder Sister    Dementia Maternal Grandmother    Dementia Paternal Grandmother     Social History: Reviewed social history from progress note on 07/09/2019. Social History   Socioeconomic  History   Marital status: Married    Spouse name: Sara Suarez   Number of children: 0   Years of education: Not on file   Highest education level: Bachelor's degree (e.g., BA, AB, BS)  Occupational History   Not on file  Tobacco Use   Smoking status: Never   Smokeless tobacco: Never  Vaping Use   Vaping Use: Never used  Substance and Sexual Activity   Alcohol use: Never   Drug use: Never   Sexual activity: Not on file   Other Topics Concern   Not on file  Social History Narrative   Not on file   Social Determinants of Health   Financial Resource Strain: Low Risk  (07/09/2019)   Overall Financial Resource Strain (CARDIA)    Difficulty of Paying Living Expenses: Not hard at all  Food Insecurity: No Food Insecurity (07/09/2019)   Hunger Vital Sign    Worried About Running Out of Food in the Last Year: Never true    [place] in the Last Year: Never true  Transportation Needs: No Transportation Needs (07/09/2019)   PRAPARE - Hydrologist (Medical): No    Lack of Transportation (Non-Medical): No  Physical Activity: Inactive (07/09/2019)   Exercise Vital Sign    Days of Exercise per Week: 0 days    Minutes of Exercise per Session: 0 min  Stress: Stress Concern Present (07/09/2019)   [place]    Feeling of Stress : Rather much  Social Connections: Unknown (07/09/2019)   Social Connection and Isolation Panel [NHANES]    Frequency of Communication with Friends and Family: Not on file    Frequency of Social Gatherings with Friends and Family: Not on file    Attends Religious Services: Never    Active Member of Clubs or Organizations: Yes    Attends Archivist Meetings: More than 4 times per year    Marital Status: Married    Allergies:  Allergies  Allergen Reactions   Atomoxetine Other (See Comments)    Heartburn   Guanfacine Other (See Comments)    drowsiness   Chlorpheniramine-Pseudoeph     Hyperactive   Pseudoephedrine Other (See Comments)    Hyperactive Other reaction(s): insomnia, shakey   Shellfish Allergy Other (See Comments)   Hydrocodone Itching   Prednisone Anxiety    insomnia    Metabolic Disorder Labs: No results found for: "HGBA1C", "MPG" No results found for: "PROLACTIN" No results found for: "CHOL", "TRIG", "HDL", "CHOLHDL", "VLDL", "LDLCALC" No results found for:  "TSH"  Therapeutic Level Labs: No results found for: "LITHIUM" No results found for: "VALPROATE" No results found for: "CBMZ"  Current Medications: Current Outpatient Medications  Medication Sig Dispense Refill   albuterol (VENTOLIN HFA) 108 (90 Base) MCG/ACT inhaler Inhale into the lungs.     azelastine (ASTELIN) 0.1 % nasal spray azelastine 137 mcg (0.1 %) nasal spray aerosol     Cetirizine HCl 10 MG CAPS Take by mouth.     Clindamycin-Benzoyl Per, Refr, (DUAC) gel Apply 1 application topically daily. Qd to face for acne 45 g 4   clobetasol cream (TEMOVATE) 0.05 % Apply to affected areas rash on hands and foot twice daily for flares until improved. Avoid face, groin, underarms. 60 g 1   cyclobenzaprine (FLEXERIL) 5 MG tablet Take by mouth.     Diclofenac Sodium (PENNSAID) 2 % SOLN      DULoxetine (CYMBALTA) 20 MG  capsule Take 2 capsules (40 mg total) by mouth daily. Stop Duloxetine 60 mg 60 capsule 1   DULoxetine (CYMBALTA) 60 MG capsule TAKE 1 CAPSULE (60 MG TOTAL) BY MOUTH DAILY. COMBINE DAILY WITH 20 MG DAILY 90 capsule 1   EPINEPHrine 0.3 mg/0.3 mL IJ SOAJ injection See admin instructions.     gabapentin (NEURONTIN) 300 MG capsule Take by mouth.     hydrOXYzine (VISTARIL) 50 MG capsule TAKE 1 CAPSULE (50 MG TOTAL) BY MOUTH 2 (TWO) TIMES DAILY AS NEEDED. 180 capsule 1   ibuprofen (ADVIL) 800 MG tablet Take 800 mg by mouth 3 (three) times daily.     promethazine (PHENERGAN) 25 MG tablet Take 25 mg by mouth every 6 (six) hours as needed.     spironolactone (ALDACTONE) 50 MG tablet Take 1-2 tablets by mouth daily for acne. 60 tablet 1   SUMAtriptan (IMITREX) 50 MG tablet Take by mouth.     tacrolimus (PROTOPIC) 0.1 % ointment Apply topically as directed. Qd to bid to aa hands, right foot until clear, then prn flares 100 g 2   triamcinolone (NASACORT) 55 MCG/ACT AERO nasal inhaler Nasal Allergy 55 mcg spray aerosol  2 SPRAYS ONCE A DAY NASALLY 30 DAYS     Upadacitinib ER (RINVOQ) 15 MG  TB24 Take 1 tablet by mouth daily. 30 tablet 2   VITAMIN D PO Take by mouth.     VYVANSE 40 MG CHEW Chew 1 tablet by mouth daily.     zolpidem (AMBIEN) 5 MG tablet Take 1-1.5 tablets (5-7.5 mg total) by mouth at bedtime as needed for sleep. 45 tablet 1   ketotifen (ZADITOR) 0.025 % ophthalmic solution  (Patient not taking: Reported on 06/19/2022)     No current facility-administered medications for this visit.     Musculoskeletal: Strength & Muscle Tone:  UTA Gait & Station: normal Patient leans: N/A  Psychiatric Specialty Exam: Review of Systems  Musculoskeletal:  Positive for arthralgias (Chronic) and back pain (chronic).  All other systems reviewed and are negative.   There were no vitals taken for this visit.There is no height or weight on file to calculate BMI.  General Appearance: Casual  Eye Contact:  Fair  Speech:  Clear and Coherent  Volume:  Normal  Mood:  Euthymic  Affect:  Congruent  Thought Process:  Goal Directed and Descriptions of Associations: Intact  Orientation:  Full (Time, Place, and Person)  Thought Content: Logical   Suicidal Thoughts:  No  Homicidal Thoughts:  No  Memory:  Immediate;   Fair Recent;   Fair Remote;   Fair  Judgement:  Fair  Insight:  Fair  Psychomotor Activity:  Normal  Concentration:  Concentration: Fair and Attention Span: Fair  Recall:  [employer] of Knowledge: Fair  Language: Fair  Akathisia:  No  Handed:  Right  AIMS (if indicated): done  Assets:  Communication Skills Desire for [place] Talents/Skills Transportation Vocational/Educational  ADL's:  Intact  Cognition: WNL  Sleep:  Fair   Screenings: GAD-7    Flowsheet Row Video Visit from 06/19/2022 in [facility] Office Visit from 12/29/2021 in [facility]  Total GAD-7 Score 7 11      PHQ2-9    [place] Video Visit from 06/19/2022 in [facility] Office Visit from 12/29/2021 in [facility] Video Visit from 02/01/2021 in [facility]  PHQ-2 Total Score 0 0 0  Flowsheet Row Video Visit from 06/19/2022 in [facility] Office Visit from 12/29/2021 in [facility] from 12/16/2021 in [facility] [place] No Risk No Risk No Risk        Assessment and Plan: Zulma Court is a 46 year old Caucasian female, employed, lives in [place], married, has a history of GAD, insomnia was evaluated by telemedicine today.  Patient is currently improving, however interested in being tapered off of the Cymbalta since she is currently on gabapentin which also helps with her mood symptoms.  Discussed plan as noted below.  Plan GAD-improving Taper off Cymbalta.  Will reduce Cymbalta to 40 mg p.o. daily. Continue CBT with Ms. Derrek Gu Continue gabapentin 300 mg p.o. 3 times daily-prescribed for pain.  Also helps with anxiety.  Insomnia-stable Ambien 7.5 mg p.o. nightly Continue CPAP Reviewed [facility] PMP aware  ADHD-currently followed by Chrys Racer attention specialist.  On Vyvanse 40 mg chewable tablets in divided dosage.  Patient with concerns about elevated blood pressure reading, discussed the effect of Vyvanse as well as Cymbalta on her blood pressure.  Currently being tapered off of the Cymbalta.  Patient advised to follow up with her provider for the same.  Follow-up in clinic in 2 months or sooner if needed.      Collaboration of Care: Collaboration of Care: Referral or follow-up with counselor/therapist AEB encouraged to continue follow-up with therapist.  Patient/Guardian was advised Release of Information must be obtained prior to any record release in order to collaborate their care with an outside provider. Patient/Guardian was advised if they have not already done so to contact  the registration department to sign all necessary forms in order for [place] to release information regarding their care.   Consent: Patient/Guardian gives verbal consent for treatment and assignment of benefits for services provided during this visit. Patient/Guardian expressed understanding and agreed to proceed.   This note was generated in part or whole with voice recognition software. Voice recognition is usually quite accurate but there are transcription errors that can and very often do occur. I apologize for any typographical errors that were not detected and corrected.      Ursula Alert, MD 06/19/2022, 1:59 PM

## 2022-06-22 ENCOUNTER — Encounter: Payer: Self-pay | Admitting: Dermatology

## 2022-06-26 ENCOUNTER — Ambulatory Visit: Payer: 59 | Admitting: Dermatology

## 2022-06-26 DIAGNOSIS — L301 Dyshidrosis [pompholyx]: Secondary | ICD-10-CM | POA: Diagnosis not present

## 2022-06-26 NOTE — Progress Notes (Signed)
   Follow-Up Visit   Subjective  Sara Suarez is a 46 y.o. female who presents for the following: Dyshidrotic dermatitis (Hands, feet, 18mf/u, itching Rinvoq '15mg'$  1 po qd x 28 days then ran out, been off 1 wk,  pt said saw some improvement but not a lot of improvement, not using clobetasol cr or Tacrolimus 0.1% oint ).  Very itchy. Dupixent worked faster but caused severe injection site reactions.   The following portions of the chart were reviewed this encounter and updated as appropriate:       Review of Systems:  No other skin or systemic complaints except as noted in HPI or Assessment and Plan.  Objective  Well appearing patient in no apparent distress; mood and affect are within normal limits.  A focused examination was performed including hands, feet. Relevant physical exam findings are noted in the Assessment and Plan.  hands, feet Dried vesicles L palm, multiple crusted excoriations with hyperkeratosis and erythema bil feet R>L    Assessment & Plan  Dyshidrotic dermatitis hands, feet  Atopic dermatitis - Severe hand/foot involvement, on Rinvoq (biologic medication).   Atopic dermatitis (eczema) is a chronic, relapsing, pruritic condition that can significantly affect quality of life. It is often associated with allergic rhinitis and/or asthma and can require treatment with topical medications, phototherapy, or in severe cases a biologic medication which requires long term medication management.  Discussed Adbry as alternative to Rinvoq if it isn't approved.  Pt would like to continue with Rinvoq and see if covered by insurance.  Restart Rinvoq '15mg'$  1 po qd samples x 2 (28 pills) given Lot [id number] If not improving will consider switching to Adbry, pt unable to use Dupixent due to severe injection site reactions. Restart Clobetasol cr qd/bid up to 5 days a week to aa hands and feet, avoid f/g/a Restart Tacrolimus 0.1% oint qd/bid to aa hands and  feet  Topical steroids (such as triamcinolone, fluocinolone, fluocinonide, mometasone, clobetasol, halobetasol, betamethasone, hydrocortisone) can cause thinning and lightening of the skin if they are used for too long in the same area. Your physician has selected the right strength medicine for your problem and area affected on the body. Please use your medication only as directed by your physician to prevent side effects.      Related Medications Upadacitinib ER (RINVOQ) 15 MG TB24 Take 1 tablet by mouth daily.   Return in about 1 month (around 07/27/2022) for Atopic Derm.  I, SOthelia Pulling RMA, am acting as scribe for TBrendolyn Patty MD .  Documentation: I have reviewed the above documentation for accuracy and completeness, and I agree with the above.  TBrendolyn PattyMD

## 2022-06-26 NOTE — Patient Instructions (Signed)
Due to recent changes in healthcare laws, you may see results of your pathology and/or laboratory studies on MyChart before the doctors have had a chance to review them. We understand that in some cases there may be results that are confusing or concerning to you. Please understand that not all results are received at the same time and often the doctors may need to interpret multiple results in order to provide you with the best plan of care or course of treatment. Therefore, we ask that you please give us 2 business days to thoroughly review all your results before contacting the office for clarification. Should we see a critical lab result, you will be contacted sooner.   If You Need Anything After Your Visit  If you have any questions or concerns for your doctor, please call our main line at [phone] and press option 4 to reach your doctor's medical assistant. If no one answers, please leave a voicemail as directed and we will return your call as soon as possible. Messages left after 4 pm will be answered the following business day.   You may also send us a message via MyChart. We typically respond to MyChart messages within 1-2 business days.  For prescription refills, please ask your pharmacy to contact our office. Our fax number is [phone].  If you have an urgent issue when the clinic is closed that cannot wait until the next business day, you can page your doctor at the number below.    Please note that while we do our best to be available for urgent issues outside of office hours, we are not available 24/7.   If you have an urgent issue and are unable to reach us, you may choose to seek medical care at your doctor's office, retail clinic, urgent care center, or emergency room.  If you have a medical emergency, please immediately call 911 or go to the emergency department.  Pager Numbers  - Dr. Kowalski: [phone]  - Dr. Moye: [phone]  - Dr. Stewart:  [phone]  In the event of inclement weather, please call our main line at [phone] for an update on the status of any delays or closures.  Dermatology Medication Tips: Please keep the boxes that topical medications come in in order to help keep track of the instructions about where and how to use these. Pharmacies typically print the medication instructions only on the boxes and not directly on the medication tubes.   If your medication is too expensive, please contact our office at [phone] option 4 or send us a message through MyChart.   We are unable to tell what your co-pay for medications will be in advance as this is different depending on your insurance coverage. However, we may be able to find a substitute medication at lower cost or fill out paperwork to get insurance to cover a needed medication.   If a prior authorization is required to get your medication covered by your insurance company, please allow us 1-2 business days to complete this process.  Drug prices often vary depending on where the prescription is filled and some pharmacies may offer cheaper prices.  The website www.goodrx.com contains coupons for medications through different pharmacies. The prices here do not account for what the cost may be with help from insurance (it may be cheaper with your insurance), but the website can give you the price if you did not use any insurance.  - You can print the associated coupon and take it with   your prescription to the pharmacy.  - You may also stop by our office during regular business hours and pick up a GoodRx coupon card.  - If you need your prescription sent electronically to a different pharmacy, notify our office through [facility] MyChart or by phone at [phone] option 4.     Si Usted Necesita Algo Despus de Su Visita  Tambin puede enviarnos un mensaje a travs de MyChart. Por lo general respondemos a los mensajes de MyChart en el transcurso de 1 a 2  das hbiles.  Para renovar recetas, por favor pida a su farmacia que se ponga en contacto con nuestra oficina. Nuestro nmero de fax es el [phone].  Si tiene un asunto urgente cuando la clnica est cerrada y que no puede esperar hasta el siguiente da hbil, puede llamar/localizar a su doctor(a) al nmero que aparece a continuacin.   Por favor, tenga en cuenta que aunque hacemos todo lo posible para estar disponibles para asuntos urgentes fuera del horario de oficina, no estamos disponibles las 24 horas del da, los 7 das de la semana.   Si tiene un problema urgente y no puede comunicarse con nosotros, puede optar por buscar atencin mdica  en el consultorio de su doctor(a), en una clnica privada, en un centro de atencin urgente o en una sala de emergencias.  Si tiene una emergencia mdica, por favor llame inmediatamente al 911 o vaya a la sala de emergencias.  Nmeros de bper  - Dr. Kowalski: [phone]  - Dra. Moye: [phone]  - Dra. Stewart: [phone]  En caso de inclemencias del tiempo, por favor llame a nuestra lnea principal al [phone] para una actualizacin sobre el estado de cualquier retraso o cierre.  Consejos para la medicacin en dermatologa: Por favor, guarde las cajas en las que vienen los medicamentos de uso tpico para ayudarle a seguir las instrucciones sobre dnde y cmo usarlos. Las farmacias generalmente imprimen las instrucciones del medicamento slo en las cajas y no directamente en los tubos del medicamento.   Si su medicamento es muy caro, por favor, pngase en contacto con nuestra oficina llamando al [phone] y presione la opcin 4 o envenos un mensaje a travs de MyChart.   No podemos decirle cul ser su copago por los medicamentos por adelantado ya que esto es diferente dependiendo de la cobertura de su seguro. Sin embargo, es posible que podamos encontrar un medicamento sustituto a menor costo o llenar un formulario para que el  seguro cubra el medicamento que se considera necesario.   Si se requiere una autorizacin previa para que su compaa de seguros cubra su medicamento, por favor permtanos de 1 a 2 das hbiles para completar este proceso.  Los precios de los medicamentos varan con frecuencia dependiendo del lugar de dnde se surte la receta y alguna farmacias pueden ofrecer precios ms baratos.  El sitio web www.goodrx.com tiene cupones para medicamentos de diferentes farmacias. Los precios aqu no tienen en cuenta lo que podra costar con la ayuda del seguro (puede ser ms barato con su seguro), pero el sitio web puede darle el precio si no utiliz ningn seguro.  - Puede imprimir el cupn correspondiente y llevarlo con su receta a la farmacia.  - Tambin puede pasar por nuestra oficina durante el horario de atencin regular y recoger una tarjeta de cupones de GoodRx.  - Si necesita que su receta se enve electrnicamente a una farmacia diferente, informe a nuestra oficina a travs de MyChart de [facility]   o por telfono llamando al [phone] y presione la opcin 4.  

## 2022-07-12 ENCOUNTER — Other Ambulatory Visit: Payer: Self-pay | Admitting: Psychiatry

## 2022-07-12 ENCOUNTER — Other Ambulatory Visit: Payer: Self-pay

## 2022-07-12 DIAGNOSIS — F411 Generalized anxiety disorder: Secondary | ICD-10-CM

## 2022-07-12 DIAGNOSIS — L301 Dyshidrosis [pompholyx]: Secondary | ICD-10-CM

## 2022-07-12 MED ORDER — RINVOQ 15 MG PO TB24: 1.0000 | ORAL_TABLET | Freq: Every day | ORAL | 2 refills | Status: DC

## 2022-07-12 NOTE — Progress Notes (Signed)
Fax from [facility] stating that we need to transfer this Rx to [facility]. Escripted

## 2022-07-31 ENCOUNTER — Ambulatory Visit: Payer: 59 | Admitting: Dermatology

## 2022-07-31 DIAGNOSIS — L301 Dyshidrosis [pompholyx]: Secondary | ICD-10-CM | POA: Diagnosis not present

## 2022-07-31 DIAGNOSIS — Z79899 Other long term (current) drug therapy: Secondary | ICD-10-CM

## 2022-07-31 MED ORDER — RINVOQ 30 MG PO TB24: 1.0000 | ORAL_TABLET | Freq: Every day | ORAL | 3 refills | Status: DC

## 2022-07-31 NOTE — Progress Notes (Signed)
Follow-Up Visit   Subjective  Sara Suarez is a 46 y.o. female who presents for the following: Dermatitis (Hands, feet, Rinvoq '15mg'$  1 po qd (has been on ~90 days), clobetasol cr bid, Tacrolimus 0.1% oint bid, Eucrisa oint bid) and check lump (R axilla, pt felt a lump a few days ago but may be gone now).  Not much improvement noted on Rinvoq.   The following portions of the chart were reviewed this encounter and updated as appropriate:       Review of Systems:  No other skin or systemic complaints except as noted in HPI or Assessment and Plan.  Objective  Well appearing patient in no apparent distress; mood and affect are within normal limits.  A focused examination was performed including hands, feet. Relevant physical exam findings are noted in the Assessment and Plan.  hands, feet Erythema with hyperkeratosis dried microvesicles plantar feet R>L, similar on L palm    Assessment & Plan  Dyshidrotic hand dermatitis hands, feet  And foot dermatitis, Atopic - Severe, on Rinvoq (biologic medication). Chronic and persistent condition with duration or expected duration over one year. Condition is symptomatic/ bothersome to patient. Not currently at goal.   Atopic dermatitis (eczema) is a chronic, relapsing, pruritic condition that can significantly affect quality of life. It is often associated with allergic rhinitis and/or asthma and can require treatment with topical medications, phototherapy, or in severe cases a biologic medication called Dupixent, which requires long term medication management.    Check 69molabs today Increase Rinvoq 15 mg to Rinvoq '30mg'$  1 po qd #30 3rf Cont Tacrolimus 0.1% oint qd/bid aa hands, feet until clear, then prn flares Cont Clobetasol cr qd/bid aa hands/feet until clear, then prn flares avoid f/g/a Cont Eucrisa oint qd/bid aa hands/feet until clear, thn prn flares,    Topical steroids (such as triamcinolone, fluocinolone, fluocinonide,  mometasone, clobetasol, halobetasol, betamethasone, hydrocortisone) can cause thinning and lightening of the skin if they are used for too long in the same area. Your physician has selected the right strength medicine for your problem and area affected on the body. Please use your medication only as directed by your physician to prevent side effects.    Discussed potential risks/benefits of JAK inhibitors (Rinvoq/Cibinqo) which are new oral biologic medications used for treating severe, refractory atopic dermatitis.  They are very effective in resolving itchy eczema rashes of the skin.  Overall potential side effects are very minimal and mild and they are much safer than oral steroids. The black box warning for the JAK inhibitor class was discussed, including rare cardiovascular effects, thrombosis, and serious bacterial and viral infections, such as TB or herpes zoster, although these more severe side effects have not been seen with Rinvoq or Cibinqo in their safety trials.  Baseline lab tests are done prior to initiation of medication to ensure pt has no underlying lab abnormalities, and then rechecked at 3 months.  If normal, then periodic doctor visits and annual labs are performed for long term medication management.   Upadacitinib ER (RINVOQ) 30 MG TB24 - hands, feet Take 1 tablet by mouth daily.  Comprehensive metabolic panel - hands, feet  CBC with Differential/Platelet - hands, feet  Lipid Panel - hands, feet   Return in about 2 months (around 09/30/2022) for Atopic Derm.  I, SOthelia Pulling RMA, am acting as scribe for TBrendolyn Patty MD .  Documentation: I have reviewed the above documentation for accuracy and completeness, and I agree with the  above.  Brendolyn Patty MD

## 2022-07-31 NOTE — Patient Instructions (Signed)
Due to recent changes in healthcare laws, you may see results of your pathology and/or laboratory studies on MyChart before the doctors have had a chance to review them. We understand that in some cases there may be results that are confusing or concerning to you. Please understand that not all results are received at the same time and often the doctors may need to interpret multiple results in order to provide you with the best plan of care or course of treatment. Therefore, we ask that you please give us 2 business days to thoroughly review all your results before contacting the office for clarification. Should we see a critical lab result, you will be contacted sooner.   If You Need Anything After Your Visit  If you have any questions or concerns for your doctor, please call our main line at [phone] and press option 4 to reach your doctor's medical assistant. If no one answers, please leave a voicemail as directed and we will return your call as soon as possible. Messages left after 4 pm will be answered the following business day.   You may also send us a message via MyChart. We typically respond to MyChart messages within 1-2 business days.  For prescription refills, please ask your pharmacy to contact our office. Our fax number is [phone].  If you have an urgent issue when the clinic is closed that cannot wait until the next business day, you can page your doctor at the number below.    Please note that while we do our best to be available for urgent issues outside of office hours, we are not available 24/7.   If you have an urgent issue and are unable to reach us, you may choose to seek medical care at your doctor's office, retail clinic, urgent care center, or emergency room.  If you have a medical emergency, please immediately call 911 or go to the emergency department.  Pager Numbers  - Dr. Kowalski: [phone]  - Dr. Moye: [phone]  - Dr. Stewart:  [phone]  In the event of inclement weather, please call our main line at [phone] for an update on the status of any delays or closures.  Dermatology Medication Tips: Please keep the boxes that topical medications come in in order to help keep track of the instructions about where and how to use these. Pharmacies typically print the medication instructions only on the boxes and not directly on the medication tubes.   If your medication is too expensive, please contact our office at [phone] option 4 or send us a message through MyChart.   We are unable to tell what your co-pay for medications will be in advance as this is different depending on your insurance coverage. However, we may be able to find a substitute medication at lower cost or fill out paperwork to get insurance to cover a needed medication.   If a prior authorization is required to get your medication covered by your insurance company, please allow us 1-2 business days to complete this process.  Drug prices often vary depending on where the prescription is filled and some pharmacies may offer cheaper prices.  The website www.goodrx.com contains coupons for medications through different pharmacies. The prices here do not account for what the cost may be with help from insurance (it may be cheaper with your insurance), but the website can give you the price if you did not use any insurance.  - You can print the associated coupon and take it with   your prescription to the pharmacy.  - You may also stop by our office during regular business hours and pick up a GoodRx coupon card.  - If you need your prescription sent electronically to a different pharmacy, notify our office through [facility] MyChart or by phone at [phone] option 4.     Si Usted Necesita Algo Despus de Su Visita  Tambin puede enviarnos un mensaje a travs de MyChart. Por lo general respondemos a los mensajes de MyChart en el transcurso de 1 a 2  das hbiles.  Para renovar recetas, por favor pida a su farmacia que se ponga en contacto con nuestra oficina. Nuestro nmero de fax es el [phone].  Si tiene un asunto urgente cuando la clnica est cerrada y que no puede esperar hasta el siguiente da hbil, puede llamar/localizar a su doctor(a) al nmero que aparece a continuacin.   Por favor, tenga en cuenta que aunque hacemos todo lo posible para estar disponibles para asuntos urgentes fuera del horario de oficina, no estamos disponibles las 24 horas del da, los 7 das de la semana.   Si tiene un problema urgente y no puede comunicarse con nosotros, puede optar por buscar atencin mdica  en el consultorio de su doctor(a), en una clnica privada, en un centro de atencin urgente o en una sala de emergencias.  Si tiene una emergencia mdica, por favor llame inmediatamente al 911 o vaya a la sala de emergencias.  Nmeros de bper  - Dr. Kowalski: [phone]  - Dra. Moye: [phone]  - Dra. Stewart: [phone]  En caso de inclemencias del tiempo, por favor llame a nuestra lnea principal al [phone] para una actualizacin sobre el estado de cualquier retraso o cierre.  Consejos para la medicacin en dermatologa: Por favor, guarde las cajas en las que vienen los medicamentos de uso tpico para ayudarle a seguir las instrucciones sobre dnde y cmo usarlos. Las farmacias generalmente imprimen las instrucciones del medicamento slo en las cajas y no directamente en los tubos del medicamento.   Si su medicamento es muy caro, por favor, pngase en contacto con nuestra oficina llamando al [phone] y presione la opcin 4 o envenos un mensaje a travs de MyChart.   No podemos decirle cul ser su copago por los medicamentos por adelantado ya que esto es diferente dependiendo de la cobertura de su seguro. Sin embargo, es posible que podamos encontrar un medicamento sustituto a menor costo o llenar un formulario para que el  seguro cubra el medicamento que se considera necesario.   Si se requiere una autorizacin previa para que su compaa de seguros cubra su medicamento, por favor permtanos de 1 a 2 das hbiles para completar este proceso.  Los precios de los medicamentos varan con frecuencia dependiendo del lugar de dnde se surte la receta y alguna farmacias pueden ofrecer precios ms baratos.  El sitio web www.goodrx.com tiene cupones para medicamentos de diferentes farmacias. Los precios aqu no tienen en cuenta lo que podra costar con la ayuda del seguro (puede ser ms barato con su seguro), pero el sitio web puede darle el precio si no utiliz ningn seguro.  - Puede imprimir el cupn correspondiente y llevarlo con su receta a la farmacia.  - Tambin puede pasar por nuestra oficina durante el horario de atencin regular y recoger una tarjeta de cupones de GoodRx.  - Si necesita que su receta se enve electrnicamente a una farmacia diferente, informe a nuestra oficina a travs de MyChart de    o por telfono llamando al [phone] y presione la opcin 4.  

## 2022-08-09 ENCOUNTER — Ambulatory Visit
Admission: RE | Admit: 2022-08-09 | Discharge: 2022-08-09 | Disposition: A | Discharge status: Home / Self Care | Payer: 59 | Source: Ambulatory Visit | Attending: Emergency Medicine | Admitting: Emergency Medicine

## 2022-08-09 VITALS — BP 150/86 | HR 98 | Temp 97.6°F | Resp 18 | Ht 64.0 in | Wt 200.0 lb

## 2022-08-09 DIAGNOSIS — J324 Chronic pansinusitis: Secondary | ICD-10-CM | POA: Diagnosis not present

## 2022-08-09 DIAGNOSIS — J209 Acute bronchitis, unspecified: Secondary | ICD-10-CM

## 2022-08-09 MED ORDER — AEROCHAMBER MV MISC: 1 refills | Status: AC

## 2022-08-09 MED ORDER — DOXYCYCLINE HYCLATE 100 MG PO CAPS
100.0000 mg | ORAL_CAPSULE | Freq: Two times a day (BID) | ORAL | 0 refills | Status: AC
Start: 2022-08-09 — End: 2022-08-19

## 2022-08-09 MED ORDER — IBUPROFEN 600 MG PO TABS: 600.0000 mg | ORAL_TABLET | Freq: Four times a day (QID) | ORAL | 0 refills | Status: AC | PRN

## 2022-08-09 MED ORDER — PROMETHAZINE-DM 6.25-15 MG/5ML PO SYRP
5.0000 mL | ORAL_SOLUTION | Freq: Four times a day (QID) | ORAL | 0 refills | Status: DC | PRN
Start: 2022-08-09 — End: 2022-11-29

## 2022-08-09 MED ORDER — ALBUTEROL SULFATE HFA 108 (90 BASE) MCG/ACT IN AERS: 1.0000 | INHALATION_SPRAY | RESPIRATORY_TRACT | 0 refills | Status: AC | PRN

## 2022-08-09 NOTE — ED Triage Notes (Signed)
Patient to Urgent Care with complaints of productive cough over the last 8-9 days. Reports coughing up brown/ green sputum. Has been taking guanfacine/ sudafed/ otc cough meds.   Reports hx of sinus issues and allergies.

## 2022-08-09 NOTE — ED Provider Notes (Signed)
HPI  SUBJECTIVE:  Sara Suarez is a 46 y.o. female who presents with 2 to 3 months of sinus pain and pressure, nasal congestion, clear rhinorrhea, postnasal drip.  She reports a cough starting 8 to 9 days ago that is productive of greenish-yellow mucus.  She reports wheezing, shortness of breath with coughing and talking only.  No dyspnea on exertion.  No facial swelling, upper dental pain.  She states that she had negative home COVID test the other day.  No antibiotics in the past 3 months.  No antipyretic in the past 6 hours.  She originally attributed her sinus symptoms to allergies as she has severe environmental allergies/allergic rhinitis, however, she is getting worse.  She has tried Sudafed, steam, sinus rinses, Tylenol/Sudafed.  Symptoms are better with steam.  No aggravating factors.  She has tried Mucinex DM for the cough, OTC cough drops and OTC cough syrup.  No alleviating factors for the cough.  Her cough is worse with lying down.  She has a past medical history of allergies/allergic rhinitis, prediabetes, and dyshidrotic eczema on Rinvoq.  No history of pulmonary disease, smoking, hypertension.  [facility]: [facility] [place]    Past Medical History:  Diagnosis Date   ADHD (attention deficit hyperactivity disorder)    Allergy    Anxiety    Depression     Past Surgical History:  Procedure Laterality Date   BREAST CYST ASPIRATION Right    neg   ENDOSCOPIC PLANTAR FASCIOTOMY     FOOT SURGERY     MOUTH SURGERY     TUBAL LIGATION      Family History  Problem Relation Age of Onset   Breast cancer Mother 54   Anxiety disorder Mother    Breast cancer Maternal Aunt 76   Anxiety disorder Sister    Panic disorder Sister    Dementia Maternal Grandmother    Dementia Paternal Grandmother     Social History   Tobacco Use   Smoking status: Never   Smokeless tobacco: Never  Vaping Use   Vaping Use: Never used  Substance Use Topics   Alcohol use: Never   Drug use: Never     No current facility-administered medications for this encounter.  Current Outpatient Medications:    albuterol (VENTOLIN HFA) 108 (90 Base) MCG/ACT inhaler, Inhale 1-2 puffs into the lungs every 4 (four) hours as needed for wheezing or shortness of breath., Disp: 1 each, Rfl: 0   doxycycline (VIBRAMYCIN) 100 MG capsule, Take 1 capsule (100 mg total) by mouth 2 (two) times daily for 10 days., Disp: 20 capsule, Rfl: 0   ibuprofen (ADVIL) 600 MG tablet, Take 1 tablet (600 mg total) by mouth every 6 (six) hours as needed., Disp: 30 tablet, Rfl: 0   promethazine-dextromethorphan (PROMETHAZINE-DM) 6.25-15 MG/5ML syrup, Take 5 mLs by mouth 4 (four) times daily as needed for cough., Disp: 118 mL, Rfl: 0   Spacer/Aero-Holding Chambers (AEROCHAMBER MV) inhaler, Use as instructed, Disp: 1 each, Rfl: 1   azelastine (ASTELIN) 0.1 % nasal spray, azelastine 137 mcg (0.1 %) nasal spray aerosol, Disp: , Rfl:    Clindamycin-Benzoyl Per, Refr, (DUAC) gel, Apply 1 application topically daily. Qd to face for acne, Disp: 45 g, Rfl: 4   clobetasol cream (TEMOVATE) 0.05 %, Apply to affected areas rash on hands and foot twice daily for flares until improved. Avoid face, groin, underarms., Disp: 60 g, Rfl: 1   cyclobenzaprine (FLEXERIL) 5 MG tablet, Take by mouth., Disp: , Rfl:  Diclofenac Sodium (PENNSAID) 2 % SOLN, , Disp: , Rfl:    DULoxetine (CYMBALTA) 20 MG capsule, TAKE 2 CAPSULES (40 MG TOTAL) BY MOUTH DAILY. STOP DULOXETINE 60 MG, Disp: 180 capsule, Rfl: 0   DULoxetine (CYMBALTA) 60 MG capsule, TAKE 1 CAPSULE (60 MG TOTAL) BY MOUTH DAILY. COMBINE DAILY WITH 20 MG DAILY, Disp: 90 capsule, Rfl: 1   EPINEPHrine 0.3 mg/0.3 mL IJ SOAJ injection, See admin instructions., Disp: , Rfl:    gabapentin (NEURONTIN) 300 MG capsule, Take by mouth., Disp: , Rfl:    hydrOXYzine (VISTARIL) 50 MG capsule, TAKE 1 CAPSULE (50 MG TOTAL) BY MOUTH 2 (TWO) TIMES DAILY AS NEEDED., Disp: 180 capsule, Rfl: 1   ketotifen (ZADITOR)  0.025 % ophthalmic solution, , Disp: , Rfl:    losartan (COZAAR) 25 MG tablet, Take 25 mg by mouth daily., Disp: , Rfl:    spironolactone (ALDACTONE) 50 MG tablet, Take 1-2 tablets by mouth daily for acne., Disp: 60 tablet, Rfl: 1   SUMAtriptan (IMITREX) 50 MG tablet, Take by mouth., Disp: , Rfl:    tacrolimus (PROTOPIC) 0.1 % ointment, Apply topically as directed. Qd to bid to aa hands, right foot until clear, then prn flares, Disp: 100 g, Rfl: 2   triamcinolone (NASACORT) 55 MCG/ACT AERO nasal inhaler, Nasal Allergy 55 mcg spray aerosol  2 SPRAYS ONCE A DAY NASALLY 30 DAYS, Disp: , Rfl:    Upadacitinib ER (RINVOQ) 30 MG TB24, Take 1 tablet by mouth daily., Disp: 30 tablet, Rfl: 3   VITAMIN D PO, Take by mouth., Disp: , Rfl:    VYVANSE 40 MG CHEW, Chew 1 tablet by mouth daily., Disp: , Rfl:    zolpidem (AMBIEN) 5 MG tablet, Take 1-1.5 tablets (5-7.5 mg total) by mouth at bedtime as needed for sleep., Disp: 45 tablet, Rfl: 1  Allergies  Allergen Reactions   Atomoxetine Other (See Comments)    Heartburn   Guanfacine Other (See Comments)    drowsiness   Chlorpheniramine-Pseudoeph     Hyperactive   Pseudoephedrine Other (See Comments)    Hyperactive Other reaction(s): insomnia, shakey   Shellfish Allergy Other (See Comments)   Hydrocodone Itching   Prednisone Anxiety    insomnia     ROS  As noted in HPI.   Physical Exam  BP (!) 150/86   Pulse 98   Temp 97.6 F (36.4 C)   Resp 18   Ht '5\' 4"'$  (1.626 m)   Wt 90.7 kg   SpO2 98%   BMI 34.33 kg/m   Constitutional: Well developed, well nourished, no acute distress Eyes:  EOMI, conjunctiva normal bilaterally HENT: Normocephalic, atraumatic,mucus membranes moist . erythematous, mildly swollen turbinates.  Positive clear nasal congestion.  No maxillary, frontal sinus tenderness.  No appreciable postnasal drip. Respiratory: Normal inspiratory effort, lungs clear bilaterally, fair air movement.  No chest wall  tenderness Cardiovascular: Normal rate, regular rhythm, no murmurs, rubs, gallops GI: nondistended skin: No rash, skin intact Musculoskeletal: no deformities Neurologic: Alert & oriented x 3, no focal neuro deficits Psychiatric: Speech and behavior appropriate   ED Course   Medications - No data to display  No orders of the defined types were placed in this encounter.   No results found for this or any previous visit (from the past 24 hour(s)). No results found.  ED Clinical Impression  1. Chronic pansinusitis   2. Acute bronchitis, unspecified organism      ED Assessment/Plan     Patient presents with a sinusitis  that has been present over the past 2 to 3 months.  I suspect that she has a bronchitis as well.  Will have her discontinue the Sudafed, Mucinex DM, over-the-counter cough syrup, start her Mucinex D, Promethazine DM, doxycycline for 10 days, which will also cover any possible pneumonia, she is to continue saline nasal irrigation, Tylenol/ibuprofen, regularly scheduled albuterol inhaler with a spacer.  Follow-up with PCP if not better after finishing the doxycycline.  Patient declined a prescription of steroids.  Deferring chest x-ray as I am sending her home on antibiotics regardless of result.  Discussed MDM, treatment plan, and plan for follow-up with patient. patient agrees with plan.   Meds ordered this encounter  Medications   doxycycline (VIBRAMYCIN) 100 MG capsule    Sig: Take 1 capsule (100 mg total) by mouth 2 (two) times daily for 10 days.    Dispense:  20 capsule    Refill:  0   ibuprofen (ADVIL) 600 MG tablet    Sig: Take 1 tablet (600 mg total) by mouth every 6 (six) hours as needed.    Dispense:  30 tablet    Refill:  0   promethazine-dextromethorphan (PROMETHAZINE-DM) 6.25-15 MG/5ML syrup    Sig: Take 5 mLs by mouth 4 (four) times daily as needed for cough.    Dispense:  118 mL    Refill:  0   albuterol (VENTOLIN HFA) 108 (90 Base) MCG/ACT  inhaler    Sig: Inhale 1-2 puffs into the lungs every 4 (four) hours as needed for wheezing or shortness of breath.    Dispense:  1 each    Refill:  0   Spacer/Aero-Holding Chambers (AEROCHAMBER MV) inhaler    Sig: Use as instructed    Dispense:  1 each    Refill:  1      *This clinic note was created using Dragon dictation software. Therefore, there may be occasional mistakes despite careful proofreading.  ?    Melynda Ripple, MD 08/10/22 [phone]

## 2022-08-09 NOTE — Discharge Instructions (Addendum)
2 puffs from your albuterol inhaler using your spacer every 4 hours for 2 days, then every 6 hours for 2 days, then as needed.  You can back off the albuterol if you start to improve sooner.  Stop the other cold medications and over-the-counter cough syrups.  Take Mucinex D, promethazine DM for the cough, finish the doxycycline, even if you feel better.  Continue saline nasal irrigation and Nasacort.  Take 600 mg of ibuprofen, 1000 mg of Tylenol together 3-4 times a day as needed for pain.

## 2022-08-15 ENCOUNTER — Telehealth (INDEPENDENT_AMBULATORY_CARE_PROVIDER_SITE_OTHER): Payer: 59 | Admitting: Psychiatry

## 2022-08-15 ENCOUNTER — Encounter: Payer: Self-pay | Admitting: Psychiatry

## 2022-08-15 DIAGNOSIS — F908 Attention-deficit hyperactivity disorder, other type: Secondary | ICD-10-CM

## 2022-08-15 DIAGNOSIS — F411 Generalized anxiety disorder: Secondary | ICD-10-CM | POA: Diagnosis not present

## 2022-08-15 DIAGNOSIS — F5105 Insomnia due to other mental disorder: Secondary | ICD-10-CM

## 2022-08-15 NOTE — Progress Notes (Signed)
Virtual Visit via Video Note  I connected with Sara Suarez on 08/15/22 at 11:00 AM EDT by a video enabled telemedicine application and verified that I am speaking with the correct person using two identifiers.  Location Provider Location : [facility] Patient Location : Home  Participants: Patient , Provider   I discussed the limitations of evaluation and management by telemedicine and the availability of in person appointments. The patient expressed understanding and agreed to proceed.    I discussed the assessment and treatment plan with the patient. The patient was provided an opportunity to ask questions and all were answered. The patient agreed with the plan and demonstrated an understanding of the instructions.   The patient was advised to call back or seek an in-person evaluation if the symptoms worsen or if the condition fails to improve as anticipated.    Gary MD OP Progress Note  08/15/2022 3:25 PM Sara Suarez  MRN:  [id number]  Chief Complaint:  Chief Complaint  Patient presents with   Follow-up   Anxiety   HPI: Sara Suarez is a 46 year old Caucasian female, lives in [place], has a history of anxiety, insomnia, ADHD was evaluated by telemedicine today.  Patient today reports she is currently doing fairly well on the lower dose of Cymbalta and gabapentin combination.  Denies any significant anxiety or depressive symptoms.  Reports sleep is overall okay.  Uses the Ambien only as needed.  Patient reports recently she became sick with possibly a viral respiratory infection, had to go to the urgent care, currently on a course of doxycycline as well as cough suppressant.  The cough was severe initially however currently getting better.  Reports work continues to be busy.  However able to focus.  The Vyvanse does help.  Currently denies any suicidality, homicidality or perceptual disturbances.  Reports she was recently started on losartan for her blood  pressure, wonders whether there is any drug to drug interaction with her current medications.  Denies any other concerns today.  Visit Diagnosis:    ICD-10-CM   1. 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 Attention deficit hyperactivity disorder (ADHD), other type  F90.8       Past Psychiatric History: Reviewed past psychiatric history from progress note on 07/09/2019.  Past trials of Cymbalta, Klonopin, Ambien.  Past Medical History:  Past Medical History:  Diagnosis Date   ADHD (attention deficit hyperactivity disorder)    Allergy    Anxiety    Depression     Past Surgical History:  Procedure Laterality Date   BREAST CYST ASPIRATION Right    neg   ENDOSCOPIC PLANTAR FASCIOTOMY     FOOT SURGERY     MOUTH SURGERY     TUBAL LIGATION      Family Psychiatric History: Reviewed family psychiatric history from progress note on 07/09/2019.  Family History:  Family History  Problem Relation Age of Onset   Breast cancer Mother 44   Anxiety disorder Mother    Breast cancer Maternal Aunt 60   Anxiety disorder Sister    Panic disorder Sister    Dementia Maternal Grandmother    Dementia Paternal Grandmother     Social History: Reviewed social history from progress note on 07/09/2019. Social History   Socioeconomic History   Marital status: Married    Spouse name: jon   Number of children: 0   Years of  education: Not on file   Highest education level: Bachelor's degree (e.g., BA, AB, BS)  Occupational History   Not on file  Tobacco Use   Smoking status: Never   Smokeless tobacco: Never  Vaping Use   Vaping Use: Never used  Substance and Sexual Activity   Alcohol use: Never   Drug use: Never   Sexual activity: Not on file  Other Topics Concern   Not on file  Social History Narrative   Not on file   Social Determinants of Health   Financial Resource Strain: Low Risk  (07/09/2019)   Overall Financial  Resource Strain (CARDIA)    Difficulty of Paying Living Expenses: Not hard at all  Food Insecurity: No Food Insecurity (07/09/2019)   Hunger Vital Sign    Worried About Running Out of Food in the Last Year: Never true    [place] in the Last Year: Never true  Transportation Needs: No Transportation Needs (07/09/2019)   PRAPARE - Hydrologist (Medical): No    Lack of Transportation (Non-Medical): No  Physical Activity: Inactive (07/09/2019)   Exercise Vital Sign    Days of Exercise per Week: 0 days    Minutes of Exercise per Session: 0 min  Stress: Stress Concern Present (07/09/2019)   [place]    Feeling of Stress : Rather much  Social Connections: Unknown (07/09/2019)   Social Connection and Isolation Panel [NHANES]    Frequency of Communication with Friends and Family: Not on file    Frequency of Social Gatherings with Friends and Family: Not on file    Attends Religious Services: Never    Active Member of Clubs or Organizations: Yes    Attends Archivist Meetings: More than 4 times per year    Marital Status: Married    Allergies:  Allergies  Allergen Reactions   Atomoxetine Other (See Comments)    Heartburn   Guanfacine Other (See Comments)    drowsiness   Chlorpheniramine-Pseudoeph     Hyperactive   Dupilumab Hives   Pseudoephedrine Other (See Comments)    Hyperactive Other reaction(s): insomnia, shakey   Shellfish Allergy Other (See Comments)   Hydrocodone Itching   Prednisone Anxiety    insomnia    Metabolic Disorder Labs: No results found for: "HGBA1C", "MPG" No results found for: "PROLACTIN" No results found for: "CHOL", "TRIG", "HDL", "CHOLHDL", "VLDL", "LDLCALC" No results found for: "TSH"  Therapeutic Level Labs: No results found for: "LITHIUM" No results found for: "VALPROATE" No results found for: "CBMZ"  Current Medications: Current  Outpatient Medications  Medication Sig Dispense Refill   albuterol (VENTOLIN HFA) 108 (90 Base) MCG/ACT inhaler Inhale 1-2 puffs into the lungs every 4 (four) hours as needed for wheezing or shortness of breath. 1 each 0   azelastine (ASTELIN) 0.1 % nasal spray azelastine 137 mcg (0.1 %) nasal spray aerosol     Clindamycin-Benzoyl Per, Refr, (DUAC) gel Apply 1 application topically daily. Qd to face for acne 45 g 4   clobetasol cream (TEMOVATE) 0.05 % Apply to affected areas rash on hands and foot twice daily for flares until improved. Avoid face, groin, underarms. 60 g 1   Crisaborole (EUCRISA) 2 % OINT APPLY TO AFFECTED AREAS HANDS AND FEET 1-2 TIMES A DAY FOR MAINTENANCE.     Diclofenac Sodium (PENNSAID) 2 % SOLN      doxycycline (VIBRAMYCIN) 100 MG capsule Take 1 capsule (100  mg total) by mouth 2 (two) times daily for 10 days. 20 capsule 0   DULoxetine (CYMBALTA) 20 MG capsule TAKE 2 CAPSULES (40 MG TOTAL) BY MOUTH DAILY. STOP DULOXETINE 60 MG 180 capsule 0   EPINEPHrine 0.3 mg/0.3 mL IJ SOAJ injection See admin instructions.     gabapentin (NEURONTIN) 300 MG capsule Take by mouth.     hydrOXYzine (VISTARIL) 50 MG capsule TAKE 1 CAPSULE (50 MG TOTAL) BY MOUTH 2 (TWO) TIMES DAILY AS NEEDED. 180 capsule 1   ibuprofen (ADVIL) 600 MG tablet Take 1 tablet (600 mg total) by mouth every 6 (six) hours as needed. 30 tablet 0   losartan (COZAAR) 25 MG tablet Take 1 tablet by mouth daily.     Na Sulfate-K Sulfate-Mg Sulf 17.5-3.13-1.6 GM/177ML SOLN Take 1 Bottle by mouth as directed.     promethazine-dextromethorphan (PROMETHAZINE-DM) 6.25-15 MG/5ML syrup Take 5 mLs by mouth 4 (four) times daily as needed for cough. 118 mL 0   Spacer/Aero-Holding Chambers (AEROCHAMBER MV) inhaler Use as instructed 1 each 1   SUMAtriptan (IMITREX) 50 MG tablet Take by mouth.     tacrolimus (PROTOPIC) 0.1 % ointment Apply topically as directed. Qd to bid to aa hands, right foot until clear, then prn flares 100 g 2    triamcinolone (NASACORT) 55 MCG/ACT AERO nasal inhaler Nasal Allergy 55 mcg spray aerosol  2 SPRAYS ONCE A DAY NASALLY 30 DAYS     Upadacitinib ER (RINVOQ) 30 MG TB24 Take 1 tablet by mouth daily. 30 tablet 3   VITAMIN D PO Take by mouth.     VYVANSE 40 MG CHEW Chew 1 tablet by mouth daily.     zolpidem (AMBIEN) 5 MG tablet Take 1-1.5 tablets (5-7.5 mg total) by mouth at bedtime as needed for sleep. 45 tablet 1   cyclobenzaprine (FLEXERIL) 5 MG tablet Take by mouth. (Patient not taking: Reported on 08/15/2022)     No current facility-administered medications for this visit.     Musculoskeletal: Strength & Muscle Tone:  UTA Gait & Station:  Seated Patient leans: N/A  Psychiatric Specialty Exam: Review of Systems  Respiratory:  Positive for cough.   Psychiatric/Behavioral: Negative.    All other systems reviewed and are negative.   There were no vitals taken for this visit.There is no height or weight on file to calculate BMI.  General Appearance: Casual  Eye Contact:  Fair  Speech:  Clear and Coherent  Volume:  Normal  Mood:  Euthymic  Affect:  Congruent  Thought Process:  Goal Directed and Descriptions of Associations: Intact  Orientation:  Full (Time, Place, and Person)  Thought Content: Logical   Suicidal Thoughts:  No  Homicidal Thoughts:  No  Memory:  Immediate;   Fair Recent;   Fair Remote;   Fair  Judgement:  Fair  Insight:  Fair  Psychomotor Activity:  Normal  Concentration:  Concentration: Fair and Attention Span: Fair  Recall:  [employer] of Knowledge: Fair  Language: Fair  Akathisia:  No  Handed:  Right  AIMS (if indicated): not done  Assets:  Communication Skills Desire for [place] Talents/Skills Transportation  ADL's:  Intact  Cognition: WNL  Sleep:  Fair   Screenings: AIMS    Flowsheet Row Video Visit from 06/19/2022 in [facility] Total Score 0      GAD-7    Flowsheet Row Video  Visit from 06/19/2022 in [facility] Office Visit from 12/29/2021  in [facility]  Total GAD-7 Score 7 11      PHQ2-9    Flowsheet Row Video Visit from 08/15/2022 in [facility] Video Visit from 06/19/2022 in [facility] Office Visit from 12/29/2021 in [facility] Video Visit from 02/01/2021 in [facility]  PHQ-2 Total Score 0 0 0 0      Flowsheet Row Video Visit from 08/15/2022 in [facility] from 08/09/2022 in [facility]  Video Visit from 06/19/2022 in [facility] No Risk No Risk No Risk        Assessment and Plan: Sara Suarez is a 46 year old Caucasian female, employed, lives in [place], married, has a history of GAD, insomnia was evaluated by telemedicine today.  Patient is currently stable.  Plan as noted below.  Plan GAD-stable Cymbalta 40 mg p.o. daily-reduced dosage. Continue gabapentin 300 mg p.o. 3 times daily-prescribed for pain Continue CBT with Ms. Nathaniel Man.  Insomnia-stable Ambien 7.5 mg p.o. nightly Continue CPAP Reviewed [place] PMP awarxe  ADHD-currently being followed by [place] attention specialist.  Vyvanse 40 mg chewable tablets in divided dosage.  Discussed drug to drug interaction between duloxetine and losartan, patient to watch out for orthostatic hypotension, syncope.  Patient advised to log her blood pressure and follow up with primary care provider.  Follow-up in clinic in 3 months or sooner if needed.  Collaboration of Care: Collaboration of Care: Other encouraged to follow up with primary care provider as needed.  Patient/Guardian was advised Release of Information must be obtained prior to any record release in order to collaborate their care with an outside provider.  Patient/Guardian was advised if they have not already done so to contact the registration department to sign all necessary forms in order for [place] to release information regarding their care.   Consent: Patient/Guardian gives verbal consent for treatment and assignment of benefits for services provided during this visit. Patient/Guardian expressed understanding and agreed to proceed.   This note was generated in part or whole with voice recognition software. Voice recognition is usually quite accurate but there are transcription errors that can and very often do occur. I apologize for any typographical errors that were not detected and corrected.      Ursula Alert, MD 08/15/2022, 3:25 PM

## 2022-10-02 ENCOUNTER — Ambulatory Visit: Payer: 59 | Admitting: Dermatology

## 2022-10-02 DIAGNOSIS — Z79899 Other long term (current) drug therapy: Secondary | ICD-10-CM | POA: Diagnosis not present

## 2022-10-02 DIAGNOSIS — L2089 Other atopic dermatitis: Secondary | ICD-10-CM | POA: Diagnosis not present

## 2022-10-02 DIAGNOSIS — L700 Acne vulgaris: Secondary | ICD-10-CM | POA: Diagnosis not present

## 2022-10-02 DIAGNOSIS — L301 Dyshidrosis [pompholyx]: Secondary | ICD-10-CM

## 2022-10-02 MED ORDER — EUCRISA 2 % EX OINT: TOPICAL_OINTMENT | CUTANEOUS | 2 refills | Status: AC

## 2022-10-02 MED ORDER — TACROLIMUS 0.1 % EX OINT: TOPICAL_OINTMENT | CUTANEOUS | 2 refills | Status: AC

## 2022-10-02 MED ORDER — SPIRONOLACTONE 50 MG PO TABS: ORAL_TABLET | ORAL | 2 refills | Status: DC

## 2022-10-02 MED ORDER — ADBRY 150 MG/ML ~~LOC~~ SOSY: 600.0000 mg | PREFILLED_SYRINGE | Freq: Once | SUBCUTANEOUS | 0 refills | Status: AC

## 2022-10-02 MED ORDER — CLOBETASOL PROPIONATE 0.05 % EX CREA: TOPICAL_CREAM | CUTANEOUS | 1 refills | Status: AC

## 2022-10-02 MED ORDER — MUPIROCIN 2 % EX OINT: 1.0000 | TOPICAL_OINTMENT | Freq: Every day | CUTANEOUS | 0 refills | Status: AC

## 2022-10-02 MED ORDER — ADBRY 150 MG/ML ~~LOC~~ SOSY: 300.0000 mg | PREFILLED_SYRINGE | SUBCUTANEOUS | 5 refills | Status: DC

## 2022-10-02 NOTE — Progress Notes (Signed)
Follow-Up Visit   Subjective  Sara Suarez is a 46 y.o. female who presents for the following: Eczema (Patient here today for worsening blisters at the feet. Patient is on Rinvoq 30 mg daily. She is using clobetasol, Eucrisa and tacrolimus. ). Rash is very itchy.  She pops the blisters and puts Neosporin on them. Patient has been on Dupixent with improvement but had severe injection site reactions. She also has cystic acne and wishes to restart Spironolactone.  The following portions of the chart were reviewed this encounter and updated as appropriate:       Review of Systems:  No other skin or systemic complaints except as noted in HPI or Assessment and Plan.  Objective  Well appearing patient in no apparent distress; mood and affect are within normal limits.  A focused examination was performed including feet, hands. Relevant physical exam findings are noted in the Assessment and Plan.  feet, hands Multiple erythematous scaly patches with numerous crusted excoriations at feet, webspaces Deep microvesicles at palms, pink scaly patch at left 5th webspace hand  face Cystic inflammatory papules at right mandible    Assessment & Plan  Dyshidrotic foot dermatitis feet, hands  Chronic and persistent condition with duration or expected duration over one year. Condition is bothersome/symptomatic for patient. Currently flared.   Atopic dermatitis - Severe, on Dupixent or Adbry (biologic medications) .  Atopic dermatitis (eczema) is a chronic, relapsing, pruritic condition that can significantly affect quality of life. It is often associated with allergic rhinitis and/or asthma and can require treatment with topical medications, phototherapy, or in severe cases a biologic medication called Dupixent or Adbry, which requires long term medication management.    Discontinue Rinvoq, not responding to higher dose 30 mg qd Start Adbry '150mg'$ /mL.  Starting dose of '600mg'$ /mL injected  today by patient to right thigh x 2 ('300mg'$ /mL), left abdomen x 2 ('300mg'$ /mL). Patient tolerated well.  Lot # [id number]  Exp: 07/2023  D/c neosporin Start mupirocin daily to open areas and cover with band aid.  Continue clobetasol cream twice daily as needed for flares. Avoid applying to face, groin, and axilla. Use as directed. Long-term use can cause thinning of the skin. Continue Eucrisa 1-2 times daily as needed. Continue tacrolimus 1-2 times daily as needed.  Topical steroids (such as triamcinolone, fluocinolone, fluocinonide, mometasone, clobetasol, halobetasol, betamethasone, hydrocortisone) can cause thinning and lightening of the skin if they are used for too long in the same area. Your physician has selected the right strength medicine for your problem and area affected on the body. Please use your medication only as directed by your physician to prevent side effects.   Tralokinumab [place]ane) is a treatment given by injection for adults with moderate-to-severe atopic dermatitis. Goal is control of skin condition, not cure. It is given as 4 injections at the first dose followed by 2 injections ever 2 weeks thereafter.  Potential side effects include allergic reaction, injection site reactions and conjunctivitis (inflammation of the eyes).  The use of Adbry requires long term medication management, including periodic office visits.   Tralokinumab-ldrm (ADBRY) 150 MG/ML SOSY - feet, hands Inject 2 mLs (300 mg total) into the skin every 14 (fourteen) days. Starting on day 15 for maintenance.  mupirocin ointment (BACTROBAN) 2 % - feet, hands Apply 1 Application topically daily.  Dyshidrotic eczema  Related Medications clobetasol cream (TEMOVATE) 0.05 % Apply to affected areas rash on hands and foot twice daily for flares until improved. Avoid face, groin, underarms.  Tralokinumab-ldrm (ADBRY) 150 MG/ML SOSY Inject 4 mLs (600 mg total) into the skin once for 1 dose. On day 1.  Other  atopic dermatitis  Related Medications tacrolimus (PROTOPIC) 0.1 % ointment Apply topically as directed. Qd to bid to aa hands, right foot until clear, then prn flares  Acne vulgaris face  Chronic and persistent condition with duration or expected duration over one year. Condition is symptomatic / bothersome to patient. Not to goal.  restart spironolactone 50 mg PO 1-2 times daily.   Spironolactone can cause increased urination and cause blood pressure to decrease. Please watch for signs of lightheadedness and be cautious when changing position. It can sometimes cause breast tenderness or an irregular period in premenopausal women. It can also increase potassium. The increase in potassium usually is not a concern unless you are taking other medicines that also increase potassium, so please be sure your doctor knows all of the other medications you are taking. This medication should not be taken by pregnant women.  This medicine should also not be taken together with sulfa drugs like Bactrim (trimethoprim/sulfamethexazole).    Related Medications spironolactone (ALDACTONE) 50 MG tablet Take 1-2 tablets by mouth every day for acne.   Return in about 2 weeks (around 10/16/2022) for Adbry injections with nurse, 6 weeks with Dr. Nicole Kindred.  [name], RMA, am acting as scribe for Brendolyn Patty, MD .  Documentation: I have reviewed the above documentation for accuracy and completeness, and I agree with the above.  Brendolyn Patty MD

## 2022-10-02 NOTE — Patient Instructions (Addendum)
Start mupirocin daily to open areas and cover with band aid.  Continue clobetasol cream twice daily as needed for flares. Avoid applying to face, groin, and axilla. Use as directed. Long-term use can cause thinning of the skin. Continue Eucrisa 1-2 times daily as needed. Continue tacrolimus 1-2 times daily as needed.  Topical steroids (such as triamcinolone, fluocinolone, fluocinonide, mometasone, clobetasol, halobetasol, betamethasone, hydrocortisone) can cause thinning and lightening of the skin if they are used for too long in the same area. Your physician has selected the right strength medicine for your problem and area affected on the body. Please use your medication only as directed by your physician to prevent side effects.   Spironolactone can cause increased urination and cause blood pressure to decrease. Please watch for signs of lightheadedness and be cautious when changing position. It can sometimes cause breast tenderness or an irregular period in premenopausal women. It can also increase potassium. The increase in potassium usually is not a concern unless you are taking other medicines that also increase potassium, so please be sure your doctor knows all of the other medications you are taking. This medication should not be taken by pregnant women.  This medicine should also not be taken together with sulfa drugs like Bactrim (trimethoprim/sulfamethexazole).   Due to recent changes in healthcare laws, you may see results of your pathology and/or laboratory studies on MyChart before the doctors have had a chance to review them. We understand that in some cases there may be results that are confusing or concerning to you. Please understand that not all results are received at the same time and often the doctors may need to interpret multiple results in order to provide you with the best plan of care or course of treatment. Therefore, we ask that you please give [place] 2 business days to thoroughly  review all your results before contacting the office for clarification. Should we see a critical lab result, you will be contacted sooner.   If You Need Anything After Your Visit  If you have any questions or concerns for your doctor, please call our main line at [phone] and press option 4 to reach your doctor's medical assistant. If no one answers, please leave a voicemail as directed and we will return your call as soon as possible. Messages left after 4 pm will be answered the following business day.   You may also send [place] a message via [place]. We typically respond to MyChart messages within 1-2 business days.  For prescription refills, please ask your pharmacy to contact our office. Our fax number is [phone].  If you have an urgent issue when the clinic is closed that cannot wait until the next business day, you can page your doctor at the number below.    Please note that while we do our best to be available for urgent issues outside of office hours, we are not available 24/7.   If you have an urgent issue and are unable to reach [place], you may choose to seek medical care at your doctor's office, retail clinic, urgent care center, or emergency room.  If you have a medical emergency, please immediately call 911 or go to the emergency department.  Pager Numbers  - Dr. Nehemiah Massed: [phone]  - Dr. Laurence Ferrari: [phone]  - Dr. Nicole Kindred: [phone]  In the event of inclement weather, please call our main line at [phone] for an update on the status of any delays or closures.  Dermatology Medication Tips: Please keep the  boxes that topical medications come in in order to help keep track of the instructions about where and how to use these. Pharmacies typically print the medication instructions only on the boxes and not directly on the medication tubes.   If your medication is too expensive, please contact our office at [phone] option 4 or send [place] a message through  [place].   We are unable to tell what your co-pay for medications will be in advance as this is different depending on your insurance coverage. However, we may be able to find a substitute medication at lower cost or fill out paperwork to get insurance to cover a needed medication.   If a prior authorization is required to get your medication covered by your insurance company, please allow [place] 1-2 business days to complete this process.  Drug prices often vary depending on where the prescription is filled and some pharmacies may offer cheaper prices.  The website www.goodrx.com contains coupons for medications through different pharmacies. The prices here do not account for what the cost may be with help from insurance (it may be cheaper with your insurance), but the website can give you the price if you did not use any insurance.  - You can print the associated coupon and take it with your prescription to the pharmacy.  - You may also stop by our office during regular business hours and pick up a GoodRx coupon card.  - If you need your prescription sent electronically to a different pharmacy, notify our office through [facility] or by phone at [phone] option 4.     Si Usted Necesita Algo Despus de Su Visita  Tambin puede enviarnos un mensaje a travs de Pharmacist, community. Por lo general respondemos a los mensajes de MyChart en el transcurso de 1 a 2 das hbiles.  Para renovar recetas, por favor pida a su farmacia que se ponga en contacto con nuestra oficina. Harland Dingwall de fax es [place] [phone].  Si tiene un asunto urgente cuando la clnica est cerrada y que no puede esperar hasta el siguiente da hbil, puede llamar/localizar a su doctor(a) al nmero que aparece a continuacin.   Por favor, tenga en cuenta que aunque hacemos todo lo posible para estar disponibles para asuntos urgentes fuera del horario de [place], no estamos disponibles las 24 horas del da, los 7 das de la  [place].   Si tiene un problema urgente y no puede comunicarse con nosotros, puede optar por buscar atencin mdica  en el consultorio de su doctor(a), en una clnica privada, en un centro de atencin urgente o en una sala de emergencias.  Si tiene Engineering geologist, por favor llame inmediatamente al 911 o vaya a la sala de emergencias.  Nmeros de bper  - Dr. Nehemiah Massed: [phone]  - Dra. Moye: [phone]  - Dra. Nicole Kindred: [phone]  En caso de inclemencias del [place], por favor llame a Johnsie Kindred principal al [phone] para una actualizacin sobre el [place] de cualquier retraso o cierre.  Consejos para la medicacin en dermatologa: Por favor, guarde las cajas en las que vienen los medicamentos de uso tpico para ayudarle a seguir las instrucciones sobre dnde y cmo usarlos. Las farmacias generalmente imprimen las instrucciones del medicamento slo en las cajas y no directamente en los tubos del [place].   Si su medicamento es muy caro, por favor, pngase en contacto con Zigmund Daniel llamando al [phone] y presione la opcin 4 o envenos un mensaje a travs de Pharmacist, community.  No podemos decirle cul ser su copago por los medicamentos por adelantado ya que esto es diferente dependiendo de la cobertura de su seguro. Sin embargo, es posible que podamos encontrar un medicamento sustituto a Electrical engineer un formulario para que el seguro cubra el medicamento que se considera necesario.   Si se requiere una autorizacin previa para que su compaa de seguros [place] su medicamento, por favor permtanos de 1 a 2 das hbiles para completar este proceso.  Los precios de los medicamentos varan con frecuencia dependiendo del Environmental consultant de dnde se surte la receta y alguna farmacias pueden ofrecer precios ms baratos.  El sitio web www.goodrx.com tiene cupones para medicamentos de Airline pilot. Los precios aqu no tienen en cuenta lo que podra costar con la ayuda del  seguro (puede ser ms barato con su seguro), pero el sitio web puede darle el precio si no utiliz Research scientist (physical sciences).  - Puede imprimir el cupn correspondiente y llevarlo con su receta a la farmacia.  - Tambin puede pasar por nuestra oficina durante el horario de atencin regular y Charity fundraiser una tarjeta de cupones de GoodRx.  - Si necesita que su receta se enve electrnicamente a una farmacia diferente, informe a nuestra oficina a travs de MyChart de [place] o por telfono llamando al [phone] y presione la opcin 4.

## 2022-10-11 ENCOUNTER — Ambulatory Visit: Payer: 59 | Admitting: Dermatology

## 2022-10-11 ENCOUNTER — Encounter: Payer: Self-pay | Admitting: Dermatology

## 2022-10-11 ENCOUNTER — Other Ambulatory Visit: Payer: Self-pay | Admitting: Psychiatry

## 2022-10-11 VITALS — BP 114/77 | HR 101

## 2022-10-11 DIAGNOSIS — R21 Rash and other nonspecific skin eruption: Secondary | ICD-10-CM | POA: Diagnosis not present

## 2022-10-11 DIAGNOSIS — L301 Dyshidrosis [pompholyx]: Secondary | ICD-10-CM | POA: Diagnosis not present

## 2022-10-11 DIAGNOSIS — F411 Generalized anxiety disorder: Secondary | ICD-10-CM

## 2022-10-11 MED ORDER — PREDNISONE 10 MG PO TABS: ORAL_TABLET | ORAL | 0 refills | Status: DC

## 2022-10-11 MED ORDER — CLOBETASOL PROPIONATE 0.05 % EX SOLN: CUTANEOUS | 2 refills | Status: AC

## 2022-10-11 NOTE — Progress Notes (Signed)
Follow-Up Visit   Subjective  Sara Suarez is a 46 y.o. female who presents for the following: Rash (Patient C/O rash and itching all over, also has injection site reactions on abdomen. Was given loading dose of Adbry 10/02/2022. This started a couple of days after loading dose was given. Started Adbry for dyshidrotic dermatitis on hands/feet. Had injection site reactions while on Dupixent. Started Losartan ~3-4 months ago). She also restarted Spironolactone at same time as Adbry.    The following portions of the chart were reviewed this encounter and updated as appropriate:      Review of Systems: No other skin or systemic complaints except as noted in HPI or Assessment and Plan.   Objective  Well appearing patient in no apparent distress; mood and affect are within normal limits.  A focused examination was performed including face, torso, arms. Relevant physical exam findings are noted in the Assessment and Plan.  torso, arms, legs Multiple small pink papules with excoriations at arms, chest, back, hips  Eczematous reaction to adhesive at injection sites           palms, soles Scaly erythematous papules and patches +/- dyspigmentation, lichenification, excoriations.                     Assessment & Plan  Rash and other nonspecific skin eruption torso, arms, legs  Possible generalized allergic drug reaction, Adbry vrs Spironolactone, since both were started at same time, doubt injection site reaction- appears as contact dermatitis to adhesive in bandaid.  Stop Adbry injections. Will check CBC/diff (check for eosinophilia- potential s.e of Adbry), ESR  Stop Spironolactone. Once rash has resolved restart Spironolactone. Watch for recurrence of rash.  Start Prednisone 18m take 4 for 2 days, take 3 for 1 day, take 2 for 1 day, take 1 for 1 day. Take first thing in morning with breakfast. #14 tablets, 0 Rf  Risks of prednisone taper discussed  including mood irritability, insomnia, weight gain, stomach ulcers, increased risk of infection, increased blood sugar (diabetes), hypertension, osteoporosis with long-term or frequent use, and rare risk of avascular necrosis of the hip.    Clobetasol solution as follows:  Buy TWO 16oz jars of CeraVe moisturizing cream  [facility], [facility], [facility] (no prescription needed)  Costs about $15 per jar   Jar #1: Use as a moisturizer as needed. Can be applied to any area of the body. Use twice daily to unaffected areas.  Jar #2: Pour one 529mbottle of clobetasol 0.05% solution into jar, mix well. Label this jar to indicate the medication has been added. Use twice daily to affected areas. Do not apply to face, groin or underarms.  Moisturizer may burn or sting initially. Try for at least 4 weeks.   Start Hydroxyzine 50 mg PO bid prn itch- pt has    clobetasol (TEMOVATE) 0.05 % external solution - torso, arms, legs Mix with CeraVe cream and use as directed. Avoid applying to face, groin, and axilla.  predniSONE (DELTASONE) 10 MG tablet - torso, arms, legs take 4 for 2 days, take 3 for 1 day, take 2 for 1 day, take 1 for 1 day. Take first thing in morning with breakfast.  CBC with Differential/Platelet - torso, arms, legs  Sedimentation Rate - torso, arms, legs  Dyshidrotic dermatitis palms, soles  Chronic and persistent condition with duration or expected duration over one year. Condition is bothersome/symptomatic for patient. Currently flared.   Has not responded to 30 mg Rinvoq  and multiple topicals.  She also had adverse reaction to Dupixent.  Continue Clobetasol cream twice daily as directed  Consider patch testing once generalized rash has cleared and off of Prednisone  Consider starting Otezla, possible palmar plantar psoriasis?   Return for Follow Up As Scheduled.  I, Emelia Salisbury, CMA, am acting as scribe for Brendolyn Patty, MD.  Documentation: I have reviewed the above  documentation for accuracy and completeness, and I agree with the above.  Brendolyn Patty MD

## 2022-10-11 NOTE — Patient Instructions (Addendum)
Avoid Adbry injections  Stop Spironolactone. Once rash has resolved restart Spironolactone. Watch for recurrence of rash.  Start Prednisone '10mg'$  take 4 for 2 days, take 3 for 1 day, take 2 for 1 day, take 1 for 1 day. Take first thing in morning with breakfast. #14 tablets, 0 Rf   Clobetasol solution as follows:  Buy TWO 16oz jars of CeraVe moisturizing cream  [facility], [facility], [facility] (no prescription needed)  Costs about $15 per jar   Jar #1: Use as a moisturizer as needed. Can be applied to any area of the body. Use twice daily to unaffected areas.  Jar #2: Pour one 45m bottle of clobetasol 0.05% solution into jar, mix well. Label this jar to indicate the medication has been added. Use twice daily to affected areas. Do not apply to face, groin or underarms.  Moisturizer may burn or sting initially. Try for at least 4 weeks.   Risks of prednisone taper discussed including mood irritability, insomnia, weight gain, stomach ulcers, increased risk of infection, increased blood sugar (diabetes), hypertension, osteoporosis with long-term or frequent use, and rare risk of avascular necrosis of the hip.   Topical steroids (such as triamcinolone, fluocinolone, fluocinonide, mometasone, clobetasol, halobetasol, betamethasone, hydrocortisone) can cause thinning and lightening of the skin if they are used for too long in the same area. Your physician has selected the right strength medicine for your problem and area affected on the body. Please use your medication only as directed by your physician to prevent side effects.    Due to recent changes in healthcare laws, you may see results of your pathology and/or laboratory studies on MyChart before the doctors have had a chance to review them. We understand that in some cases there may be results that are confusing or concerning to you. Please understand that not all results are received at the same time and often the doctors may need to interpret multiple  results in order to provide you with the best plan of care or course of treatment. Therefore, we ask that you please give u[place]2 business days to thoroughly review all your results before contacting the office for clarification. Should we see a critical lab result, you will be contacted sooner.   If You Need Anything After Your Visit  If you have any questions or concerns for your doctor, please call our main line at [phone] press option 4 to reach your doctor's medical assistant. If no one answers, please leave a voicemail as directed and we will return your call as soon as possible. Messages left after 4 pm will be answered the following business day.   You may also send [place] message via [facility] [facility] We typically respond to MyChart messages within 1-2 business days.  For prescription refills, please ask your pharmacy to contact our office. Our fax number is [phone]  If you have an urgent issue when the clinic is closed that cannot wait until the next business day, you can page your doctor at the number below.    Please note that while we do our best to be available for urgent issues outside of office hours, we are not available 24/7.   If you have an urgent issue and are unable to reach [place] you may choose to seek medical care at your doctor's office, retail clinic, urgent care center, or emergency room.  If you have a medical emergency, please immediately call 911 or go to the emergency department.  Pager Numbers  - Dr.  Nehemiah Massed: [phone]  - Dr. Laurence Ferrari: [phone]  - Dr. Nicole Kindred: [phone]  In the event of inclement weather, please call our main line at [phone] for an update on the status of any delays or closures.  Dermatology Medication Tips: Please keep the boxes that topical medications come in in order to help keep track of the instructions about where and how to use these. Pharmacies typically print the medication instructions only on the boxes and not  directly on the medication tubes.   If your medication is too expensive, please contact our office at [phone] option 4 or send [place] a message through [place].   We are unable to tell what your co-pay for medications will be in advance as this is different depending on your insurance coverage. However, we may be able to find a substitute medication at lower cost or fill out paperwork to get insurance to cover a needed medication.   If a prior authorization is required to get your medication covered by your insurance company, please allow [place] 1-2 business days to complete this process.  Drug prices often vary depending on where the prescription is filled and some pharmacies may offer cheaper prices.  The website www.goodrx.com contains coupons for medications through different pharmacies. The prices here do not account for what the cost may be with help from insurance (it may be cheaper with your insurance), but the website can give you the price if you did not use any insurance.  - You can print the associated coupon and take it with your prescription to the pharmacy.  - You may also stop by our office during regular business hours and pick up a GoodRx coupon card.  - If you need your prescription sent electronically to a different pharmacy, notify our office through [facility] or by phone at [phone] option 4.     Si Usted Necesita Algo Despus de Su Visita  Tambin puede enviarnos un mensaje a travs de Pharmacist, community. Por lo general respondemos a los mensajes de MyChart en el transcurso de 1 a 2 das hbiles.  Para renovar recetas, por favor pida a su farmacia que se ponga en contacto con nuestra oficina. Harland Dingwall de fax es [place] [phone].  Si tiene un asunto urgente cuando la clnica est cerrada y que no puede esperar hasta el siguiente da hbil, puede llamar/localizar a su doctor(a) al nmero que aparece a continuacin.   Por favor, tenga en cuenta que aunque hacemos  todo lo posible para estar disponibles para asuntos urgentes fuera del horario de [place], no estamos disponibles las 24 horas del da, los 7 das de la [place].   Si tiene un problema urgente y no puede comunicarse con nosotros, puede optar por buscar atencin mdica  en el consultorio de su doctor(a), en una clnica privada, en un centro de atencin urgente o en una sala de emergencias.  Si tiene Engineering geologist, por favor llame inmediatamente al 911 o vaya a la sala de emergencias.  Nmeros de bper  - Dr. Nehemiah Massed: [phone]  - Dra. Moye: [phone]  - Dra. Nicole Kindred: [phone]  En caso de inclemencias del [place], por favor llame a Johnsie Kindred principal al [phone] para una actualizacin sobre el [place] de cualquier retraso o cierre.  Consejos para la medicacin en dermatologa: Por favor, guarde las cajas en las que vienen los medicamentos de uso tpico para ayudarle a seguir las instrucciones sobre dnde y cmo usarlos. Las farmacias generalmente imprimen las instrucciones del medicamento slo  en las cajas y no directamente en los tubos del medicamento.   Si su medicamento es muy caro, por favor, pngase en contacto con Zigmund Daniel llamando al [phone] y presione la opcin 4 o envenos un mensaje a travs de Pharmacist, community.   No podemos decirle cul ser su copago por los medicamentos por adelantado ya que esto es diferente dependiendo de la cobertura de su seguro. Sin embargo, es posible que podamos encontrar un medicamento sustituto a Electrical engineer un formulario para que el seguro cubra el medicamento que se considera necesario.   Si se requiere una autorizacin previa para que su compaa de seguros [place] su medicamento, por favor permtanos de 1 a 2 das hbiles para completar este proceso.  Los precios de los medicamentos varan con frecuencia dependiendo del Environmental consultant de dnde se surte la receta y alguna farmacias pueden ofrecer precios ms baratos.  El  sitio web www.goodrx.com tiene cupones para medicamentos de Airline pilot. Los precios aqu no tienen en cuenta lo que podra costar con la ayuda del seguro (puede ser ms barato con su seguro), pero el sitio web puede darle el precio si no utiliz Research scientist (physical sciences).  - Puede imprimir el cupn correspondiente y llevarlo con su receta a la farmacia.  - Tambin puede pasar por nuestra oficina durante el horario de atencin regular y Charity fundraiser una tarjeta de cupones de GoodRx.  - Si necesita que su receta se enve electrnicamente a una farmacia diferente, informe a nuestra oficina a travs de MyChart de [place] o por telfono llamando al [phone] y presione la opcin 4.

## 2022-10-12 LAB — CBC WITH DIFFERENTIAL/PLATELET
Basophils Absolute: 0.1 10*3/uL (ref 0.0–0.2)
Basos: 1 %
EOS (ABSOLUTE): 0.4 10*3/uL (ref 0.0–0.4)
Eos: 3 %
Hematocrit: 41.3 % (ref 34.0–46.6)
Hemoglobin: 13.2 g/dL (ref 11.1–15.9)
Immature Grans (Abs): 0.1 10*3/uL (ref 0.0–0.1)
Immature Granulocytes: 1 %
Lymphocytes Absolute: 2.3 10*3/uL (ref 0.7–3.1)
Lymphs: 23 %
MCH: 26.9 pg (ref 26.6–33.0)
MCHC: 32 g/dL (ref 31.5–35.7)
MCV: 84 fL (ref 79–97)
Monocytes Absolute: 1.1 10*3/uL — ABNORMAL HIGH (ref 0.1–0.9)
Monocytes: 11 %
Neutrophils Absolute: 6.2 10*3/uL (ref 1.4–7.0)
Neutrophils: 61 %
Platelets: 450 10*3/uL (ref 150–450)
RBC: 4.9 x10E6/uL (ref 3.77–5.28)
RDW: 16.1 % — ABNORMAL HIGH (ref 11.7–15.4)
WBC: 10.2 10*3/uL (ref 3.4–10.8)

## 2022-10-12 LAB — SEDIMENTATION RATE: Sed Rate: 29 mm/hr (ref 0–32)

## 2022-10-16 ENCOUNTER — Telehealth: Payer: Self-pay

## 2022-10-16 NOTE — Telephone Encounter (Signed)
Advised pt of lab results.  Advised pt she can restart Spironolactone since she has finished the Prednisone taper. Advised pt to continue to d/c Adbry.  Moved patients f/u appt back 2 weeks./sh

## 2022-10-16 NOTE — Telephone Encounter (Signed)
-----   Message from Brendolyn Patty, MD sent at 10/16/2022 12:36 PM EST ----- CBC and Sed rate are normal, no eosinophilia - please call patient

## 2022-10-17 ENCOUNTER — Ambulatory Visit: Payer: 59 | Admitting: Dermatology

## 2022-11-01 ENCOUNTER — Encounter: Payer: Self-pay | Admitting: Dermatology

## 2022-11-01 ENCOUNTER — Ambulatory Visit (INDEPENDENT_AMBULATORY_CARE_PROVIDER_SITE_OTHER): Payer: 59 | Admitting: Dermatology

## 2022-11-01 VITALS — BP 122/84 | HR 103

## 2022-11-01 DIAGNOSIS — R21 Rash and other nonspecific skin eruption: Secondary | ICD-10-CM

## 2022-11-01 DIAGNOSIS — L301 Dyshidrosis [pompholyx]: Secondary | ICD-10-CM

## 2022-11-01 MED ORDER — CIBINQO 100 MG PO TABS: 1.0000 | ORAL_TABLET | Freq: Every day | ORAL | 2 refills | Status: DC

## 2022-11-01 NOTE — Progress Notes (Signed)
   Follow-Up Visit   Subjective  Sara Suarez is a 46 y.o. female who presents for the following: Eczema (1-2 month follow up. Atopic dermatitis and dyshidrotic dermatitis on hands and feet. Patient reports itching. Has not taken any more Adbry injections. Injection site rashes have recurred. Finished Prednisone tapered dose as directed. Using CeraVe/Clobetasol topical as directed. Has re-started Spironolactone since last visit. No increase or worsening since re-starting). Hx of reactions to Dupixent, Adbry, no response to RInvoq   The following portions of the chart were reviewed this encounter and updated as appropriate:      Review of Systems: No other skin or systemic complaints except as noted in HPI or Assessment and Plan.   Objective  Well appearing patient in no apparent distress; mood and affect are within normal limits.  A focused examination was performed including face, torso, arms, hands, feet. Relevant physical exam findings are noted in the Assessment and Plan.  torso, arms, legs Multiple excoriations at abdomen, arms, feet  Left Foot - Posterior Erythema with dyskeratosis, peeling, excoriations at feet.  Deep seated vesicles at palms   Assessment & Plan  Rash torso, arms, legs  Chronic and persistent condition with duration or expected duration over one year. Condition is symptomatic/ bothersome to patient. Not currently at goal.  Still itching, but no new rash.  Probable generalized allergic drug reaction to Adbry.  Pt has restarted Spironolactone without worsening of rash.  Continue Clobetasol/CeraVe compound bid as directed, as needed for rash/itching.  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 Condition is bothersome/symptomatic for patient. Currently flared.    Did not respond to 30 mg Rinvoq and multiple topicals.  She also had adverse reaction to Dupixent and  Adbry injections.   Continue Clobetasol cream twice daily as directed  Start Cibinqo 100 mg PO once daily.  Samples x2 (#28) given today, Lot: [id number], Exp: 09/2024. Plan labs in 3 months  Continue CeraVe/Clobetasol cream twice daily to body areas as needed.  Continue Eucrisa, Tacrolimus, and Clobetasol as directed as needed to affected areas for itching/rash.  Abrocitinib (CIBINQO) 100 MG TABS - Left Foot - Posterior Take 1 tablet by mouth daily.   Return in about 4 weeks (around 11/29/2022) for Atopic Dermatitis, Rash Follow Up.  I, Emelia Salisbury, CMA, am acting as scribe for Brendolyn Patty, MD.  Documentation: I have reviewed the above documentation for accuracy and completeness, and I agree with the above.  Brendolyn Patty MD

## 2022-11-01 NOTE — Patient Instructions (Addendum)
Start Cibinqo 100 mg once daily.    Continue CeraVe/Clobetasol cream twice daily to body areas as needed.  Continue Eucrisa, Tacrolimus, and Clobetasol as directed as needed to affected areas for itching/rash.  Due to recent changes in healthcare laws, you may see results of your pathology and/or laboratory studies on MyChart before the doctors have had a chance to review them. We understand that in some cases there may be results that are confusing or concerning to you. Please understand that not all results are received at the same time and often the doctors may need to interpret multiple results in order to provide you with the best plan of care or course of treatment. Therefore, we ask that you please give [place] 2 business days to thoroughly review all your results before contacting the office for clarification. Should we see a critical lab result, you will be contacted sooner.   If You Need Anything After Your Visit  If you have any questions or concerns for your doctor, please call our main line at [phone] and press option 4 to reach your doctor's medical assistant. If no one answers, please leave a voicemail as directed and we will return your call as soon as possible. Messages left after 4 pm will be answered the following business day.   You may also send [place] a message via [place]. We typically respond to MyChart messages within 1-2 business days.  For prescription refills, please ask your pharmacy to contact our office. Our fax number is [phone].  If you have an urgent issue when the clinic is closed that cannot wait until the next business day, you can page your doctor at the number below.    Please note that while we do our best to be available for urgent issues outside of office hours, we are not available 24/7.   If you have an urgent issue and are unable to reach [place], you may choose to seek medical care at your doctor's office, retail clinic, urgent care center, or emergency  room.  If you have a medical emergency, please immediately call 911 or go to the emergency department.  Pager Numbers  - Dr. Nehemiah Massed: [phone]  - Dr. Laurence Ferrari: [phone]  - Dr. Nicole Kindred: [phone]  In the event of inclement weather, please call our main line at [phone] for an update on the status of any delays or closures.  Dermatology Medication Tips: Please keep the boxes that topical medications come in in order to help keep track of the instructions about where and how to use these. Pharmacies typically print the medication instructions only on the boxes and not directly on the medication tubes.   If your medication is too expensive, please contact our office at [phone] option 4 or send [place] a message through [place].   We are unable to tell what your co-pay for medications will be in advance as this is different depending on your insurance coverage. However, we may be able to find a substitute medication at lower cost or fill out paperwork to get insurance to cover a needed medication.   If a prior authorization is required to get your medication covered by your insurance company, please allow [place] 1-2 business days to complete this process.  Drug prices often vary depending on where the prescription is filled and some pharmacies may offer cheaper prices.  The website www.goodrx.com contains coupons for medications through different pharmacies. The prices here do not account for what the cost may be with help from  insurance (it may be cheaper with your insurance), but the website can give you the price if you did not use any insurance.  - You can print the associated coupon and take it with your prescription to the pharmacy.  - You may also stop by our office during regular business hours and pick up a GoodRx coupon card.  - If you need your prescription sent electronically to a different pharmacy, notify our office through [facility] or by phone at [phone]  option 4.     Si Usted Necesita Algo Despus de Su Visita  Tambin puede enviarnos un mensaje a travs de Pharmacist, community. Por lo general respondemos a los mensajes de MyChart en el transcurso de 1 a 2 das hbiles.  Para renovar recetas, por favor pida a su farmacia que se ponga en contacto con nuestra oficina. Harland Dingwall de fax es [place] [phone].  Si tiene un asunto urgente cuando la clnica est cerrada y que no puede esperar hasta el siguiente da hbil, puede llamar/localizar a su doctor(a) al nmero que aparece a continuacin.   Por favor, tenga en cuenta que aunque hacemos todo lo posible para estar disponibles para asuntos urgentes fuera del horario de [place], no estamos disponibles las 24 horas del da, los 7 das de la [place].   Si tiene un problema urgente y no puede comunicarse con nosotros, puede optar por buscar atencin mdica  en el consultorio de su doctor(a), en una clnica privada, en un centro de atencin urgente o en una sala de emergencias.  Si tiene Engineering geologist, por favor llame inmediatamente al 911 o vaya a la sala de emergencias.  Nmeros de bper  - Dr. Nehemiah Massed: [phone]  - Dra. Moye: [phone]  - Dra. Nicole Kindred: [phone]  En caso de inclemencias del [place], por favor llame a Johnsie Kindred principal al [phone] para una actualizacin sobre el [place] de cualquier retraso o cierre.  Consejos para la medicacin en dermatologa: Por favor, guarde las cajas en las que vienen los medicamentos de uso tpico para ayudarle a seguir las instrucciones sobre dnde y cmo usarlos. Las farmacias generalmente imprimen las instrucciones del medicamento slo en las cajas y no directamente en los tubos del [place].   Si su medicamento es muy caro, por favor, pngase en contacto con Zigmund Daniel llamando al [phone] y presione la opcin 4 o envenos un mensaje a travs de Pharmacist, community.   No podemos decirle cul ser su copago por los medicamentos  por adelantado ya que esto es diferente dependiendo de la cobertura de su seguro. Sin embargo, es posible que podamos encontrar un medicamento sustituto a Electrical engineer un formulario para que el seguro cubra el medicamento que se considera necesario.   Si se requiere una autorizacin previa para que su compaa de seguros [place] su medicamento, por favor permtanos de 1 a 2 das hbiles para completar este proceso.  Los precios de los medicamentos varan con frecuencia dependiendo del Environmental consultant de dnde se surte la receta y alguna farmacias pueden ofrecer precios ms baratos.  El sitio web www.goodrx.com tiene cupones para medicamentos de Airline pilot. Los precios aqu no tienen en cuenta lo que podra costar con la ayuda del seguro (puede ser ms barato con su seguro), pero el sitio web puede darle el precio si no utiliz Research scientist (physical sciences).  - Puede imprimir el cupn correspondiente y llevarlo con su receta a la farmacia.  - Tambin puede pasar por nuestra oficina durante el horario de  atencin regular y Charity fundraiser una tarjeta de cupones de GoodRx.  - Si necesita que su receta se enve electrnicamente a una farmacia diferente, informe a nuestra oficina a travs de MyChart de [place] o por telfono llamando al [phone] y presione la opcin 4.

## 2022-11-21 ENCOUNTER — Ambulatory Visit: Payer: 59 | Admitting: Dermatology

## 2022-11-23 ENCOUNTER — Telehealth (INDEPENDENT_AMBULATORY_CARE_PROVIDER_SITE_OTHER): Payer: 59 | Admitting: Psychiatry

## 2022-11-23 ENCOUNTER — Encounter: Payer: Self-pay | Admitting: Psychiatry

## 2022-11-23 DIAGNOSIS — F5105 Insomnia due to other mental disorder: Secondary | ICD-10-CM

## 2022-11-23 DIAGNOSIS — F908 Attention-deficit hyperactivity disorder, other type: Secondary | ICD-10-CM | POA: Diagnosis not present

## 2022-11-23 DIAGNOSIS — F411 Generalized anxiety disorder: Secondary | ICD-10-CM

## 2022-11-23 NOTE — Progress Notes (Signed)
Virtual Visit via Video Note  I connected with Sara Suarez on 11/23/22 at  1:00 PM EST by a video enabled telemedicine application and verified that I am speaking with the correct person using two identifiers. Location Provider Location : [facility] Patient Location : Home  Participants: Patient , Provider    I discussed the limitations of evaluation and management by telemedicine and the availability of in person appointments. The patient expressed understanding and agreed to proceed.    I discussed the assessment and treatment plan with the patient. The patient was provided an opportunity to ask questions and all were answered. The patient agreed with the plan and demonstrated an understanding of the instructions.   The patient was advised to call back or seek an in-person evaluation if the symptoms worsen or if the condition fails to improve as anticipated.   [facility] MD OP Progress Note  11/24/2022 8:28 AM Darnesha Diloreto  MRN:  [id number]  Chief Complaint:  Chief Complaint  Patient presents with   Follow-up   Medication Refill   Anxiety   Depression   HPI: Lyrika Souders is a 47 year old Caucasian female, lives in [place], has a history of GAD, insomnia, ADHD was evaluated by telemedicine today.  Patient today reports she is overall doing fairly well with regards to her mood.  Denies any significant anxiety or depression symptoms.  Currently compliant on the Cymbalta.  Denies side effects.  Attention and focus continues to be good on the Vyvanse.  Follows up with her ADHD specialist on a regular basis.  Patient reports she recently had an ear infection, which was triggered by a dog hair that got into her inner ear.  It was removed and she is currently on medications, completed a tapering dose of steroids.  It is getting better.  Reports she had dizziness on and off however that has improved now.  She was advised to stay off of the losartan for her hypertension previously  when she had the dizziness.  Now she is back on it.  Monitoring her blood pressure.  Aware she needs to monitor it while on medications like Vyvanse as well as duloxetine.  Patient reports she looks forward to taking a hiking trip to [place].  Patient denies any suicidality, homicidality or perceptual disturbances.  Patient denies any other concerns today.  Visit Diagnosis:    ICD-10-CM   1. 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 Attention deficit hyperactivity disorder (ADHD), other type  F90.8       Past Psychiatric History: Reviewed past psychiatric history from progress note on 07/09/2019.  Past trials of Cymbalta, Klonopin, Ambien.  Past Medical History:  Past Medical History:  Diagnosis Date   ADHD (attention deficit hyperactivity disorder)    Allergy    Anxiety    Depression     Past Surgical History:  Procedure Laterality Date   BREAST CYST ASPIRATION Right    neg   ENDOSCOPIC PLANTAR FASCIOTOMY     FOOT SURGERY     MOUTH SURGERY     TUBAL LIGATION      Family Psychiatric History: Reviewed family psychiatric history from progress note on 07/09/2019.  Family History:  Family History  Problem Relation Age of Onset   Breast cancer Mother 66   Anxiety disorder Mother    Breast cancer Maternal Aunt 22   Anxiety disorder Sister  Panic disorder Sister    Dementia Maternal Grandmother    Dementia Paternal Grandmother     Social History: Reviewed social history from progress note on 07/09/2019. Social History   Socioeconomic History   Marital status: Married    Spouse name: jon   Number of children: 0   Years of education: Not on file   Highest education level: Bachelor's degree (e.g., BA, AB, BS)  Occupational History   Not on file  Tobacco Use   Smoking status: Never   Smokeless tobacco: Never  Vaping Use   Vaping Use: Never used  Substance and Sexual Activity   Alcohol use: Never   Drug  use: Never   Sexual activity: Not on file  Other Topics Concern   Not on file  Social History Narrative   Not on file   Social Determinants of Health   Financial Resource Strain: Low Risk  (07/09/2019)   Overall Financial Resource Strain (CARDIA)    Difficulty of Paying Living Expenses: Not hard at all  Food Insecurity: No Food Insecurity (07/09/2019)   Hunger Vital Sign    Worried About Running Out of Food in the Last Year: Never true    [place] in the Last Year: Never true  Transportation Needs: No Transportation Needs (07/09/2019)   PRAPARE - Hydrologist (Medical): No    Lack of Transportation (Non-Medical): No  Physical Activity: Inactive (07/09/2019)   Exercise Vital Sign    Days of Exercise per Week: 0 days    Minutes of Exercise per Session: 0 min  Stress: Stress Concern Present (07/09/2019)   [place]    Feeling of Stress : Rather much  Social Connections: Unknown (07/09/2019)   Social Connection and Isolation Panel [NHANES]    Frequency of Communication with Friends and Family: Not on file    Frequency of Social Gatherings with Friends and Family: Not on file    Attends Religious Services: Never    Active Member of Clubs or Organizations: Yes    Attends Archivist Meetings: More than 4 times per year    Marital Status: Married    Allergies:  Allergies  Allergen Reactions   Adbry [Tralokinumab-Ldrm] Rash   Atomoxetine Other (See Comments)    Heartburn   Guanfacine Other (See Comments)    drowsiness   Chlorpheniramine-Pseudoeph     Hyperactive   Dupilumab Hives   Pseudoephedrine Other (See Comments)    Hyperactive Other reaction(s): insomnia, shakey   Shellfish Allergy Other (See Comments)   Hydrocodone Itching   Prednisone Anxiety    insomnia    Metabolic Disorder Labs: No results found for: "HGBA1C", "MPG" No results found for:  "PROLACTIN" No results found for: "CHOL", "TRIG", "HDL", "CHOLHDL", "VLDL", "LDLCALC" No results found for: "TSH"  Therapeutic Level Labs: No results found for: "LITHIUM" No results found for: "VALPROATE" No results found for: "CBMZ"  Current Medications: Current Outpatient Medications  Medication Sig Dispense Refill   Abrocitinib (CIBINQO) 100 MG TABS Take 1 tablet by mouth daily. 30 tablet 2   albuterol (VENTOLIN HFA) 108 (90 Base) MCG/ACT inhaler Inhale 1-2 puffs into the lungs every 4 (four) hours as needed for wheezing or shortness of breath. 1 each 0   azelastine (ASTELIN) 0.1 % nasal spray azelastine 137 mcg (0.1 %) nasal spray aerosol     clobetasol (TEMOVATE) 0.05 % external solution Mix with CeraVe cream and use as directed.  Avoid applying to face, groin, and axilla. 50 mL 2   clobetasol cream (TEMOVATE) 0.05 % Apply to affected areas rash on hands and foot twice daily for flares until improved. Avoid face, groin, underarms. 60 g 1   Crisaborole (EUCRISA) 2 % OINT APPLY TO AFFECTED AREAS HANDS AND FEET 1-2 TIMES A DAY FOR MAINTENANCE. 60 g 2   DULoxetine (CYMBALTA) 20 MG capsule TAKE 2 CAPSULES (40 MG TOTAL) BY MOUTH DAILY. STOP DULOXETINE 60 MG 180 capsule 0   EPINEPHrine 0.3 mg/0.3 mL IJ SOAJ injection See admin instructions.     gabapentin (NEURONTIN) 300 MG capsule Take by mouth.     hydrOXYzine (VISTARIL) 50 MG capsule TAKE 1 CAPSULE (50 MG TOTAL) BY MOUTH 2 (TWO) TIMES DAILY AS NEEDED. 180 capsule 1   ibuprofen (ADVIL) 600 MG tablet Take 1 tablet (600 mg total) by mouth every 6 (six) hours as needed. 30 tablet 0   losartan (COZAAR) 25 MG tablet Take 1 tablet by mouth daily.     mupirocin ointment (BACTROBAN) 2 % Apply 1 Application topically daily. 22 g 0   Na Sulfate-K Sulfate-Mg Sulf 17.5-3.13-1.6 GM/177ML SOLN Take 1 Bottle by mouth as directed.     Spacer/Aero-Holding Chambers (AEROCHAMBER MV) inhaler Use as instructed 1 each 1   spironolactone (ALDACTONE) 50 MG tablet  Take 1-2 tablets by mouth every day for acne. 60 tablet 2   SUMAtriptan (IMITREX) 50 MG tablet Take by mouth.     triamcinolone (NASACORT) 55 MCG/ACT AERO nasal inhaler Nasal Allergy 55 mcg spray aerosol  2 SPRAYS ONCE A DAY NASALLY 30 DAYS     VITAMIN D PO Take by mouth.     VYVANSE 40 MG CHEW Chew 1 tablet by mouth daily.     zolpidem (AMBIEN) 5 MG tablet Take 1-1.5 tablets (5-7.5 mg total) by mouth at bedtime as needed for sleep. 45 tablet 1   cyclobenzaprine (FLEXERIL) 5 MG tablet Take by mouth. (Patient not taking: Reported on 08/15/2022)     Diclofenac Sodium (PENNSAID) 2 % SOLN      predniSONE (DELTASONE) 10 MG tablet take 4 for 2 days, take 3 for 1 day, take 2 for 1 day, take 1 for 1 day. Take first thing in morning with breakfast. (Patient not taking: Reported on 11/23/2022) 14 tablet 0   promethazine-dextromethorphan (PROMETHAZINE-DM) 6.25-15 MG/5ML syrup Take 5 mLs by mouth 4 (four) times daily as needed for cough. (Patient not taking: Reported on 11/23/2022) 118 mL 0   tacrolimus (PROTOPIC) 0.1 % ointment Apply topically as directed. Qd to bid to aa hands, right foot until clear, then prn flares (Patient not taking: Reported on 11/23/2022) 100 g 2   No current facility-administered medications for this visit.     Musculoskeletal: Strength & Muscle Tone:  UTA Gait & Station:  Seated Patient leans: N/A  Psychiatric Specialty Exam: Review of Systems  Psychiatric/Behavioral: Negative.    All other systems reviewed and are negative.   There were no vitals taken for this visit.There is no height or weight on file to calculate BMI.  General Appearance: Casual  Eye Contact:  Fair  Speech:  Clear and Coherent  Volume:  Normal  Mood:  Euthymic  Affect:  Congruent  Thought Process:  Goal Directed and Descriptions of Associations: Intact  Orientation:  Full (Time, Place, and Person)  Thought Content: Logical   Suicidal Thoughts:  No  Homicidal Thoughts:  No  Memory:  Immediate;    Fair Recent;  Fair Remote;   Fair  Judgement:  Fair  Insight:  Fair  Psychomotor Activity:  Normal  Concentration:  Concentration: Fair and Attention Span: Fair  Recall:  [employer] of Knowledge: Fair  Language: Fair  Akathisia:  No  Handed:  Right  AIMS (if indicated): not done  Assets:  Communication Skills Desire for Improvement Housing Social Support  ADL's:  Intact  Cognition: WNL  Sleep:  Fair   Screenings: AIMS    Flowsheet Row Video Visit from 06/19/2022 in [facility] Total Score 0      GAD-7    Flowsheet Row Video Visit from 06/19/2022 in [facility] Office Visit from 12/29/2021 in [facility]  Total GAD-7 Score 7 11      PHQ2-9    [place] Video Visit from 08/15/2022 in [facility] Video Visit from 06/19/2022 in [facility] Office Visit from 12/29/2021 in [facility] Video Visit from 02/01/2021 in [facility]  PHQ-2 Total Score 0 0 0 0      Flowsheet Row Video Visit from 11/23/2022 in [facility] Video Visit from 08/15/2022 in [facility] from 08/09/2022 in [facility] No Risk No Risk No Risk        Assessment and Plan: Sara Suarez is a 47 year old Caucasian female, employed, lives in [place], married, has a history of GAD, insomnia was evaluated by telemedicine today.  Patient is currently stable.  Plan as noted below.  Plan GAD-stable Cymbalta 40 mg p.o. daily-reduced dosage Gabapentin 300 mg p.o. 3 times daily.  Prescribed for pain Continue CBT with Ms. Nathaniel Man as needed  Insomnia-stable Ambien 7.5 mg p.o. nightly Continue CPAP  ADHD-continue to follow-up with [facility] attention specialist. Vyvanse 40 mg chewable tablet in  divided dosage as prescribed.  Follow-up in clinic in 5 months or sooner in person.      Consent: Patient/Guardian gives verbal consent for treatment and assignment of benefits for services provided during this visit. Patient/Guardian expressed understanding and agreed to proceed.   This note was generated in part or whole with voice recognition software. Voice recognition is usually quite accurate but there are transcription errors that can and very often do occur. I apologize for any typographical errors that were not detected and corrected.      Ursula Alert, MD 11/24/2022, 8:28 AM

## 2022-11-29 ENCOUNTER — Ambulatory Visit: Payer: 59 | Admitting: Dermatology

## 2022-11-29 VITALS — BP 123/87

## 2022-11-29 DIAGNOSIS — L209 Atopic dermatitis, unspecified: Secondary | ICD-10-CM

## 2022-11-29 NOTE — Patient Instructions (Signed)
Due to recent changes in healthcare laws, you may see results of your pathology and/or laboratory studies on MyChart before the doctors have had a chance to review them. We understand that in some cases there may be results that are confusing or concerning to you. Please understand that not all results are received at the same time and often the doctors may need to interpret multiple results in order to provide you with the best plan of care or course of treatment. Therefore, we ask that you please give us 2 business days to thoroughly review all your results before contacting the office for clarification. Should we see a critical lab result, you will be contacted sooner.   If You Need Anything After Your Visit  If you have any questions or concerns for your doctor, please call our main line at [phone] and press option 4 to reach your doctor's medical assistant. If no one answers, please leave a voicemail as directed and we will return your call as soon as possible. Messages left after 4 pm will be answered the following business day.   You may also send us a message via MyChart. We typically respond to MyChart messages within 1-2 business days.  For prescription refills, please ask your pharmacy to contact our office. Our fax number is [phone].  If you have an urgent issue when the clinic is closed that cannot wait until the next business day, you can page your doctor at the number below.    Please note that while we do our best to be available for urgent issues outside of office hours, we are not available 24/7.   If you have an urgent issue and are unable to reach us, you may choose to seek medical care at your doctor's office, retail clinic, urgent care center, or emergency room.  If you have a medical emergency, please immediately call 911 or go to the emergency department.  Pager Numbers  - Dr. Kowalski: [phone]  - Dr. Moye: [phone]  - Dr. Stewart:  [phone]  In the event of inclement weather, please call our main line at [phone] for an update on the status of any delays or closures.  Dermatology Medication Tips: Please keep the boxes that topical medications come in in order to help keep track of the instructions about where and how to use these. Pharmacies typically print the medication instructions only on the boxes and not directly on the medication tubes.   If your medication is too expensive, please contact our office at [phone] option 4 or send us a message through MyChart.   We are unable to tell what your co-pay for medications will be in advance as this is different depending on your insurance coverage. However, we may be able to find a substitute medication at lower cost or fill out paperwork to get insurance to cover a needed medication.   If a prior authorization is required to get your medication covered by your insurance company, please allow us 1-2 business days to complete this process.  Drug prices often vary depending on where the prescription is filled and some pharmacies may offer cheaper prices.  The website www.goodrx.com contains coupons for medications through different pharmacies. The prices here do not account for what the cost may be with help from insurance (it may be cheaper with your insurance), but the website can give you the price if you did not use any insurance.  - You can print the associated coupon and take it with   your prescription to the pharmacy.  - You may also stop by our office during regular business hours and pick up a GoodRx coupon card.  - If you need your prescription sent electronically to a different pharmacy, notify our office through [facility] MyChart or by phone at [phone] option 4.     Si Usted Necesita Algo Despus de Su Visita  Tambin puede enviarnos un mensaje a travs de MyChart. Por lo general respondemos a los mensajes de MyChart en el transcurso de 1 a 2  das hbiles.  Para renovar recetas, por favor pida a su farmacia que se ponga en contacto con nuestra oficina. Nuestro nmero de fax es el [phone].  Si tiene un asunto urgente cuando la clnica est cerrada y que no puede esperar hasta el siguiente da hbil, puede llamar/localizar a su doctor(a) al nmero que aparece a continuacin.   Por favor, tenga en cuenta que aunque hacemos todo lo posible para estar disponibles para asuntos urgentes fuera del horario de oficina, no estamos disponibles las 24 horas del da, los 7 das de la semana.   Si tiene un problema urgente y no puede comunicarse con nosotros, puede optar por buscar atencin mdica  en el consultorio de su doctor(a), en una clnica privada, en un centro de atencin urgente o en una sala de emergencias.  Si tiene una emergencia mdica, por favor llame inmediatamente al 911 o vaya a la sala de emergencias.  Nmeros de bper  - Dr. Kowalski: [phone]  - Dra. Moye: [phone]  - Dra. Stewart: [phone]  En caso de inclemencias del tiempo, por favor llame a nuestra lnea principal al [phone] para una actualizacin sobre el estado de cualquier retraso o cierre.  Consejos para la medicacin en dermatologa: Por favor, guarde las cajas en las que vienen los medicamentos de uso tpico para ayudarle a seguir las instrucciones sobre dnde y cmo usarlos. Las farmacias generalmente imprimen las instrucciones del medicamento slo en las cajas y no directamente en los tubos del medicamento.   Si su medicamento es muy caro, por favor, pngase en contacto con nuestra oficina llamando al [phone] y presione la opcin 4 o envenos un mensaje a travs de MyChart.   No podemos decirle cul ser su copago por los medicamentos por adelantado ya que esto es diferente dependiendo de la cobertura de su seguro. Sin embargo, es posible que podamos encontrar un medicamento sustituto a menor costo o llenar un formulario para que el  seguro cubra el medicamento que se considera necesario.   Si se requiere una autorizacin previa para que su compaa de seguros cubra su medicamento, por favor permtanos de 1 a 2 das hbiles para completar este proceso.  Los precios de los medicamentos varan con frecuencia dependiendo del lugar de dnde se surte la receta y alguna farmacias pueden ofrecer precios ms baratos.  El sitio web www.goodrx.com tiene cupones para medicamentos de diferentes farmacias. Los precios aqu no tienen en cuenta lo que podra costar con la ayuda del seguro (puede ser ms barato con su seguro), pero el sitio web puede darle el precio si no utiliz ningn seguro.  - Puede imprimir el cupn correspondiente y llevarlo con su receta a la farmacia.  - Tambin puede pasar por nuestra oficina durante el horario de atencin regular y recoger una tarjeta de cupones de GoodRx.  - Si necesita que su receta se enve electrnicamente a una farmacia diferente, informe a nuestra oficina a travs de MyChart de [facility]   o por telfono llamando al [phone] y presione la opcin 4.  

## 2022-11-29 NOTE — Progress Notes (Signed)
Follow-Up Visit   Subjective  Sara Suarez is a 47 y.o. female who presents for the following: Follow-up.  Patient presents for 2 month follow-up Eczema and Dyshidrotic Dermatitis. She is taking Cibinqo 100 MG daily with no side effects. She has some new blisters, but much improved from previous outbreaks since starting Cibinqo. She uses Clobetasol/CeraVe mix daily, and Eucrisa ointment, tacrolimus ointment, and clobetasol cream as needed. Blisters are very itchy (8 of 10) when they first come up, very hard not to scratch. Feet swell making it uncomfortable and painful to wear shoes. As a result, she is not able to exercise as much as she would like. She has 1-2 glasses of wine nightly after dinner.  Hx of severe allergic reactions to Dupixent, Adbry, no response to Rinvoq.  Cinbinqo is working well to decrease itching and rash.   The following portions of the chart were reviewed this encounter and updated as appropriate:       Review of Systems:  No other skin or systemic complaints except as noted in HPI or Assessment and Plan.  Objective  Well appearing patient in no apparent distress; mood and affect are within normal limits.  A focused examination was performed including face, hands, feet, arms, trunk. Relevant physical exam findings are noted in the Assessment and Plan.  trunk, arms, legs Mild erythema with crusted vesicles of the dorsal toes and plantar feet;  deep-seated vesicles of the palms; erythema and scale of the web spaces; healing excoriations and mild erythema of the abdomen and arms.  BSA (including itch) >10%  Scorad score is 69 per questionaire    Assessment & Plan  Atopic dermatitis, unspecified type trunk, arms, legs  With severe Dyshidrotic Dermatitis  Chronic and persistent condition with duration or expected duration over one year. Condition is bothersome/symptomatic for patient. Improving on Cibinqo samples, but not at goal.  Insurance has denied  appeal for prior approval of Cibinqo.  1st appeal to Cibinqo was denied since patient did not have >10% BSA documented and has not tried methotrexate.  Will resubmit for 2nd appeal.  Pt has Scorad score of 69, has >10% BSA, has contraindication to MTX due to daily alcohol ingestion and risk of liver toxicity, and has had severe allergic reactions to Dupixent and Adbry injections.  She has tried and failed 30 mg Rinvoq, and multiple topical steroids including clobetasol.  Also tried and failed tacrolimus ointment and Eucrisa ointment.       Atopic dermatitis (eczema) is a chronic, relapsing, pruritic condition that can significantly affect quality of life. It is often associated with allergic rhinitis and/or asthma and can require treatment with topical medications, phototherapy, or in severe cases, biologic medications which require long term medication management.      Continue Cibinqo 100 MG take 1 po QD, samples x 5 (#70) given today, [id number] Exp 02/2023.  Discussed potential risks/benefits of JAK inhibitors (Rinvoq/Cibinqo) which are new oral biologic medications used for treating severe, refractory atopic dermatitis.  They are very effective in resolving itchy eczema rashes of the skin.  Overall potential side effects are very minimal and mild and they are much safer than oral steroids. The black box warning for the JAK inhibitor class was discussed, including rare cardiovascular effects, thrombosis, and serious bacterial and viral infections, such as TB or herpes zoster, although these more severe side effects have not been seen with Rinvoq or Cibinqo in their safety trials.  Baseline lab tests are done prior to initiation  of medication to ensure pt has no underlying lab abnormalities, and then rechecked at 3 months.  If normal, then periodic doctor visits and annual labs are performed for long term medication management.   Continue CeraVe/Clobetasol cream twice daily to body areas as needed.   Continue Eucrisa, Tacrolimus, and Clobetasol as directed as needed to affected areas for itching/rash.   Plan labs on follow-up. CBC/diff, Lipid profile  Topical steroids (such as triamcinolone, fluocinolone, fluocinonide, mometasone, clobetasol, halobetasol, betamethasone, hydrocortisone) can cause thinning and lightening of the skin if they are used for too long in the same area. Your physician has selected the right strength medicine for your problem and area affected on the body. Please use your medication only as directed by your physician to prevent side effects.     Return in about 2 months (around 01/28/2023) for Atopic Dermatitis.  IJamesetta Orleans, CMA, am acting as scribe for Brendolyn Patty, MD .  Documentation: I have reviewed the above documentation for accuracy and completeness, and I agree with the above.  Brendolyn Patty MD

## 2022-12-04 ENCOUNTER — Encounter: Payer: Self-pay | Admitting: Dermatology

## 2023-01-08 ENCOUNTER — Encounter: Payer: Self-pay | Admitting: Dermatology

## 2023-01-14 ENCOUNTER — Other Ambulatory Visit: Payer: Self-pay | Admitting: Psychiatry

## 2023-01-14 DIAGNOSIS — F411 Generalized anxiety disorder: Secondary | ICD-10-CM

## 2023-02-05 ENCOUNTER — Other Ambulatory Visit: Payer: Self-pay

## 2023-02-05 ENCOUNTER — Ambulatory Visit: Payer: 59 | Admitting: Dermatology

## 2023-02-05 ENCOUNTER — Encounter: Payer: Self-pay | Admitting: Dermatology

## 2023-02-05 DIAGNOSIS — Z79899 Other long term (current) drug therapy: Secondary | ICD-10-CM

## 2023-02-05 DIAGNOSIS — R21 Rash and other nonspecific skin eruption: Secondary | ICD-10-CM

## 2023-02-05 DIAGNOSIS — L2089 Other atopic dermatitis: Secondary | ICD-10-CM

## 2023-02-05 NOTE — Patient Instructions (Signed)
Due to recent changes in healthcare laws, you may see results of your pathology and/or laboratory studies on MyChart before the doctors have had a chance to review them. We understand that in some cases there may be results that are confusing or concerning to you. Please understand that not all results are received at the same time and often the doctors may need to interpret multiple results in order to provide you with the best plan of care or course of treatment. Therefore, we ask that you please give us 2 business days to thoroughly review all your results before contacting the office for clarification. Should we see a critical lab result, you will be contacted sooner.   If You Need Anything After Your Visit  If you have any questions or concerns for your doctor, please call our main line at [phone] and press option 4 to reach your doctor's medical assistant. If no one answers, please leave a voicemail as directed and we will return your call as soon as possible. Messages left after 4 pm will be answered the following business day.   You may also send us a message via MyChart. We typically respond to MyChart messages within 1-2 business days.  For prescription refills, please ask your pharmacy to contact our office. Our fax number is [phone].  If you have an urgent issue when the clinic is closed that cannot wait until the next business day, you can page your doctor at the number below.    Please note that while we do our best to be available for urgent issues outside of office hours, we are not available 24/7.   If you have an urgent issue and are unable to reach us, you may choose to seek medical care at your doctor's office, retail clinic, urgent care center, or emergency room.  If you have a medical emergency, please immediately call 911 or go to the emergency department.  Pager Numbers  - Dr. Kowalski: [phone]  - Dr. Moye: [phone]  - Dr. Stewart:  [phone]  In the event of inclement weather, please call our main line at [phone] for an update on the status of any delays or closures.  Dermatology Medication Tips: Please keep the boxes that topical medications come in in order to help keep track of the instructions about where and how to use these. Pharmacies typically print the medication instructions only on the boxes and not directly on the medication tubes.   If your medication is too expensive, please contact our office at [phone] option 4 or send us a message through MyChart.   We are unable to tell what your co-pay for medications will be in advance as this is different depending on your insurance coverage. However, we may be able to find a substitute medication at lower cost or fill out paperwork to get insurance to cover a needed medication.   If a prior authorization is required to get your medication covered by your insurance company, please allow us 1-2 business days to complete this process.  Drug prices often vary depending on where the prescription is filled and some pharmacies may offer cheaper prices.  The website www.goodrx.com contains coupons for medications through different pharmacies. The prices here do not account for what the cost may be with help from insurance (it may be cheaper with your insurance), but the website can give you the price if you did not use any insurance.  - You can print the associated coupon and take it with   your prescription to the pharmacy.  - You may also stop by our office during regular business hours and pick up a GoodRx coupon card.  - If you need your prescription sent electronically to a different pharmacy, notify our office through [facility] or by phone at [phone] option 4.     Si Usted Necesita Algo Despus de Su Visita  Tambin puede enviarnos un mensaje a travs de MyChart. Por lo general respondemos a los mensajes de MyChart en el transcurso de 1 a 2  das hbiles.  Para renovar recetas, por favor pida a su farmacia que se ponga en contacto con nuestra oficina. Nuestro nmero de fax es el [phone].  Si tiene un asunto urgente cuando la clnica est cerrada y que no puede esperar hasta el siguiente da hbil, puede llamar/localizar a su doctor(a) al nmero que aparece a continuacin.   Por favor, tenga en cuenta que aunque hacemos todo lo posible para estar disponibles para asuntos urgentes fuera del horario de oficina, no estamos disponibles las 24 horas del da, los 7 das de la semana.   Si tiene un problema urgente y no puede comunicarse con nosotros, puede optar por buscar atencin mdica  en el consultorio de su doctor(a), en una clnica privada, en un centro de atencin urgente o en una sala de emergencias.  Si tiene una emergencia mdica, por favor llame inmediatamente al 911 o vaya a la sala de emergencias.  Nmeros de bper  - Dr. Kowalski: [phone]  - Dra. Moye: [phone]  - Dra. Stewart: [phone]  En caso de inclemencias del tiempo, por favor llame a nuestra lnea principal al [phone] para una actualizacin sobre el estado de cualquier retraso o cierre.  Consejos para la medicacin en dermatologa: Por favor, guarde las cajas en las que vienen los medicamentos de uso tpico para ayudarle a seguir las instrucciones sobre dnde y cmo usarlos. Las farmacias generalmente imprimen las instrucciones del medicamento slo en las cajas y no directamente en los tubos del medicamento.   Si su medicamento es muy caro, por favor, pngase en contacto con nuestra oficina llamando al [phone] y presione la opcin 4 o envenos un mensaje a travs de MyChart.   No podemos decirle cul ser su copago por los medicamentos por adelantado ya que esto es diferente dependiendo de la cobertura de su seguro. Sin embargo, es posible que podamos encontrar un medicamento sustituto a menor costo o llenar un formulario para que el  seguro cubra el medicamento que se considera necesario.   Si se requiere una autorizacin previa para que su compaa de seguros cubra su medicamento, por favor permtanos de 1 a 2 das hbiles para completar este proceso.  Los precios de los medicamentos varan con frecuencia dependiendo del lugar de dnde se surte la receta y alguna farmacias pueden ofrecer precios ms baratos.  El sitio web www.goodrx.com tiene cupones para medicamentos de diferentes farmacias. Los precios aqu no tienen en cuenta lo que podra costar con la ayuda del seguro (puede ser ms barato con su seguro), pero el sitio web puede darle el precio si no utiliz ningn seguro.  - Puede imprimir el cupn correspondiente y llevarlo con su receta a la farmacia.  - Tambin puede pasar por nuestra oficina durante el horario de atencin regular y recoger una tarjeta de cupones de GoodRx.  - Si necesita que su receta se enve electrnicamente a una farmacia diferente, informe a nuestra oficina a travs de MyChart de [facility]   o por telfono llamando al [phone] y presione la opcin 4.  

## 2023-02-05 NOTE — Progress Notes (Signed)
   Follow-Up Visit   Subjective  Sara Suarez is a 47 y.o. female who presents for the following: Atopic Dermatitis  Patient currently taking Cibinqo 100 mg daily. She was using clobetasol/CeraVe mix, Eucrisa and tacrolimus but has been [place] of [place] 4 [place] and did not take topicals with her. She flared while in [place] due to the heat.  She has improved some since she'd been back, but hands and feet still painful.  No side effects from medication.  The following portions of the chart were reviewed this encounter and updated as appropriate: medications, allergies, medical history  Review of Systems:  No other skin or systemic complaints except as noted in HPI or Assessment and Plan.  Objective  Well appearing patient in no apparent distress; mood and affect are within normal limits.  Areas Examined: Hands, arms, feet  Relevant physical exam findings are noted in the Assessment and Plan.    Assessment & Plan      ATOPIC DERMATITIS- severe palmarplantar involvement Exam: scattered pustules with erythema and crusted excoriations at palm, R planter foot. R plantar foot also with hyperkeratosis with lateral and web space involvement  Chronic and persistent condition with duration or expected duration over one year. Condition is bothersome/symptomatic for patient. Currently flared.   Atopic dermatitis - Severe, on Jak inhibitor (biologic medication).  Atopic dermatitis (eczema) is a chronic, relapsing, pruritic condition that can significantly affect quality of life. It is often associated with allergic rhinitis and/or asthma and can require treatment with topical medications, phototherapy, or in severe cases biologic medications, which require long term medication management.    Treatment Plan: Continue Cibinqo increasing to 200 mg daily. Samples x 4 bottles given to patient. Lot # [id number]  Exp: 09/2024 Restart CeraVe/Clobetasol cream twice daily to body areas as  needed.  Restart Eucrisa, Tacrolimus, and Clobetasol as directed as needed to affected areas hands/feet for itching/rash.               Labs for monitoring on Jak inhibitor: CMP, CBC/diff, Lipid profile ordered today.  Discussed potential risks/benefits of JAK inhibitors (Rinvoq/Cibinqo) which are new oral biologic medications used for treating severe, refractory atopic dermatitis.  They are very effective in resolving itchy eczema rashes of the skin.  Overall potential side effects are very minimal and mild and they are much safer than oral steroids. The black box warning for the JAK inhibitor class was discussed, including rare cardiovascular effects, thrombosis, and serious bacterial and viral infections, such as TB or herpes zoster, although the severe CV side effects have not been seen with Rinvoq or Cibinqo in their safety trials.  Shingles vaccination may be recommended prior to starting medication.  Baseline lab tests are done prior to initiation of medication to ensure pt has no underlying lab abnormalities or infection, and then rechecked at 3 months.  If normal, then periodic doctor visits and annual labs are performed for long term medication management.   Recommend gentle skin care.   Return in about 1 month (around 03/08/2023) for Dermatitis.  [name], RMA, am acting as scribe for Brendolyn Patty, MD .   Documentation: I have reviewed the above documentation for accuracy and completeness, and I agree with the above.  Brendolyn Patty, MD

## 2023-02-06 ENCOUNTER — Encounter: Payer: Self-pay | Admitting: Dermatology

## 2023-02-07 ENCOUNTER — Telehealth: Payer: Self-pay

## 2023-02-07 LAB — COMPREHENSIVE METABOLIC PANEL
ALT: 32 IU/L (ref 0–32)
AST: 34 IU/L (ref 0–40)
Albumin/Globulin Ratio: 1.7 (ref 1.2–2.2)
Albumin: 4.4 g/dL (ref 3.9–4.9)
Alkaline Phosphatase: 52 IU/L (ref 44–121)
BUN/Creatinine Ratio: 13 (ref 9–23)
BUN: 15 mg/dL (ref 6–24)
Bilirubin Total: 0.3 mg/dL (ref 0.0–1.2)
CO2: 22 mmol/L (ref 20–29)
Calcium: 9.6 mg/dL (ref 8.7–10.2)
Chloride: 100 mmol/L (ref 96–106)
Creatinine, Ser: 1.13 mg/dL — ABNORMAL HIGH (ref 0.57–1.00)
Globulin, Total: 2.6 g/dL (ref 1.5–4.5)
Glucose: 83 mg/dL (ref 70–99)
Potassium: 4.5 mmol/L (ref 3.5–5.2)
Sodium: 138 mmol/L (ref 134–144)
Total Protein: 7 g/dL (ref 6.0–8.5)
eGFR: 61 mL/min/{1.73_m2} (ref >59)

## 2023-02-07 LAB — LIPID PANEL
Chol/HDL Ratio: 3.6 ratio (ref 0.0–4.4)
Cholesterol, Total: 173 mg/dL (ref 100–199)
HDL: 48 mg/dL (ref >39)
LDL Chol Calc (NIH): 108 mg/dL — ABNORMAL HIGH (ref 0–99)
Triglycerides: 94 mg/dL (ref 0–149)
VLDL Cholesterol Cal: 17 mg/dL (ref 5–40)

## 2023-02-07 LAB — CBC WITH DIFFERENTIAL/PLATELET
Basophils Absolute: 0.1 10*3/uL (ref 0.0–0.2)
Basos: 1 %
EOS (ABSOLUTE): 0.4 10*3/uL (ref 0.0–0.4)
Eos: 4 %
Hematocrit: 41.6 % (ref 34.0–46.6)
Hemoglobin: 13.3 g/dL (ref 11.1–15.9)
Immature Grans (Abs): 0.2 10*3/uL — ABNORMAL HIGH (ref 0.0–0.1)
Immature Granulocytes: 2 %
Lymphocytes Absolute: 1.2 10*3/uL (ref 0.7–3.1)
Lymphs: 14 %
MCH: 26.3 pg — ABNORMAL LOW (ref 26.6–33.0)
MCHC: 32 g/dL (ref 31.5–35.7)
MCV: 82 fL (ref 79–97)
Monocytes Absolute: 0.7 10*3/uL (ref 0.1–0.9)
Monocytes: 7 %
Neutrophils Absolute: 6.5 10*3/uL (ref 1.4–7.0)
Neutrophils: 72 %
Platelets: 261 10*3/uL (ref 150–450)
RBC: 5.06 x10E6/uL (ref 3.77–5.28)
RDW: 15.9 % — ABNORMAL HIGH (ref 11.7–15.4)
WBC: 8.9 10*3/uL (ref 3.4–10.8)

## 2023-02-07 NOTE — Telephone Encounter (Signed)
Advised patient labs were ok and she can continue Cibinqo 2 tabs per day.

## 2023-02-07 NOTE — Telephone Encounter (Signed)
-----   Message from Brendolyn Patty, MD sent at 02/07/2023 10:21 AM EDT ----- Labs ok, continue Cibinqo (increase to 2 tabs per day) - please call patient

## 2023-02-13 ENCOUNTER — Ambulatory Visit: Payer: 59 | Admitting: Dermatology

## 2023-02-20 DIAGNOSIS — E669 Obesity, unspecified: Secondary | ICD-10-CM | POA: Insufficient documentation

## 2023-02-21 ENCOUNTER — Other Ambulatory Visit: Payer: Self-pay | Admitting: Gerontology

## 2023-02-21 DIAGNOSIS — Z1231 Encounter for screening mammogram for malignant neoplasm of breast: Secondary | ICD-10-CM

## 2023-02-21 DIAGNOSIS — I10 Essential (primary) hypertension: Secondary | ICD-10-CM | POA: Insufficient documentation

## 2023-02-21 DIAGNOSIS — L301 Dyshidrosis [pompholyx]: Secondary | ICD-10-CM | POA: Insufficient documentation

## 2023-02-22 ENCOUNTER — Telehealth: Payer: Self-pay | Admitting: Psychiatry

## 2023-02-22 DIAGNOSIS — F5105 Insomnia due to other mental disorder: Secondary | ICD-10-CM

## 2023-02-22 MED ORDER — ZOLPIDEM TARTRATE 5 MG PO TABS: 5.0000 mg | ORAL_TABLET | Freq: Every evening | ORAL | 1 refills | Status: AC | PRN

## 2023-02-22 NOTE — Telephone Encounter (Signed)
I have sent zolpidem to pharmacy. 

## 2023-03-12 ENCOUNTER — Ambulatory Visit: Payer: 59 | Admitting: Dermatology

## 2023-03-12 ENCOUNTER — Encounter: Payer: Self-pay | Admitting: Dermatology

## 2023-03-12 DIAGNOSIS — Z79899 Other long term (current) drug therapy: Secondary | ICD-10-CM

## 2023-03-12 DIAGNOSIS — L2089 Other atopic dermatitis: Secondary | ICD-10-CM | POA: Diagnosis not present

## 2023-03-12 MED ORDER — DUPILUMAB 300 MG/2ML ~~LOC~~ SOSY
300.0000 mg | PREFILLED_SYRINGE | Freq: Once | SUBCUTANEOUS | Status: AC
Start: 2023-03-12 — End: 2023-03-12
  Administered 2023-03-12: 300 mg via SUBCUTANEOUS

## 2023-03-12 NOTE — Patient Instructions (Signed)
Discontinue Cibinqo. Continue CeraVe/Clobetasol cream twice daily to body areas as needed.  Continue Eucrisa, Tacrolimus, and Clobetasol as directed as needed to affected areas hands/feet for itching/rash.  Due to recent changes in healthcare laws, you may see results of your pathology and/or laboratory studies on MyChart before the doctors have had a chance to review them. We understand that in some cases there may be results that are confusing or concerning to you. Please understand that not all results are received at the same time and often the doctors may need to interpret multiple results in order to provide you with the best plan of care or course of treatment. Therefore, we ask that you please give [place] 2 business days to thoroughly review all your results before contacting the office for clarification. Should we see a critical lab result, you will be contacted sooner.   If You Need Anything After Your Visit  If you have any questions or concerns for your doctor, please call our main line at [phone] and press option 4 to reach your doctor's medical assistant. If no one answers, please leave a voicemail as directed and we will return your call as soon as possible. Messages left after 4 pm will be answered the following business day.   You may also send [place] a message via MyChart. We typically respond to MyChart messages within 1-2 business days.  For prescription refills, please ask your pharmacy to contact our office. Our fax number is [phone].  If you have an urgent issue when the clinic is closed that cannot wait until the next business day, you can page your doctor at the number below.    Please note that while we do our best to be available for urgent issues outside of office hours, we are not available 24/7.   If you have an urgent issue and are unable to reach [place], you may choose to seek medical care at your doctor's office, retail clinic, urgent care center, or emergency  room.  If you have a medical emergency, please immediately call 911 or go to the emergency department.  Pager Numbers  - Dr. Gwen Pounds: [phone]  - Dr. Neale Burly: [phone]  - Dr. Roseanne Reno: [phone]  In the event of inclement weather, please call our main line at [phone] for an update on the status of any delays or closures.  Dermatology Medication Tips: Please keep the boxes that topical medications come in in order to help keep track of the instructions about where and how to use these. Pharmacies typically print the medication instructions only on the boxes and not directly on the medication tubes.   If your medication is too expensive, please contact our office at [phone] option 4 or send [place] a message through MyChart.   We are unable to tell what your co-pay for medications will be in advance as this is different depending on your insurance coverage. However, we may be able to find a substitute medication at lower cost or fill out paperwork to get insurance to cover a needed medication.   If a prior authorization is required to get your medication covered by your insurance company, please allow [place] 1-2 business days to complete this process.  Drug prices often vary depending on where the prescription is filled and some pharmacies may offer cheaper prices.  The website www.goodrx.com contains coupons for medications through different pharmacies. The prices here do not account for what the cost may be with help from insurance (it may be cheaper with  your insurance), but the website can give you the price if you did not use any insurance.  - You can print the associated coupon and take it with your prescription to the pharmacy.  - You may also stop by our office during regular business hours and pick up a GoodRx coupon card.  - If you need your prescription sent electronically to a different pharmacy, notify our office through [facility] or by phone at [phone]  option 4.

## 2023-03-12 NOTE — Progress Notes (Signed)
Follow-Up Visit   Subjective  Sara Suarez is a 47 y.o. female who presents for the following: atopic dermatitis. Patient currently taking Cibinqo 100 mg twice daily, using clobetasol/CeraVe mix, clobetasol, tacrolimus and Eucrisa topically. Patient still having flares at feet, right > left, hands improved. She picks at blisters so they can drain. Patient would like to reconsider Dupixent injections since they worked well. She did get injection site reactions on Dupixent in the past.  Patient never had any trouble with breathing while on Dupixent previously. At this point, she is willing to deal with the injection site reactions so her hands/feet will heal.  She got a generalized rash to Adbry. Pt has Epipens at home due to h/o of multiple allergies.   The following portions of the chart were reviewed this encounter and updated as appropriate: medications, allergies, medical history  Review of Systems:  No other skin or systemic complaints except as noted in HPI or Assessment and Plan.  Objective  Well appearing patient in no apparent distress; mood and affect are within normal limits.   A focused examination was performed of the following areas: Feet, hands  Relevant exam findings are noted in the Assessment and Plan.    Assessment & Plan   ATOPIC DERMATITIS- severe palmarplantar involvement Exam: multiple excoriations, hyperkeratosis at right plantar feet, web spaces, deep seated microvesicles/pustules at palms/feet   Chronic and persistent condition with duration or expected duration over one year. Condition is bothersome/symptomatic for patient. Currently flared.   Patient has contraindication to MTX due to daily alcohol ingestion and risk of liver toxicity, and has had injection site reaction to Dupixent and rash from Adbry injections. She has tried and failed 30 mg Rinvoq, and multiple topical steroids including clobetasol. Also tried and failed tacrolimus ointment and  Eucrisa ointment. She has had moderate improvement with double dose (200 mg) Cibinqo, but still very symptomatic.   Atopic dermatitis - Severe, on Jak inhibitor (biologic medication).  Atopic dermatitis (eczema) is a chronic, relapsing, pruritic condition that can significantly affect quality of life. It is often associated with allergic rhinitis and/or asthma and can require treatment with topical medications, phototherapy, or in severe cases biologic medications, which require long term medication management.    Treatment Plan: Discontinue Cibinqo. Continue CeraVe/Clobetasol cream twice daily to body areas as needed.  Continue Eucrisa, Tacrolimus, and Clobetasol as directed as needed to affected areas hands/feet for itching/rash.  Patient would like to restart Dupixent injections, pt understands risk of allergic reaction due to her prior h/o injection site reactions, including anaphylaxis.  She fully accepts risks and wants another trial of Dupixent.  She does have EpiPen.   Dupixent 150 mg injected into right thigh by patient and 150 mg right lower abdomen by MA. Patient tolerated well. Will not send rx in yet and wait to see how patient responds.  Samples given. Lot # [id number]   Exp: 02/2025  Dupilumab (Dupixent) is a treatment given by injection for adults and children with moderate-to-severe atopic dermatitis. Goal is control of skin condition, not cure. It is given as 2 injections at the first dose followed by 1 injection ever 2 weeks thereafter.  Young children are dosed monthly.  Potential side effects include allergic reaction, herpes infections, injection site reactions and conjunctivitis (inflammation of the eyes).  The use of Dupixent requires long term medication management, including periodic office visits.   Other atopic dermatitis  Related Medications tacrolimus (PROTOPIC) 0.1 % ointment Apply topically as directed.  Qd to bid to aa hands, right foot until clear, then prn  flares  dupilumab (DUPIXENT) prefilled syringe 300 mg    Return in about 2 weeks (around 03/26/2023).  Anise Salvo, RMA, am acting as scribe for Willeen Niece, MD .   Documentation: I have reviewed the above documentation for accuracy and completeness, and I agree with the above.  Willeen Niece, MD

## 2023-03-22 ENCOUNTER — Ambulatory Visit
Admission: RE | Admit: 2023-03-22 | Discharge: 2023-03-22 | Disposition: A | Discharge status: Home / Self Care | Payer: 59 | Source: Ambulatory Visit | Attending: Gerontology | Admitting: Gerontology

## 2023-03-22 DIAGNOSIS — Z1231 Encounter for screening mammogram for malignant neoplasm of breast: Secondary | ICD-10-CM | POA: Insufficient documentation

## 2023-03-26 ENCOUNTER — Ambulatory Visit: Payer: 59 | Admitting: Dermatology

## 2023-03-26 VITALS — BP 110/85 | HR 109

## 2023-03-26 DIAGNOSIS — Z79899 Other long term (current) drug therapy: Secondary | ICD-10-CM

## 2023-03-26 DIAGNOSIS — L2089 Other atopic dermatitis: Secondary | ICD-10-CM | POA: Diagnosis not present

## 2023-03-26 MED ORDER — DUPILUMAB 300 MG/2ML ~~LOC~~ SOPN
300.0000 mg | PEN_INJECTOR | Freq: Once | SUBCUTANEOUS | Status: AC
Start: 2023-03-26 — End: 2023-03-26
  Administered 2023-03-26: 300 mg via SUBCUTANEOUS

## 2023-03-26 NOTE — Patient Instructions (Addendum)
Dupilumab (Dupixent) is a treatment given by injection for adults and children with moderate-to-severe atopic dermatitis. Goal is control of skin condition, not cure. It is given as 2 injections at the first dose followed by 1 injection ever 2 weeks thereafter.  Young children are dosed monthly.  Potential side effects include allergic reaction, herpes infections, injection site reactions and conjunctivitis (inflammation of the eyes).  The use of Dupixent requires long term medication management, including periodic office visits.     Due to recent changes in healthcare laws, you may see results of your pathology and/or laboratory studies on MyChart before the doctors have had a chance to review them. We understand that in some cases there may be results that are confusing or concerning to you. Please understand that not all results are received at the same time and often the doctors may need to interpret multiple results in order to provide you with the best plan of care or course of treatment. Therefore, we ask that you please give us 2 business days to thoroughly review all your results before contacting the office for clarification. Should we see a critical lab result, you will be contacted sooner.   If You Need Anything After Your Visit  If you have any questions or concerns for your doctor, please call our main line at [phone] and press option 4 to reach your doctor's medical assistant. If no one answers, please leave a voicemail as directed and we will return your call as soon as possible. Messages left after 4 pm will be answered the following business day.   You may also send us a message via MyChart. We typically respond to MyChart messages within 1-2 business days.  For prescription refills, please ask your pharmacy to contact our office. Our fax number is [phone].  If you have an urgent issue when the clinic is closed that cannot wait until the next business day, you can page  your doctor at the number below.    Please note that while we do our best to be available for urgent issues outside of office hours, we are not available 24/7.   If you have an urgent issue and are unable to reach us, you may choose to seek medical care at your doctor's office, retail clinic, urgent care center, or emergency room.  If you have a medical emergency, please immediately call 911 or go to the emergency department.  Pager Numbers  - Dr. Kowalski: [phone]  - Dr. Moye: [phone]  - Dr. Stewart: [phone]  In the event of inclement weather, please call our main line at [phone] for an update on the status of any delays or closures.  Dermatology Medication Tips: Please keep the boxes that topical medications come in in order to help keep track of the instructions about where and how to use these. Pharmacies typically print the medication instructions only on the boxes and not directly on the medication tubes.   If your medication is too expensive, please contact our office at [phone] option 4 or send us a message through MyChart.   We are unable to tell what your co-pay for medications will be in advance as this is different depending on your insurance coverage. However, we may be able to find a substitute medication at lower cost or fill out paperwork to get insurance to cover a needed medication.   If a prior authorization is required to get your medication covered by your insurance company, please allow us 1-2 business days   to complete this process.  Drug prices often vary depending on where the prescription is filled and some pharmacies may offer cheaper prices.  The website www.goodrx.com contains coupons for medications through different pharmacies. The prices here do not account for what the cost may be with help from insurance (it may be cheaper with your insurance), but the website can give you the price if you did not use any insurance.  - You can  print the associated coupon and take it with your prescription to the pharmacy.  - You may also stop by our office during regular business hours and pick up a GoodRx coupon card.  - If you need your prescription sent electronically to a different pharmacy, notify our office through [facility] [facility] MyChart or by phone at [phone] option 4.     Si Usted Necesita Algo Despus de Su Visita  Tambin puede enviarnos un mensaje a travs de MyChart. Por lo general respondemos a los mensajes de MyChart en el transcurso de 1 a 2 das hbiles.  Para renovar recetas, por favor pida a su farmacia que se ponga en contacto con nuestra oficina. Nuestro nmero de fax es el [phone].  Si tiene un asunto urgente cuando la clnica est cerrada y que no puede esperar hasta el siguiente da hbil, puede llamar/localizar a su doctor(a) al nmero que aparece a continuacin.   Por favor, tenga en cuenta que aunque hacemos todo lo posible para estar disponibles para asuntos urgentes fuera del horario de oficina, no estamos disponibles las 24 horas del da, los 7 das de la semana.   Si tiene un problema urgente y no puede comunicarse con nosotros, puede optar por buscar atencin mdica  en el consultorio de su doctor(a), en una clnica privada, en un centro de atencin urgente o en una sala de emergencias.  Si tiene una emergencia mdica, por favor llame inmediatamente al 911 o vaya a la sala de emergencias.  Nmeros de bper  - Dr. Kowalski: [phone]  - Dra. Moye: [phone]  - Dra. Stewart: [phone]  En caso de inclemencias del tiempo, por favor llame a nuestra lnea principal al [phone] para una actualizacin sobre el estado de cualquier retraso o cierre.  Consejos para la medicacin en dermatologa: Por favor, guarde las cajas en las que vienen los medicamentos de uso tpico para ayudarle a seguir las instrucciones sobre dnde y cmo usarlos. Las farmacias generalmente imprimen las  instrucciones del medicamento slo en las cajas y no directamente en los tubos del medicamento.   Si su medicamento es muy caro, por favor, pngase en contacto con nuestra oficina llamando al [phone] y presione la opcin 4 o envenos un mensaje a travs de MyChart.   No podemos decirle cul ser su copago por los medicamentos por adelantado ya que esto es diferente dependiendo de la cobertura de su seguro. Sin embargo, es posible que podamos encontrar un medicamento sustituto a menor costo o llenar un formulario para que el seguro cubra el medicamento que se considera necesario.   Si se requiere una autorizacin previa para que su compaa de seguros cubra su medicamento, por favor permtanos de 1 a 2 das hbiles para completar este proceso.  Los precios de los medicamentos varan con frecuencia dependiendo del lugar de dnde se surte la receta y alguna farmacias pueden ofrecer precios ms baratos.  El sitio web www.goodrx.com tiene cupones para medicamentos de diferentes farmacias. Los precios aqu no tienen en cuenta lo que podra costar con la ayuda   del seguro (puede ser ms barato con su seguro), pero el sitio web puede darle el precio si no utiliz ningn seguro.  - Puede imprimir el cupn correspondiente y llevarlo con su receta a la farmacia.  - Tambin puede pasar por nuestra oficina durante el horario de atencin regular y recoger una tarjeta de cupones de GoodRx.  - Si necesita que su receta se enve electrnicamente a una farmacia diferente, informe a nuestra oficina a travs de MyChart de [place] o por telfono llamando al [phone] y presione la opcin 4.  

## 2023-03-26 NOTE — Progress Notes (Unsigned)
Follow-Up Visit   Subjective  Sara Suarez is a 46 y.o. female who presents for the following: Atopic Dermatitis 2 week dupixent follow up. Patient reports after restarting dupixent. Did have some itchiness but was able to control. Still flared at feet but doing a little better. Did not have any injection site reactions after having shot. Still using topical clobetasol and Tacrolimus.  Patient has contraindication to MTX due to daily alcohol ingestion and risk of liver toxicity, and has had injection site reaction to Dupixent in past and rash from Adbry injections. She has tried and failed 30 mg Rinvoq, and multiple topical steroids including clobetasol. Also tried and failed tacrolimus ointment and Eucrisa ointment. She has had moderate improvement with double dose (200 mg) Cibinqo, but never cleared.  The following portions of the chart were reviewed this encounter and updated as appropriate: medications, allergies, medical history  Review of Systems:  No other skin or systemic complaints except as noted in HPI or Assessment and Plan.  Objective  Well appearing patient in no apparent distress; mood and affect are within normal limits.  Areas Examined: B/l feet, b/l arms, back  Relevant physical exam findings are noted in the Assessment and Plan.    Assessment & Plan   Other atopic dermatitis  Related Medications tacrolimus (PROTOPIC) 0.1 % ointment Apply topically as directed. Qd to bid to aa hands, right foot until clear, then prn flares  Dupilumab SOPN 300 mg    ATOPIC DERMATITIS severe palmarplantar involvement Exam:  Dusky Erythema with hyperkeratosis at plantar feet and toes, distal foot dorsum. Scattered excoriated papules on arms  Chronic and persistent condition with duration or expected duration over one year. Condition is symptomatic/ bothersome to patient. Not currently at goal. Some improvement with restart of Dupixent samples without severe adverse  reactions.   Atopic dermatitis - Severe, on Jak inhibitor (biologic medication).  Atopic dermatitis (eczema) is a chronic, relapsing, pruritic condition that can significantly affect quality of life. It is often associated with allergic rhinitis and/or asthma and can require treatment with topical medications, phototherapy, or in severe cases biologic medications, which require long term medication management.    Treatment Plan:  Continue CeraVe/Clobetasol cream twice daily to body areas as needed.  Continue Eucrisa, Tacrolimus, and Clobetasol as directed as needed to affected areas hands/feet for itching/rash. Will continue Dupixent injections 300 mg injection every 2 weeks   Sample Dupixent 300 mg pen injected into left thigh by cma.  Patient tolerated well. Lot # [id number]   Exp: 12/13/2024  Will follow up in 2 weeks and next sample of dupixent.  Will continue to hold sending rx to [facility], will wait to see if any adverse reactions at next follow up.      Dupilumab (Dupixent) is a treatment given by injection for adults and children with moderate-to-severe atopic dermatitis. Goal is control of skin condition, not cure. It is given as 2 injections at the first dose followed by 1 injection ever 2 weeks thereafter.  Young children are dosed monthly.   Potential side effects include allergic reaction, herpes infections, injection site reactions and conjunctivitis (inflammation of the eyes).  The use of Dupixent requires long term medication management, including periodic office visits.   Recommend gentle skin care.   Return for 2 week dupixent  with Dr. Roseanne Reno  follow up.  I, Asher Muir, CMA, am acting as scribe for Willeen Niece, MD.   Documentation: I have reviewed the above documentation for accuracy and  completeness, and I agree with the above.  Brendolyn Patty, MD

## 2023-04-11 ENCOUNTER — Ambulatory Visit: Payer: 59 | Admitting: Dermatology

## 2023-04-11 ENCOUNTER — Encounter: Payer: Self-pay | Admitting: Dermatology

## 2023-04-11 DIAGNOSIS — L2089 Other atopic dermatitis: Secondary | ICD-10-CM

## 2023-04-11 DIAGNOSIS — L209 Atopic dermatitis, unspecified: Secondary | ICD-10-CM | POA: Diagnosis not present

## 2023-04-11 NOTE — Patient Instructions (Addendum)
Recommend taking Hydroxyzine 50 mg twice daily 2-3 days prior to Dupixent injections. Do not take if taking Benadryl.   Take 1 when you get home today and one before bedtime.   Continue Cetirizine as directed.   Inject Dupixent sample into right thigh today.    Continue CeraVe/Clobetasol cream twice daily to body areas as needed.  Continue Eucrisa, Tacrolimus, and Clobetasol as directed as needed to affected areas hands/feet for itching/rash. Will continue Dupixent injections 300 mg injection every 2 weeks  Dupilumab (Dupixent) is a treatment given by injection for adults and children with moderate-to-severe atopic dermatitis. Goal is control of skin condition, not cure. It is given as 2 injections at the first dose followed by 1 injection ever 2 weeks thereafter.  Young children are dosed monthly.  Potential side effects include allergic reaction, herpes infections, injection site reactions and conjunctivitis (inflammation of the eyes).  The use of Dupixent requires long term medication management, including periodic office visits.    Topical steroids (such as triamcinolone, fluocinolone, fluocinonide, mometasone, clobetasol, halobetasol, betamethasone, hydrocortisone) can cause thinning and lightening of the skin if they are used for too long in the same area. Your physician has selected the right strength medicine for your problem and area affected on the body. Please use your medication only as directed by your physician to prevent side effects.      Recommend OTC Gold Bond Rapid Relief Anti-Itch cream (pramoxine + menthol), CeraVe Anti-itch cream or lotion (pramoxine), Sarna lotion (Original- menthol + camphor or Sensitive- pramoxine) or Eucerin 12 hour Itch Relief lotion (menthol) up to 3 times per day to areas on body that are itchy.

## 2023-04-11 NOTE — Progress Notes (Signed)
Follow-Up Visit   Subjective  Sara Suarez is a 47 y.o. female who presents for the following: Atopic Dermatitis 2 week dupixent follow up. Loading dose started 03/12/2023.  After injection 2 weeks ago began itching all over. Did not have visible rash like with previous Dupixent therapy or Adbry. Used topical creams and took Benadryl. Thinks if did another injection today will make her itch miserably. Wonders if oral prednisone Rx would counteract that reaction? The blisters on feet have decreased since re-starting Dupixent.   Still using topical clobetasol and Tacrolimus. Denies hx of psoriasis.  Takes Hydroxyzine 50 mg capsule twice daily as needed for anxiety, but hasn't been taking lately.   The following portions of the chart were reviewed this encounter and updated as appropriate: medications, allergies, medical history  Review of Systems:  No other skin or systemic complaints except as noted in HPI or Assessment and Plan.  Objective  Well appearing patient in no apparent distress; mood and affect are within normal limits.  Areas Examined: B/l feet, b/l arms, back  Relevant physical exam findings are noted in the Assessment and Plan.           Assessment & Plan     ATOPIC DERMATITIS severe palmarplantar involvement, improving on foot, but increasing itching on body on Dupixent Exam:  Hyperkeratosis at palms, erythema with hyperkeratosis and scale at right foot.  Chronic and persistent condition with duration or expected duration over one year. Condition is symptomatic/ bothersome to patient. Not currently at goal. Some improvement with restart of Dupixent samples without severe adverse reactions, but has increased itching on body.   Atopic dermatitis - Severe, on Dupixent (biologic medication).  Atopic dermatitis (eczema) is a chronic, relapsing, pruritic condition that can significantly affect quality of life. It is often associated with allergic rhinitis  and/or asthma and can require treatment with topical medications, phototherapy, or in severe cases biologic medications, which require long term medication management.    Treatment Plan:  Continue CeraVe/Clobetasol cream twice daily to body areas as needed.  Continue Eucrisa, Tacrolimus, and Clobetasol as directed as needed to affected areas hands/feet for itching/rash. Will continue Dupixent injections 300 mg injection every 2 weeks  Recommend taking Hydroxyzine 50 mg twice daily starting prior to Dupixent injections. Do not take if taking Benadryl.  Continue Cetirizine 10 mg daily as directed. Patient will call for oral prednisone if itching is not controlled by antihistamines    Sample Dupixent 300 mg pen given to take home and use after taking first dose of Hydroxyzine.  Lot # [id number]   Exp: 12/13/2024  Will follow up in 2 weeks and next sample of dupixent.  Will continue to hold sending rx to [facility], will wait to see if any adverse reactions at next follow up.    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    Dupilumab (Dupixent) is a treatment given by injection for adults and children with moderate-to-severe atopic dermatitis. Goal is control of skin condition, not cure. It is given as 2 injections at the first dose followed by 1 injection ever 2 weeks thereafter.  Young children are dosed monthly.   Potential side effects include allergic reaction, herpes infections, injection site reactions and conjunctivitis (inflammation of the eyes).  The use of Dupixent requires long term medication management, including periodic office visits.   Recommend gentle skin care.  Patient has contraindication to MTX due to daily  alcohol ingestion and risk of liver toxicity, and has had injection site reaction to Dupixent in past and rash from Adbry injections. She has tried and failed 30 mg Rinvoq, and  multiple topical steroids including clobetasol. Also tried and failed tacrolimus ointment and Eucrisa ointment. She has had moderate improvement with double dose (200 mg) Cibinqo, but never cleared.  Return in about 2 weeks (around 04/25/2023) for Atopic Dermatitis Follow Up, Dupixent Follow Up.  I, Lawson Radar, CMA, am acting as scribe for Willeen Niece, MD.    Documentation: I have reviewed the above documentation for accuracy and completeness, and I agree with the above.  Willeen Niece, MD

## 2023-04-25 ENCOUNTER — Ambulatory Visit: Payer: 59 | Admitting: Dermatology

## 2023-04-25 VITALS — BP 125/77 | HR 87

## 2023-04-25 DIAGNOSIS — L299 Pruritus, unspecified: Secondary | ICD-10-CM

## 2023-04-25 DIAGNOSIS — L209 Atopic dermatitis, unspecified: Secondary | ICD-10-CM | POA: Diagnosis not present

## 2023-04-25 DIAGNOSIS — Z79899 Other long term (current) drug therapy: Secondary | ICD-10-CM

## 2023-04-25 MED ORDER — CICLOPIROX OLAMINE 0.77 % EX CREA: TOPICAL_CREAM | CUTANEOUS | 0 refills | Status: AC

## 2023-04-25 MED ORDER — DUPILUMAB 300 MG/2ML ~~LOC~~ SOPN
300.0000 mg | PEN_INJECTOR | Freq: Once | SUBCUTANEOUS | Status: AC
Start: 2023-04-25 — End: 2023-04-25
  Administered 2023-04-25: 300 mg via SUBCUTANEOUS

## 2023-04-25 MED ORDER — DUPIXENT 300 MG/2ML ~~LOC~~ SOAJ: 300.0000 mg | SUBCUTANEOUS | 5 refills | Status: DC

## 2023-04-25 NOTE — Progress Notes (Signed)
Follow-Up Visit   Subjective  Sara Suarez is a 47 y.o. female who presents for the following: Atopic Dermatitis - currently taking Dupixent 300 mg/70mL SQ Q2W. She did experience generalized all over itching after her last Dupixent injection but no rash. Hydroxyzine BID and OTC lotions/cream have helped, but Hydroxyzine can make her sleepy. She sees definite improvement in her foot, and says she can live with the itching and wants to continue Dupixent.  No injection site reactions noticed.  The following portions of the chart were reviewed this encounter and updated as appropriate: medications, allergies, medical history  Review of Systems:  No other skin or systemic complaints except as noted in HPI or Assessment and Plan.  Objective  Well appearing patient in no apparent distress; mood and affect are within normal limits.  Areas Examined: The face, arms, feet, and hands  Relevant physical exam findings are noted in the Assessment and Plan.    Assessment & Plan   ATOPIC DERMATITIS - severe palmarplantar involvement, improving on foot, but increasing itching on body on Dupixent   Exam: Erythema with exfoliant scale on the R distal foot, toes, and plantar foot. Also mild scaling on hands  4% BSA  Chronic and persistent condition with duration or expected duration over one year. Condition is symptomatic/ bothersome to patient. Not currently at goal.  Pt requests staying on Dupixent for now, despite increase itching (no rash) after injections, since foot is improving.  Atopic dermatitis (eczema) is a chronic, relapsing, pruritic condition that can significantly affect quality of life. It is often associated with allergic rhinitis and/or asthma and can require treatment with topical medications, phototherapy, or in severe cases biologic injectable medication (Dupixent; Adbry) or Oral JAK inhibitors.  Atopic dermatitis - Severe, on Dupixent (biologic medication).  Atopic  dermatitis (eczema) is a chronic, relapsing, pruritic condition that can significantly affect quality of life. It is often associated with allergic rhinitis and/or asthma and can require treatment with topical medications, phototherapy, or in severe cases biologic medications, which require long term medication management.   Treatment Plan:  Continue CeraVe/Clobetasol cream twice daily to body areas as needed for itch   Continue Eucrisa, Tacrolimus, and Clobetasol as directed as needed to affected areas hands/feet for itching/rash.  Will continue Dupixent injections 300 mg injection every 2 weeks. Educated patient on how to self inject. She demonstrated comprehension and is comfortable injecting at home if insurance approves medication. Dupixent 300mg /39mL injected SQ into the LLQA by patient. She tolerated the injection well.   Dupilumab (Dupixent) is a treatment given by injection for adults and children with moderate-to-severe atopic dermatitis. Goal is control of skin condition, not cure. It is given as 2 injections at the first dose followed by 1 injection ever 2 weeks thereafter.  Young children are dosed monthly.  Potential side effects include allergic reaction, herpes infections, injection site reactions and conjunctivitis (inflammation of the eyes).  The use of Dupixent requires long term medication management, including periodic office visits.   Recommend gentle skin care.  Recommend taking Hydroxyzine 50 mg twice daily starting prior to Dupixent injections.   Do not take if taking Benadryl.   Continue Cetirizine 10 mg daily as directed.  Start ciclopirox cream to feet and between the toes BID x 4 weeks.   Patient will call for oral prednisone if itching is not controlled by antihistamines  Recommend OTC Foot Amlactin cream to the feet to help slough skin. Do not pick or peel skin at  the feet as this can worsen condition.    Patient has contraindication to MTX due to daily  alcohol ingestion and risk of liver toxicity, and has had injection site reaction to Dupixent in past and rash from Adbry injections. She has tried and failed 30 mg Rinvoq, and multiple topical steroids including clobetasol. Also tried and failed tacrolimus ointment and Eucrisa ointment. She has had moderate improvement with double dose (200 mg) Cibinqo, but never cleared.  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   Return for follow up in 4-6 weeks atopic dermatitis/Dupixent .  Maylene Roes, CMA, am acting as scribe for Willeen Niece, MD .  Documentation: I have reviewed the above documentation for accuracy and completeness, and I agree with the above.  Willeen Niece, MD

## 2023-04-25 NOTE — Patient Instructions (Addendum)
Gentle Skin Care Guide  1. Bathe no more than once a day.  2. Avoid bathing in hot water  3. Use a mild soap like Dove, Vanicream, Cetaphil, CeraVe. Can use Lever 2000 or Cetaphil antibacterial soap  4. Use soap only where you need it. On most days, use it under your arms, between your legs, and on your feet. Let the water rinse other areas unless visibly dirty.  5. When you get out of the bath/shower, use a towel to gently blot your skin dry, don't rub it.  6. While your skin is still a little damp, apply a moisturizing cream such as Vanicream, CeraVe, Cetaphil, Eucerin, Sarna lotion or plain Vaseline Jelly. For hands apply Neutrogena Norwegian Hand Cream or Excipial Hand Cream.  7. Reapply moisturizer any time you start to itch or feel dry.  8. Sometimes using free and clear laundry detergents can be helpful. Fabric softener sheets should be avoided. Downy Free & Gentle liquid, or any liquid fabric softener that is free of dyes and perfumes, it acceptable to use  9. If your doctor has given you prescription creams you may apply moisturizers over them      Due to recent changes in healthcare laws, you may see results of your pathology and/or laboratory studies on MyChart before the doctors have had a chance to review them. We understand that in some cases there may be results that are confusing or concerning to you. Please understand that not all results are received at the same time and often the doctors may need to interpret multiple results in order to provide you with the best plan of care or course of treatment. Therefore, we ask that you please give us 2 business days to thoroughly review all your results before contacting the office for clarification. Should we see a critical lab result, you will be contacted sooner.   If You Need Anything After Your Visit  If you have any questions or concerns for your doctor, please call our main line at [phone] and press option 4 to reach  your doctor's medical assistant. If no one answers, please leave a voicemail as directed and we will return your call as soon as possible. Messages left after 4 pm will be answered the following business day.   You may also send us a message via MyChart. We typically respond to MyChart messages within 1-2 business days.  For prescription refills, please ask your pharmacy to contact our office. Our fax number is [phone].  If you have an urgent issue when the clinic is closed that cannot wait until the next business day, you can page your doctor at the number below.    Please note that while we do our best to be available for urgent issues outside of office hours, we are not available 24/7.   If you have an urgent issue and are unable to reach us, you may choose to seek medical care at your doctor's office, retail clinic, urgent care center, or emergency room.  If you have a medical emergency, please immediately call 911 or go to the emergency department.  Pager Numbers  - Dr. Kowalski: [phone]  - Dr. Moye: [phone]  - Dr. Stewart: [phone]  In the event of inclement weather, please call our main line at [phone] for an update on the status of any delays or closures.  Dermatology Medication Tips: Please keep the boxes that topical medications come in in order to help keep track of the instructions about   where and how to use these. Pharmacies typically print the medication instructions only on the boxes and not directly on the medication tubes.   If your medication is too expensive, please contact our office at [phone] option 4 or send us a message through MyChart.   We are unable to tell what your co-pay for medications will be in advance as this is different depending on your insurance coverage. However, we may be able to find a substitute medication at lower cost or fill out paperwork to get insurance to cover a needed medication.   If a prior authorization  is required to get your medication covered by your insurance company, please allow us 1-2 business days to complete this process.  Drug prices often vary depending on where the prescription is filled and some pharmacies may offer cheaper prices.  The website www.goodrx.com contains coupons for medications through different pharmacies. The prices here do not account for what the cost may be with help from insurance (it may be cheaper with your insurance), but the website can give you the price if you did not use any insurance.  - You can print the associated coupon and take it with your prescription to the pharmacy.  - You may also stop by our office during regular business hours and pick up a GoodRx coupon card.  - If you need your prescription sent electronically to a different pharmacy, notify our office through [facility] MyChart or by phone at [phone] option 4.     Si Usted Necesita Algo Despus de Su Visita  Tambin puede enviarnos un mensaje a travs de MyChart. Por lo general respondemos a los mensajes de MyChart en el transcurso de 1 a 2 das hbiles.  Para renovar recetas, por favor pida a su farmacia que se ponga en contacto con nuestra oficina. Nuestro nmero de fax es el [phone].  Si tiene un asunto urgente cuando la clnica est cerrada y que no puede esperar hasta el siguiente da hbil, puede llamar/localizar a su doctor(a) al nmero que aparece a continuacin.   Por favor, tenga en cuenta que aunque hacemos todo lo posible para estar disponibles para asuntos urgentes fuera del horario de oficina, no estamos disponibles las 24 horas del da, los 7 das de la semana.   Si tiene un problema urgente y no puede comunicarse con nosotros, puede optar por buscar atencin mdica  en el consultorio de su doctor(a), en una clnica privada, en un centro de atencin urgente o en una sala de emergencias.  Si tiene una emergencia mdica, por favor llame inmediatamente al 911 o  vaya a la sala de emergencias.  Nmeros de bper  - Dr. Kowalski: [phone]  - Dra. Moye: [phone]  - Dra. Stewart: [phone]  En caso de inclemencias del tiempo, por favor llame a nuestra lnea principal al [phone] para una actualizacin sobre el estado de cualquier retraso o cierre.  Consejos para la medicacin en dermatologa: Por favor, guarde las cajas en las que vienen los medicamentos de uso tpico para ayudarle a seguir las instrucciones sobre dnde y cmo usarlos. Las farmacias generalmente imprimen las instrucciones del medicamento slo en las cajas y no directamente en los tubos del medicamento.   Si su medicamento es muy caro, por favor, pngase en contacto con nuestra oficina llamando al [phone] y presione la opcin 4 o envenos un mensaje a travs de MyChart.   No podemos decirle cul ser su copago por los medicamentos por adelantado ya que esto   es diferente dependiendo de la cobertura de su seguro. Sin embargo, es posible que podamos encontrar un medicamento sustituto a menor costo o llenar un formulario para que el seguro cubra el medicamento que se considera necesario.   Si se requiere una autorizacin previa para que su compaa de seguros cubra su medicamento, por favor permtanos de 1 a 2 das hbiles para completar este proceso.  Los precios de los medicamentos varan con frecuencia dependiendo del lugar de dnde se surte la receta y alguna farmacias pueden ofrecer precios ms baratos.  El sitio web www.goodrx.com tiene cupones para medicamentos de diferentes farmacias. Los precios aqu no tienen en cuenta lo que podra costar con la ayuda del seguro (puede ser ms barato con su seguro), pero el sitio web puede darle el precio si no utiliz ningn seguro.  - Puede imprimir el cupn correspondiente y llevarlo con su receta a la farmacia.  - Tambin puede pasar por nuestra oficina durante el horario de atencin regular y recoger una tarjeta de cupones  de GoodRx.  - Si necesita que su receta se enve electrnicamente a una farmacia diferente, informe a nuestra oficina a travs de MyChart de [place] o por telfono llamando al [phone] y presione la opcin 4.  

## 2023-04-26 ENCOUNTER — Ambulatory Visit: Payer: 59 | Admitting: Psychiatry

## 2023-04-26 ENCOUNTER — Encounter: Payer: Self-pay | Admitting: Psychiatry

## 2023-04-26 VITALS — BP 132/80 | HR 101 | Temp 97.6°F | Ht 64.0 in | Wt 218.8 lb

## 2023-04-26 DIAGNOSIS — F908 Attention-deficit hyperactivity disorder, other type: Secondary | ICD-10-CM | POA: Diagnosis not present

## 2023-04-26 DIAGNOSIS — F411 Generalized anxiety disorder: Secondary | ICD-10-CM

## 2023-04-26 DIAGNOSIS — F5105 Insomnia due to other mental disorder: Secondary | ICD-10-CM | POA: Diagnosis not present

## 2023-04-26 MED ORDER — DULOXETINE HCL 20 MG PO CPEP
40.0000 mg | ORAL_CAPSULE | Freq: Every day | ORAL | 0 refills | Status: DC
Start: 2023-04-26 — End: 2023-07-25

## 2023-04-26 NOTE — Patient Instructions (Addendum)
[facility]- for adhd   Stevens-Johnson Syndrome Stevens-Johnson syndrome is a rare disorder of the mucous membranes and skin. The syndrome tends to progress through several stages: The mucous membranes become inflamed. The top layer of skin dies and starts to shed. The more skin that dies, the more serious the disorder becomes. The body loses fluids quickly. The body loses its ability to keep germs out. This condition requires immediate treatment in the hospital to prevent complications such as: Too much fluid loss. Blood infection. Eye damage. Skin damage and infection. Vision loss, if the eyes are affected. Damage to the lungs, heart, kidneys, or liver. What are the causes? The most common cause of this condition is an allergic reaction to a medicine. Medicines that are known to cause this condition include: Antibiotic medicines. Antiseizure medicines. Medicine that is used to treat gout. NSAIDs, such as ibuprofen. This condition can also be caused by an infection. In some cases, the cause may not be known. What increases the risk? You are more likely to develop this condition if you have: A variation in the human leukocyte antigen (HLA) gene. This variation may be passed down through families (inherited). A family history of Stevens-Johnson syndrome. Cancer, or you are having cancer treatment. A weak body defense system (immune system). Systemic lupus erythematosus. What are the signs or symptoms? This condition often begins with several days of flu-like symptoms. Symptoms of this condition include: Fever. Sore throat. Dry cough. Headache. Muscle aches. Fatigue. Burning feeling in the eyes. A painful red or purple rash may develop on the face, trunk, palms, or soles, and spread to other parts of the body. The rash creates blisters and open sores on the skin. If the mucous membranes are affected, the rash may be in the: Mouth, nose, and eyes. Digestive tract. Urinary  tract. Genitals. Other signs and symptoms include: Shedding of the skin or mucous membranes. Itchy, red, swollen areas of skin (hives). Redness, sensitivity to light, and dryness in the eyes. Swelling of the tongue and face. Pain in the mouth and throat, and pain when swallowing. Pain when urinating. How is this diagnosed? This condition is diagnosed based on: A physical exam. Tests, such as: A biopsy. This involves removing a sample of skin or eye tissue to be looked at under a microscope. Blood tests. Imaging tests. If you are having any eye symptoms, you may need to be seen by an eye specialist (ophthalmologist). How is this treated? This condition may be treated by: Stopping medicines that may be causing symptoms. Getting fluids and nourishment through an IV or through a tube that is passed through your nose and into your stomach (nasogastric tube,or NG tube). Gently removing dead skin and putting a moist bandage (dressing) with medicines on those areas. Applying eye drops or having eye surgery. Using a mouthwash that numbs the mouth and throat to help with swallowing. Taking medicines to: Help you relax (sedatives). Control your pain. Fight infection (antibiotics). Stop skin swelling and itching. Follow these instructions at home: Medicines Take over-the-counter and prescription medicines only as told by your health care provider. If you were prescribed an antibiotic medicine, take it as told by your health care provider. Do not stop using the antibiotic even if you start to feel better. If a medicine triggered your condition, talk with your health care provider before you take the medicine again. Do not take it if your health care provider tells you not to. Do not start taking any new medicines before you  ask your health care provider if they are safe for you. 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 If the  condition was caused by a medicine, always tell your health care providers which medicine caused it. Follow instructions from your health care provider about: How to clean and take care of your skin. When to change and remove dressings. How to protect your skin from the sun. Wear a medical bracelet or necklace that says that you had Stevens-Johnson syndrome and what caused it. For example, add the name of the medicine if that is the cause of your condition. Ask your health care provider if you should be tested for the HLA gene. Consider talking with a mental health therapist if you feel overwhelmed. Managing this condition can be challenging and you may have feelings of depression, anxiety, or fear. Keep all follow-up visits. This is important. Where to find support Stevens-Johnson Syndrome Foundation: sjsupport.org Contact a health care provider if: You have trouble managing complications of the condition. Get help right away if: You have flu-like symptoms after you have an infection or after you start a new medicine. You develop symptoms on your skin or mucous membranes again. These symptoms may represent a serious problem that is an emergency. Do not wait to see if the symptoms will go away. Get medical help right away. Call your local emergency services (911 in the [place] Do not drive yourself to the hospital. Summary Stevens-Johnson syndrome is a disorder of the mucous membranes and skin. This condition requires immediate treatment in the hospital to prevent complications. If a medicine triggered your condition, talk with your health care provider before you take the medicine again. Do not take it if your health care provider tells you not to. Wear a medical bracelet or necklace that says that you had this condition and what caused it. Get help right away if you have flu-like symptoms after you have an infection or after you start a new medicine. This information is not intended to  replace advice given to you by your health care provider. Make sure you discuss any questions you have with your health care provider. Document Revised: 03/18/2021 Document Reviewed: 03/18/2021 Elsevier Patient Education  2024 [employer].

## 2023-04-26 NOTE — Progress Notes (Signed)
BH MD OP Progress Note  04/26/2023 11:37 AM Sara Suarez  MRN:  [id number]  Chief Complaint:  Chief Complaint  Patient presents with   Follow-up   Depression   Anxiety   Medication Refill   HPI: Sara Suarez is a 47 year old Caucasian female, lives in [place], has a history of GAD, insomnia, ADHD, psoriasis, atopic dermatitis, was evaluated in office today.  Patient today reports she recently had dermatological problems,, recently was seen by dermatologist, tried multiple medications.  Patient reports she developed side effects to Dupixent.  She reports she however was able to get another injection of Dupixent, and also was prescribed multiple other medications.  Her skin rash is currently getting better.  She continues to have upcoming appointment with dermatology.  Patient reports she struggles with her sleep hygiene and sleep habits.  That does have an impact on her when she wakes up in the morning, affects her concentration, energy level.  She wants to work on her sleep hygiene and maybe start taking the Ambien more regularly.  She is not interested in medication changes at this time.  Patient does have situational anxiety although managing it okay.  Currently compliant on the duloxetine.  Does not believe skin problems are from the duloxetine since she has been on it for several years now.  However agrees to discuss with dermatologist.  She also has hydroxyzine available as needed.  That does help with her anxiety.  Patient denies any suicidality, homicidality or perceptual disturbances.  Patient reports she was able to take a trip to [place] and enjoyed it well.  Patient is interested in changing her Vyvanse to another medication since she is tired of taking multiple dosages throughout the day.  She will have that discussion with [place] attention specialist provider.  Patient denies any other concerns today.  Visit Diagnosis:    ICD-10-CM   1. Generalized anxiety  disorder  F41.1 DULoxetine (CYMBALTA) 20 MG capsule    2. Insomnia due to mental condition  F51.05    mood    3. Attention deficit hyperactivity disorder (ADHD), other type  F90.8       Past Psychiatric History: I have reviewed past psychiatric history from progress note on 07/09/2019.  Past trials of Cymbalta, Klonopin, Ambien.  Past Medical History:  Past Medical History:  Diagnosis Date   ADHD (attention deficit hyperactivity disorder)    Allergy    Anxiety    Depression     Past Surgical History:  Procedure Laterality Date   BREAST CYST ASPIRATION Right    neg   ENDOSCOPIC PLANTAR FASCIOTOMY     FOOT SURGERY     MOUTH SURGERY     TUBAL LIGATION      Family Psychiatric History: Reviewed family psychiatric history from progress note on 07/09/2019.  Family History:  Family History  Problem Relation Age of Onset   Breast cancer Mother 43   Anxiety disorder Mother    Breast cancer Maternal Aunt 63   Anxiety disorder Sister    Panic disorder Sister    Dementia Maternal Grandmother    Dementia Paternal Grandmother     Social History: Reviewed social history from progress note on 07/09/2019. Social History   Socioeconomic History   Marital status: Married    Spouse name: jon   Number of children: 0   Years of education: Not on file   Highest education level: Bachelor's degree (e.g., BA, AB, BS)  Occupational History   Not on file  Tobacco Use   Smoking status: Never   Smokeless tobacco: Never  Vaping Use   Vaping Use: Never used  Substance and Sexual Activity   Alcohol use: Never   Drug use: Never   Sexual activity: Not on file  Other Topics Concern   Not on file  Social History Narrative   Not on file   Social Determinants of Health   Financial Resource Strain: Low Risk  (07/09/2019)   Overall Financial Resource Strain (CARDIA)    Difficulty of Paying Living Expenses: Not hard at all  Food Insecurity: No Food Insecurity (07/09/2019)   Hunger Vital  Sign    Worried About Running Out of Food in the Last Year: Never true    Ran Out of Food in the Last Year: Never true  Transportation Needs: No Transportation Needs (07/09/2019)   PRAPARE - Administrator, Civil Service (Medical): No    Lack of Transportation (Non-Medical): No  Physical Activity: Inactive (07/09/2019)   Exercise Vital Sign    Days of Exercise per Week: 0 days    Minutes of Exercise per Session: 0 min  Stress: Stress Concern Present (07/09/2019)   [employer] of Occupational Health - Occupational Stress Questionnaire    Feeling of Stress : Rather much  Social Connections: Unknown (07/09/2019)   Social Connection and Isolation Panel [NHANES]    Frequency of Communication with Friends and Family: Not on file    Frequency of Social Gatherings with Friends and Family: Not on file    Attends Religious Services: Never    Active Member of Clubs or Organizations: Yes    Attends Banker Meetings: More than 4 times per year    Marital Status: Married    Allergies:  Allergies  Allergen Reactions   Adbry [Tralokinumab-Ldrm] Rash   Atomoxetine Other (See Comments)    Heartburn   Guanfacine Other (See Comments)    drowsiness   Chlorpheniramine-Pseudoeph     Hyperactive   Dupilumab Other (See Comments)    Injection reaction but tolerable per patient   Pseudoephedrine Other (See Comments)    Hyperactive Other reaction(s): insomnia, shakey   Shellfish Allergy Other (See Comments)   Hydrocodone Itching   Prednisone Anxiety    insomnia    Metabolic Disorder Labs: No results found for: "HGBA1C", "MPG" No results found for: "PROLACTIN" Lab Results  Component Value Date   CHOL 173 02/06/2023   TRIG 94 02/06/2023   HDL 48 02/06/2023   CHOLHDL 3.6 02/06/2023   LDLCALC 108 (H) 02/06/2023   No results found for: "TSH"  Therapeutic Level Labs: No results found for: "LITHIUM" No results found for: "VALPROATE" No results found for:  "CBMZ"  Current Medications: Current Outpatient Medications  Medication Sig Dispense Refill   albuterol (VENTOLIN HFA) 108 (90 Base) MCG/ACT inhaler Inhale 1-2 puffs into the lungs every 4 (four) hours as needed for wheezing or shortness of breath. 1 each 0   azelastine (ASTELIN) 0.1 % nasal spray azelastine 137 mcg (0.1 %) nasal spray aerosol     ciclopirox (LOPROX) 0.77 % cream Apply to the feet and between the toes BID x 4 weeks. 90 g 0   clobetasol (TEMOVATE) 0.05 % external solution Mix with CeraVe cream and use as directed. Avoid applying to face, groin, and axilla. 50 mL 2   clobetasol cream (TEMOVATE) 0.05 % Apply to affected areas rash on hands and foot twice daily for flares until improved. Avoid face, groin, underarms. 60 g 1  Crisaborole (EUCRISA) 2 % OINT APPLY TO AFFECTED AREAS HANDS AND FEET 1-2 TIMES A DAY FOR MAINTENANCE. 60 g 2   Diclofenac Sodium (PENNSAID) 2 % SOLN      Dupilumab (DUPIXENT) 300 MG/2ML SOPN Inject 300 mg into the skin every 14 (fourteen) days. Starting at day 15 for maintenance. 4 mL 5   EPINEPHrine 0.3 mg/0.3 mL IJ SOAJ injection See admin instructions.     gabapentin (NEURONTIN) 300 MG capsule Take by mouth.     hydrOXYzine (VISTARIL) 50 MG capsule TAKE 1 CAPSULE (50 MG TOTAL) BY MOUTH 2 (TWO) TIMES DAILY AS NEEDED. 180 capsule 1   ibuprofen (ADVIL) 600 MG tablet Take 1 tablet (600 mg total) by mouth every 6 (six) hours as needed. 30 tablet 0   losartan (COZAAR) 25 MG tablet Take 1 tablet by mouth daily.     mupirocin ointment (BACTROBAN) 2 % Apply 1 Application topically daily. 22 g 0   Na Sulfate-K Sulfate-Mg Sulf 17.5-3.13-1.6 GM/177ML SOLN Take 1 Bottle by mouth as directed.     Spacer/Aero-Holding Chambers (AEROCHAMBER MV) inhaler Use as instructed 1 each 1   spironolactone (ALDACTONE) 50 MG tablet Take 1-2 tablets by mouth every day for acne. 60 tablet 2   SUMAtriptan (IMITREX) 50 MG tablet Take by mouth.     tacrolimus (PROTOPIC) 0.1 % ointment  Apply topically as directed. Qd to bid to aa hands, right foot until clear, then prn flares 100 g 2   triamcinolone (NASACORT) 55 MCG/ACT AERO nasal inhaler Nasal Allergy 55 mcg spray aerosol  2 SPRAYS ONCE A DAY NASALLY 30 DAYS     VITAMIN D PO Take by mouth.     VYVANSE 40 MG CHEW Chew 1 tablet by mouth daily.     zolpidem (AMBIEN) 5 MG tablet Take 1-1.5 tablets (5-7.5 mg total) by mouth at bedtime as needed for sleep. 45 tablet 1   Abrocitinib (CIBINQO) 100 MG TABS Take 1 tablet by mouth daily. (Patient not taking: Reported on 04/26/2023) 30 tablet 2   DULoxetine (CYMBALTA) 20 MG capsule Take 2 capsules (40 mg total) by mouth daily. Stop Duloxetine 60 mg 180 capsule 0   No current facility-administered medications for this visit.     Musculoskeletal: Strength & Muscle Tone: within normal limits Gait & Station: normal Patient leans: N/A  Psychiatric Specialty Exam: Review of Systems  Psychiatric/Behavioral: Negative.      Blood pressure 132/80, pulse (!) 101, temperature 97.6 F (36.4 C), temperature source Skin, height 5\' 4"  (1.626 m), weight 218 lb 12.8 oz (99.2 kg).Body mass index is 37.56 kg/m.  General Appearance: Casual  Eye Contact:  Fair  Speech:  Clear and Coherent  Volume:  Normal  Mood:  Euthymic  Affect:  Appropriate  Thought Process:  Goal Directed and Descriptions of Associations: Intact  Orientation:  Full (Time, Place, and Person)  Thought Content: Logical   Suicidal Thoughts:  No  Homicidal Thoughts:  No  Memory:  Immediate;   Fair Recent;   Fair Remote;   Fair  Judgement:  Fair  Insight:  Fair  Psychomotor Activity:  Normal  Concentration:  Concentration: Fair and Attention Span: Fair  Recall:  [employer] of Knowledge: Fair  Language: Good  Akathisia:  No  Handed:  Right  AIMS (if indicated): not done  Assets:  Communication Skills Desire for Improvement Housing Social Support  ADL's:  Intact  Cognition: WNL  Sleep:  Fair   Screenings: AIMS     Flowsheet  Row Video Visit from 06/19/2022 in [facility]  AIMS Total Score 0      GAD-7    Flowsheet Row Office Visit from 04/26/2023 in [facility] Video Visit from 06/19/2022 in [facility] Office Visit from 12/29/2021 in [facility]  Total GAD-7 Score 13 7 11       PHQ2-9    Flowsheet Row Office Visit from 04/26/2023 in [facility] Video Visit from 08/15/2022 in [facility] Video Visit from 06/19/2022 in [facility] Office Visit from 12/29/2021 in [facility] Video Visit from 02/01/2021 in [facility]  PHQ-2 Total Score 2 0 0 0 0  PHQ-9 Total Score 17 -- -- -- --      Flowsheet Row Office Visit from 04/26/2023 in [facility] Video Visit from 11/23/2022 in [facility] Video Visit from 08/15/2022 in [facility]  C-SSRS RISK CATEGORY No Risk No Risk No Risk        Assessment and Plan: Willett Budzynski is a 48 year old Caucasian female, employed, lives in [place], married, has a history of GAD, insomnia was evaluated in office today.  Patient is currently struggling with sleep problems, skin problems, will benefit from the following plan.  Plan GAD-stable Cymbalta 40 mg p.o. daily-reduced dosage Gabapentin 300 mg p.o. 3 times daily Continue CBT with Ms. Huey Romans as needed  Insomnia-unstable Ambien 7.5 mg p.o. nightly.  Patient encouraged to use it regularly. Reviewed  PMP AWARxE Continue CPAP  ADHD-patient to continue to follow-up with [place] attention specialist. Continue Vyvanse 40 mg p.o.  daily in divided dosage.  Patient is interested in changing Vyvanse to [place]. Also discussed Mydayis with patient.    Collaboration of Care: Collaboration of Care: Other I have reviewed notes per [facility]-Dr. Stewart-04/25/2023-patient with atopic dermatitis advised to continue Eucrisa, tacrolimus and clobetasol.  Patient also on Dupixent injection 300 mg every 2 weeks.  Patient/Guardian was advised Release of Information must be obtained prior to any record release in order to collaborate their care with an outside provider. Patient/Guardian was advised if they have not already done so to contact the registration department to sign all necessary forms in order for [place] to release information regarding their care.   Consent: Patient/Guardian gives verbal consent for treatment and assignment of benefits for services provided during this visit. Patient/Guardian expressed understanding and agreed to proceed.   Follow-up in clinic in 2 to 3 months or sooner if needed.  This note was generated in part or whole with voice recognition software. Voice recognition is usually quite accurate but there are transcription errors that can and very often do occur. I apologize for any typographical errors that were not detected and corrected.    Jomarie Longs, MD 04/27/2023, 8:14 AM

## 2023-05-16 ENCOUNTER — Other Ambulatory Visit: Payer: Self-pay

## 2023-05-16 MED ORDER — DUPIXENT 300 MG/2ML ~~LOC~~ SOAJ: 300.0000 mg | SUBCUTANEOUS | 5 refills | Status: DC

## 2023-05-16 NOTE — Progress Notes (Signed)
Per Michelene Heady, this prescription can only be filled through [facility]. Escripting to them.

## 2023-05-29 ENCOUNTER — Ambulatory Visit: Payer: 59 | Admitting: Dermatology

## 2023-05-29 VITALS — BP 117/74 | HR 88

## 2023-05-29 DIAGNOSIS — Z79899 Other long term (current) drug therapy: Secondary | ICD-10-CM

## 2023-05-29 DIAGNOSIS — L2089 Other atopic dermatitis: Secondary | ICD-10-CM

## 2023-05-29 DIAGNOSIS — L209 Atopic dermatitis, unspecified: Secondary | ICD-10-CM

## 2023-05-29 NOTE — Progress Notes (Signed)
   Follow-Up Visit   Subjective  Sara Suarez is a 47 y.o. female who presents for the following: Atopic Dermatitis, severe. She was off Dupixent for about a month while waiting on prescription to be delivered. She self-injected Dupixent last Thursday. She does feel like she is improving some with the injections and has decreased itching. She pre-treats with hydroxyzine before injecting and hasn't been as itchy as she was after the injections. No injection site reactions. She has topicals that she uses as needed - Clobetasol/CeraVe mix, Eucrisa, Tacrolimus, and Clobetasol.     The following portions of the chart were reviewed this encounter and updated as appropriate: medications, allergies, medical history  Review of Systems:  No other skin or systemic complaints except as noted in HPI or Assessment and Plan.  Objective  Well appearing patient in no apparent distress; mood and affect are within normal limits.  Areas Examined: Face, arms, feet, arms  Relevant physical exam findings are noted in the Assessment and Plan.    Assessment & Plan      ATOPIC DERMATITIS Exam: Hyperkeratosis of the plantar feet, light pink scaly patches on the right foot dorsum and lateral feet.  Compared to previous photos with improvement noted today  2% BSA  Chronic and persistent condition with duration or expected duration over one year. Condition is symptomatic/ bothersome to patient. Not currently at goal, but improving.   Atopic dermatitis - Severe, on Dupixent (biologic medication).  Atopic dermatitis (eczema) is a chronic, relapsing, pruritic condition that can significantly affect quality of life. It is often associated with allergic rhinitis and/or asthma and can require treatment with topical medications, phototherapy, or in severe cases biologic medications, which require long term medication management.    Treatment Plan:  Avoid picking.  Continue CeraVe/Clobetasol cream twice  daily to body areas as needed for itch    Continue Eucrisa, Tacrolimus, and Clobetasol as directed as needed to affected areas hands/feet for itching/rash.   Continue Dupixent 300 mg/65mL SQ QOW. Patient denies side effects.   Potential side effects include allergic reaction, herpes infections, injection site reactions and conjunctivitis (inflammation of the eyes).  The use of Dupixent requires long term medication management, including periodic office visits.    Long term medication management.  Patient is using long term (months to years) prescription medication  to control their dermatologic condition.  These medications require periodic monitoring to evaluate for efficacy and side effects and may require periodic laboratory monitoring.   Recommend gentle skin care.  Recommend taking Hydroxyzine 50 mg twice daily starting prior to Dupixent injections.    Do not take if taking Benadryl.    Continue Cetirizine 10 mg daily as directed.   Continue ciclopirox cream to feet and between the toes BID.   Return in about 6 months (around 11/29/2023) for Atopic Dermatitis, TBSE.  ICherlyn Labella, CMA, am acting as scribe for Willeen Niece, MD .   Documentation: I have reviewed the above documentation for accuracy and completeness, and I agree with the above.  Willeen Niece, MD

## 2023-05-29 NOTE — Patient Instructions (Signed)

## 2023-06-20 ENCOUNTER — Other Ambulatory Visit: Payer: Self-pay

## 2023-06-20 ENCOUNTER — Encounter: Payer: Self-pay | Admitting: Dermatology

## 2023-06-20 DIAGNOSIS — L700 Acne vulgaris: Secondary | ICD-10-CM

## 2023-06-20 MED ORDER — SPIRONOLACTONE 50 MG PO TABS
ORAL_TABLET | ORAL | 2 refills | Status: DC
Start: 2023-06-20 — End: 2023-09-17

## 2023-06-20 NOTE — Progress Notes (Signed)
Spironolactone refill

## 2023-07-25 ENCOUNTER — Encounter: Payer: Self-pay | Admitting: Psychiatry

## 2023-07-25 ENCOUNTER — Ambulatory Visit: Payer: 59 | Admitting: Psychiatry

## 2023-07-25 VITALS — BP 131/88 | HR 98 | Temp 96.5°F | Ht 64.0 in | Wt 219.9 lb

## 2023-07-25 DIAGNOSIS — F5105 Insomnia due to other mental disorder: Secondary | ICD-10-CM

## 2023-07-25 DIAGNOSIS — F411 Generalized anxiety disorder: Secondary | ICD-10-CM | POA: Diagnosis not present

## 2023-07-25 DIAGNOSIS — F3289 Other specified depressive episodes: Secondary | ICD-10-CM

## 2023-07-25 DIAGNOSIS — F908 Attention-deficit hyperactivity disorder, other type: Secondary | ICD-10-CM

## 2023-07-25 MED ORDER — VENLAFAXINE HCL ER 37.5 MG PO CP24
37.5000 mg | ORAL_CAPSULE | Freq: Every day | ORAL | 1 refills | Status: DC
Start: 2023-08-03 — End: 2023-08-22

## 2023-07-25 MED ORDER — DULOXETINE HCL 20 MG PO CPEP
20.0000 mg | ORAL_CAPSULE | Freq: Every day | ORAL | Status: DC
Start: 2023-07-25 — End: 2023-08-22

## 2023-07-25 NOTE — Patient Instructions (Signed)
Please start taking duloxetine 20 mg daily for 10 days, then start taking it every other day for 3 more doses and stop taking it.  Once he stopped the duloxetine you can start the venlafaxine extended release 37.5 mg daily in the morning.  Venlafaxine Extended-Release Capsules What is this medication? VENLAFAXINE (VEN la fax een) treats depression and anxiety. It increases the amount of serotonin and norepinephrine in the brain, hormones that help regulate mood. It belongs to a group of medications called SNRIs. This medicine may be used for other purposes; ask your health care provider or pharmacist if you have questions. COMMON BRAND NAME(S): Effexor XR What should I tell my care team before I take this medication? They need to know if you have any of these conditions: Bleeding disorders Glaucoma Heart disease High blood pressure High cholesterol Kidney disease Liver disease Low levels of sodium in the blood Mania or bipolar disorder Seizures Suicidal thoughts, plans, or attempt by you or a family member Take medications that treat or prevent blood clots Thyroid disease An unusual or allergic reaction to venlafaxine, other medications, foods, dyes, or preservatives Pregnant or trying to get pregnant Breastfeeding How should I use this medication? Take this medication by mouth with a full glass of water. Take it as directed on the prescription label. Do not cut, crush, or chew this medication. Take it with food. You may open the capsule and put the contents in 1 teaspoon of applesauce. Swallow the medication and applesauce right away. Do not chew the medication or applesauce. Follow with a glass of water to ensure complete swallowing of the pellets. Try to take your medication at about the same time each day. Do not take your medication more often than directed. Keep taking this medication unless your care team tells you to stop. Stopping it too quickly can cause serious side effects. It  can also make your condition worse. A special MedGuide will be given to you by the pharmacist with each prescription and refill. Be sure to read this information carefully each time. Talk to your care team about the use of this medication in children. Special care may be needed. Overdosage: If you think you have taken too much of this medicine contact a poison control center or emergency room at once. NOTE: This medicine is only for you. Do not share this medicine with others. What if I miss a dose? If you miss a dose, take it as soon as you can. If it is almost time for your next dose, take only that dose. Do not take double or extra doses. What may interact with this medication? Do not take this medication with any of the following: Alcohol Certain medications for fungal infections, such as fluconazole, itraconazole, ketoconazole, posaconazole, voriconazole Cisapride Desvenlafaxine Dronedarone Duloxetine Levomilnacipran Linezolid MAOIs, such as Carbex, Eldepryl, Marplan, Nardil, and Parnate Methylene blue (injected into a vein) Milnacipran Pimozide Thioridazine This medication may also interact with the following: Amphetamines Aspirin and aspirin-like medications Certain medications for mental health conditions Certain medications for migraine headaches, such as almotriptan, eletriptan, frovatriptan, naratriptan, rizatriptan, sumatriptan, zolmitriptan Certain medications for sleep Certain medications that treat or prevent blood clots, such as dalteparin, enoxaparin, warfarin Cimetidine Clozapine Diuretics Fentanyl Furazolidone Indinavir Isoniazid Lithium Metoprolol NSAIDS, medications for pain and inflammation, such as ibuprofen or naproxen Other medications that cause heart rhythm changes Procarbazine Rasagiline Supplements, such as St. John's wort, kava kava, valerian Tramadol Tryptophan This list may not describe all possible interactions. Give your health care  provider a list of all the medicines, herbs, non-prescription drugs, or dietary supplements you use. Also tell them if you smoke, drink alcohol, or use illegal drugs. Some items may interact with your medicine. What should I watch for while using this medication? Tell your care team if your symptoms do not get better or if they get worse. Visit your care team for regular checks on your progress. Because it may take several weeks to see the full effects of this medication, it is important to continue your treatment as prescribed by your care team. Watch for new or worsening thoughts of suicide or depression. This includes sudden changes in mood, behaviors, or thoughts. These changes can happen at any time but are more common in the beginning of treatment or after a change in dose. Call your care team right away if you experience these thoughts or worsening depression. This medication may cause mood and behavior changes, such as anxiety, nervousness, irritability, hostility, restlessness, excitability, hyperactivity, or trouble sleeping. These changes can happen at any time but are more common in the beginning of treatment or after a change in dose. Call your care team right away if you notice any of these symptoms. This medication can cause an increase in blood pressure. Check with your care team for instructions on monitoring your blood pressure while taking this medication. This medication may affect your coordination, reaction time, or judgment. Do not drive or operate machinery until you know how this medication affects you. Sit up or stand slowly to reduce the risk of dizzy or fainting spells. Drinking alcohol with this medication can increase the risk of these side effects. Your mouth may get dry. Chewing sugarless gum or sucking hard candy and drinking plenty of water may help. Contact your care team if the problem does not go away or is severe. What side effects may I notice from receiving this  medication? Side effects that you should report to your care team as soon as possible: Allergic reactions--skin rash, itching, hives, swelling of the face, lips, tongue, or throat Bleeding--bloody or black, tar-like stools, red or dark brown urine, vomiting blood or brown material that looks like coffee grounds, small, red or purple spots on skin, unusual bleeding or bruising Heart rhythm changes--fast or irregular heartbeat, dizziness, feeling faint or lightheaded, chest pain, trouble breathing Increase in blood pressure Loss of appetite with weight loss Low sodium level--muscle weakness, fatigue, dizziness, headache, confusion Serotonin syndrome--irritability, confusion, fast or irregular heartbeat, muscle stiffness, twitching muscles, sweating, high fever, seizures, chills, vomiting, diarrhea Sudden eye pain or change in vision such as blurry vision, seeing halos around lights, vision loss Thoughts of suicide or self-harm, worsening mood, feelings of depression Side effects that usually do not require medical attention (report to your care team if they continue or are bothersome): Anxiety, nervousness Change in sex drive or performance Dizziness Dry mouth Excessive sweating Nausea Tremors or shaking Trouble sleeping This list may not describe all possible side effects. Call your doctor for medical advice about side effects. You may report side effects to FDA at 1-800-FDA-1088. Where should I keep my medication? Keep out of the reach of children and pets. Store at a controlled temperature between 20 and 25 degrees C (68 degrees and 77 degrees F), in a dry place. Throw away any unused medication after the expiration date. NOTE: This sheet is a summary. It may not cover all possible information. If you have questions about this medicine, talk to your doctor, pharmacist, or health  care provider.  2024 Elsevier/Gold Standard (2022-10-26 00:00:00)

## 2023-07-25 NOTE — Progress Notes (Unsigned)
BH MD OP Progress Note  07/25/2023 11:39 AM Sara Suarez  MRN:  [id number]  Chief Complaint:  Chief Complaint  Patient presents with   Follow-up   Depression   Anxiety   Medication Refill   HPI: Sara Suarez is a 47 year old Caucasian female, lives in [place], has a history of GAD, insomnia, ADHD, psoriasis, atopic dermatitis was evaluated in office today.  Patient today reports she is currently in a lot of pain, has neuropathic pain of her back as well as Achilles pain.  The pain does have an impact on her day-to-day functioning as well as her sleep.  She reports her current anxiety symptoms gets worse when she is in a lot of pain.  The pain prevents her from being active and that does cause a lot of problems with her mood, feeling sad, anhedonia, low motivation, as well as affects her energy level.  Patient reports she is currently compliant on the duloxetine takes only a 40 mg daily.  She used to be on 80 mg of duloxetine previously.  When gabapentin was added for pain the duloxetine was tapered down.  She has been taking the duloxetine for her mood for a long time.  Patient agreeable to changing to a new antidepressant like venlafaxine which may also help pain to some extent.  Patient reports inattention has improved on the current dosage of stimulant-Jornay PM, currently taking a 60 mg daily.  It is being prescribed by [place] [facility] specialist.  She is no longer on the Vyvanse.  Patient denies any suicidality, homicidality or perceptual disturbances.  Patient denies any other concerns today.  Visit Diagnosis:    ICD-10-CM   1. Generalized anxiety disorder  F41.1 DULoxetine (CYMBALTA) 20 MG capsule    venlafaxine XR (EFFEXOR XR) 37.5 MG 24 hr capsule    2. Insomnia due to mental condition  F51.05    mood    3. 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  Likely due to current pain.    4. Attention deficit  hyperactivity disorder (ADHD), other type  F90.8 venlafaxine XR (EFFEXOR XR) 37.5 MG 24 hr capsule      Past Psychiatric History: I have reviewed past psychiatric history from progress note on 07/09/2019.  Past trials of Cymbalta, Klonopin, Ambien, BuSpar.  Past Medical History:  Past Medical History:  Diagnosis Date   ADHD (attention deficit hyperactivity disorder)    Allergy    Anxiety    Depression     Past Surgical History:  Procedure Laterality Date   BREAST CYST ASPIRATION Right    neg   ENDOSCOPIC PLANTAR FASCIOTOMY     FOOT SURGERY     MOUTH SURGERY     TUBAL LIGATION      Family Psychiatric History: I have reviewed family psychiatric history from progress note on 07/09/2019.  Family History:  Family History  Problem Relation Age of Onset   Breast cancer Mother 74   Anxiety disorder Mother    Breast cancer Maternal Aunt 71   Anxiety disorder Sister    Panic disorder Sister    Dementia Maternal Grandmother    Dementia Paternal Grandmother     Social History: I have reviewed social history from progress note on 07/09/2019. Social History   Socioeconomic History   Marital status: Married    Spouse name: jon   Number of children: 0   Years of education: Not on file   Highest education level: Bachelor's  degree (e.g., BA, AB, BS)  Occupational History   Not on file  Tobacco Use   Smoking status: Never   Smokeless tobacco: Never  Vaping Use   Vaping status: Never Used  Substance and Sexual Activity   Alcohol use: Never   Drug use: Never   Sexual activity: Not on file  Other Topics Concern   Not on file  Social History Narrative   Not on file   Social Determinants of Health   Financial Resource Strain: Low Risk  (07/09/2019)   Overall Financial Resource Strain (CARDIA)    Difficulty of Paying Living Expenses: Not hard at all  Food Insecurity: No Food Insecurity (07/09/2019)   Hunger Vital Sign    Worried About Running Out of Food in the Last Year:  Never true    Ran Out of Food in the Last Year: Never true  Transportation Needs: No Transportation Needs (07/09/2019)   PRAPARE - Administrator, Civil Service (Medical): No    Lack of Transportation (Non-Medical): No  Physical Activity: Inactive (07/09/2019)   Exercise Vital Sign    Days of Exercise per Week: 0 days    Minutes of Exercise per Session: 0 min  Stress: Stress Concern Present (07/09/2019)   [employer] of Occupational Health - Occupational Stress Questionnaire    Feeling of Stress : Rather much  Social Connections: Unknown (07/09/2019)   Social Connection and Isolation Panel [NHANES]    Frequency of Communication with Friends and Family: Not on file    Frequency of Social Gatherings with Friends and Family: Not on file    Attends Religious Services: Never    Active Member of Clubs or Organizations: Yes    Attends Banker Meetings: More than 4 times per year    Marital Status: Married    Allergies:  Allergies  Allergen Reactions   Adbry [Tralokinumab-Ldrm] Rash   Atomoxetine Other (See Comments)    Heartburn   Guanfacine Other (See Comments)    drowsiness   Chlorpheniramine-Pseudoeph     Hyperactive   Dupilumab Other (See Comments)    Injection reaction but tolerable per patient   Pseudoephedrine Other (See Comments)    Hyperactive Other reaction(s): insomnia, shakey   Shellfish Allergy Other (See Comments)   Hydrocodone Itching   Prednisone Anxiety    insomnia    Metabolic Disorder Labs: No results found for: "HGBA1C", "MPG" No results found for: "PROLACTIN" Lab Results  Component Value Date   CHOL 173 02/06/2023   TRIG 94 02/06/2023   HDL 48 02/06/2023   CHOLHDL 3.6 02/06/2023   LDLCALC 108 (H) 02/06/2023   No results found for: "TSH"  Therapeutic Level Labs: No results found for: "LITHIUM" No results found for: "VALPROATE" No results found for: "CBMZ"  Current Medications: Current Outpatient Medications   Medication Sig Dispense Refill   albuterol (VENTOLIN HFA) 108 (90 Base) MCG/ACT inhaler Inhale 1-2 puffs into the lungs every 4 (four) hours as needed for wheezing or shortness of breath. 1 each 0   azelastine (ASTELIN) 0.1 % nasal spray azelastine 137 mcg (0.1 %) nasal spray aerosol     Cetirizine HCl 10 MG CAPS Take by mouth.     ciclopirox (LOPROX) 0.77 % cream Apply to the feet and between the toes BID x 4 weeks. 90 g 0   clobetasol (TEMOVATE) 0.05 % external solution Mix with CeraVe cream and use as directed. Avoid applying to face, groin, and axilla. 50 mL 2   clobetasol  cream (TEMOVATE) 0.05 % Apply to affected areas rash on hands and foot twice daily for flares until improved. Avoid face, groin, underarms. 60 g 1   Crisaborole (EUCRISA) 2 % OINT APPLY TO AFFECTED AREAS HANDS AND FEET 1-2 TIMES A DAY FOR MAINTENANCE. 60 g 2   cyclobenzaprine (FLEXERIL) 5 MG tablet Take by mouth.     Diclofenac Sodium (PENNSAID) 2 % SOLN      DULoxetine (CYMBALTA) 20 MG capsule Take 1 capsule (20 mg total) by mouth daily for 10 days.     Dupilumab (DUPIXENT) 300 MG/2ML SOPN Inject 300 mg into the skin every 14 (fourteen) days. Starting at day 15 for maintenance. 4 mL 5   EPINEPHrine 0.3 mg/0.3 mL IJ SOAJ injection See admin instructions.     gabapentin (NEURONTIN) 300 MG capsule Take by mouth. Takes differently     hydrOXYzine (VISTARIL) 50 MG capsule TAKE 1 CAPSULE (50 MG TOTAL) BY MOUTH 2 (TWO) TIMES DAILY AS NEEDED. 180 capsule 1   ibuprofen (ADVIL) 600 MG tablet Take 1 tablet (600 mg total) by mouth every 6 (six) hours as needed. 30 tablet 0   JORNAY PM 60 MG CP24 Take 1 capsule by mouth at bedtime.     losartan (COZAAR) 25 MG tablet Take 1 tablet by mouth daily.     mupirocin ointment (BACTROBAN) 2 % Apply 1 Application topically daily. 22 g 0   Na Sulfate-K Sulfate-Mg Sulf 17.5-3.13-1.6 GM/177ML SOLN Take 1 Bottle by mouth as directed.     Spacer/Aero-Holding Chambers (AEROCHAMBER MV) inhaler Use as  instructed 1 each 1   spironolactone (ALDACTONE) 50 MG tablet Take 1-2 tablets by mouth every day for acne. 60 tablet 2   SUMAtriptan (IMITREX) 50 MG tablet Take by mouth.     tacrolimus (PROTOPIC) 0.1 % ointment Apply topically as directed. Qd to bid to aa hands, right foot until clear, then prn flares 100 g 2   triamcinolone (NASACORT) 55 MCG/ACT AERO nasal inhaler Nasal Allergy 55 mcg spray aerosol  2 SPRAYS ONCE A DAY NASALLY 30 DAYS     [START ON 08/03/2023] venlafaxine XR (EFFEXOR XR) 37.5 MG 24 hr capsule Take 1 capsule (37.5 mg total) by mouth daily with breakfast. 30 capsule 1   VITAMIN D PO Take by mouth.     zolpidem (AMBIEN) 5 MG tablet Take 1-1.5 tablets (5-7.5 mg total) by mouth at bedtime as needed for sleep. 45 tablet 1   No current facility-administered medications for this visit.     Musculoskeletal: Strength & Muscle Tone: within normal limits Gait & Station: normal Patient leans: N/A  Psychiatric Specialty Exam: Review of Systems  Musculoskeletal:  Positive for back pain.  Psychiatric/Behavioral:  Positive for sleep disturbance. The patient is nervous/anxious.     Blood pressure 131/88, pulse 98, temperature (!) 96.5 F (35.8 C), temperature source Skin, height 5\' 4"  (1.626 m), weight 219 lb 14.4 oz (99.7 kg).Body mass index is 37.75 kg/m.  General Appearance: Fairly Groomed  Eye Contact:  Fair  Speech:  Normal Rate  Volume:  Normal  Mood:  Anxious  Affect:  Congruent  Thought Process:  Goal Directed and Descriptions of Associations: Intact  Orientation:  Full (Time, Place, and Person)  Thought Content: Logical   Suicidal Thoughts:  No  Homicidal Thoughts:  No  Memory:  Immediate;   Fair Recent;   Fair Remote;   Fair  Judgement:  Fair  Insight:  Fair  Psychomotor Activity:  Normal  Concentration:  Concentration:  Fair and Attention Span: Fair  Recall:  [employer] of Knowledge: Fair  Language: Fair  Akathisia:  No  Handed:  Right  AIMS (if  indicated): not done  Assets:  Communication Skills Desire for Improvement Housing Social Support  ADL's:  Intact  Cognition: WNL  Sleep:   restless due to pain and does not use of CPAP regularly   Screenings: AIMS    Flowsheet Row Video Visit from 06/19/2022 in [facility]  AIMS Total Score 0      GAD-7    Flowsheet Row Office Visit from 07/25/2023 in [facility] Office Visit from 04/26/2023 in [facility] Video Visit from 06/19/2022 in [facility] Office Visit from 12/29/2021 in [facility]  Total GAD-7 Score 15 13 7 11       PHQ2-9    Flowsheet Row Office Visit from 07/25/2023 in [facility] Office Visit from 04/26/2023 in [facility] Video Visit from 08/15/2022 in [facility] Video Visit from 06/19/2022 in [facility] Office Visit from 12/29/2021 in [facility]  PHQ-2 Total Score 2 2 0 0 0  PHQ-9 Total Score 15 17 -- -- --      Flowsheet Row Office Visit from 07/25/2023 in [facility] Office Visit from 04/26/2023 in [facility] Video Visit from 11/23/2022 in [facility]  C-SSRS RISK CATEGORY No Risk No Risk No Risk        Assessment and Plan: Sara Suarez is a 47 year old Caucasian female, employed, lives in [place], married, has a history of GAD, insomnia was evaluated in office today.  Patient with worsening anxiety, as well as some depression symptoms which have also contributed to by her current pain, will benefit from replacing Cymbalta with another  antidepressant as noted below, plan as noted below.  Plan GAD-unstable Taper of Cymbalta, advised patient to start taking Cymbalta 20 mg p.o. daily for 10 days, 20 mg every other day for 3 more dosages and stopped taking it. Once she stops taking Cymbalta she can start venlafaxine extended release 37.5 mg p.o. daily in the morning. Provided medication education, discussed side effects. Discussed drug-drug interaction including serotonin syndrome. Continue gabapentin as prescribed-patient to verify her dosage she will check and let this provider know. Continue CBT with Ms. Huey Romans as needed  Depressive disorder-unstable Taper of Cymbalta as noted above, start venlafaxine extended release 37.5 mg p.o. daily  Insomnia-affected by pain She will need sufficient pain management. Ambien 7.5 mg p.o. nightly Continue CPAP.  Patient encouraged to start using it more regularly.  ADHD-currently under the care of [place] attention specialist Patient is currently on Jornay PM 60 mg p.o. daily   Collaboration of Care: Collaboration of Care: Other patient manage sufficient pain management, continue CBT.  Patient/Guardian was advised Release of Information must be obtained prior to any record release in order to collaborate their care with an outside provider. Patient/Guardian was advised if they have not already done so to contact the registration department to sign all necessary forms in order for [place] to release information regarding their care.   Consent: Patient/Guardian gives verbal consent for treatment and assignment of benefits for services provided during this visit. Patient/Guardian expressed understanding and agreed  to proceed.   Follow-up in clinic in 4 weeks or sooner if needed.  This note was generated in part or whole with voice recognition software. Voice recognition is usually quite accurate but there are transcription errors that can and very often do occur. I apologize for any  typographical errors that were not detected and corrected.    Jomarie Longs, MD 07/25/2023, 11:39 AM

## 2023-08-22 ENCOUNTER — Encounter: Payer: Self-pay | Admitting: Psychiatry

## 2023-08-22 ENCOUNTER — Ambulatory Visit: Payer: 59 | Admitting: Psychiatry

## 2023-08-22 VITALS — BP 114/82 | HR 105 | Temp 96.7°F | Ht 64.0 in | Wt 222.0 lb

## 2023-08-22 DIAGNOSIS — F411 Generalized anxiety disorder: Secondary | ICD-10-CM | POA: Diagnosis not present

## 2023-08-22 DIAGNOSIS — F988 Other specified behavioral and emotional disorders with onset usually occurring in childhood and adolescence: Secondary | ICD-10-CM

## 2023-08-22 DIAGNOSIS — F3289 Other specified depressive episodes: Secondary | ICD-10-CM | POA: Diagnosis not present

## 2023-08-22 DIAGNOSIS — F5105 Insomnia due to other mental disorder: Secondary | ICD-10-CM | POA: Diagnosis not present

## 2023-08-22 MED ORDER — VENLAFAXINE HCL ER 75 MG PO CP24
75.0000 mg | ORAL_CAPSULE | Freq: Every day | ORAL | 1 refills | Status: DC
Start: 2023-08-22 — End: 2024-02-20

## 2023-08-22 NOTE — Progress Notes (Unsigned)
BH MD OP Progress Note  08/22/2023 9:05 AM Sara Suarez  MRN:  [id number]  Chief Complaint:  Chief Complaint  Patient presents with   Follow-up   Depression   Anxiety   ADD   Medication Refill   HPI: Sara Suarez is a 47 year old Caucasian female, lives in [place], has a history of GAD, insomnia, ADHD, psoriasis, atopic dermatitis was evaluated in office today.  Patient was late for her appointment today.  Patient reports she is currently compliant on the venlafaxine.  She was able to stop taking the Cymbalta.  She has not noticed any side effects to the venlafaxine.  She does feel less depressed than she used to before.  She does have a lot of anxiety right now due to situational stressors.  There has been a lot of changes happening at her work and that does make her overwhelmed.  Patient agreeable to increasing the dosage of venlafaxine to 75 mg.  Patient reports sleep is overall okay.  She does have restlessness of sleep at times.  She sleeps on the couch to sleep with her dogs.  Patient reports she does have a CPAP however she is not very compliant.  Uses the Ambien as needed only.  Patient denies any suicidality, homicidality or perceptual disturbances.  Patient reports she is currently on Jornay for her attention and focus problems however does not know how much it is beneficial at this time with everything going on at work.  Patient denies any other concerns today.  Visit Diagnosis:    ICD-10-CM   1. Generalized anxiety disorder  F41.1 venlafaxine XR (EFFEXOR XR) 75 MG 24 hr capsule    2. Insomnia due to mental condition  F51.05    mood    3. 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 Attention deficit disorder (ADD) in adult  F98.8       Past Psychiatric History: I have reviewed past psychiatric history from progress note on 07/09/2019.  Past trials of Cymbalta, Klonopin, Ambien, BuSpar  Past Medical  History:  Past Medical History:  Diagnosis Date   ADHD (attention deficit hyperactivity disorder)    Allergy    Anxiety    Depression     Past Surgical History:  Procedure Laterality Date   BREAST CYST ASPIRATION Right    neg   ENDOSCOPIC PLANTAR FASCIOTOMY     FOOT SURGERY     MOUTH SURGERY     TUBAL LIGATION      Family Psychiatric History: I have reviewed family psychiatric history from progress note on 07/09/2019.  Family History:  Family History  Problem Relation Age of Onset   Breast cancer Mother 59   Anxiety disorder Mother    Breast cancer Maternal Aunt 9   Anxiety disorder Sister    Panic disorder Sister    Dementia Maternal Grandmother    Dementia Paternal Grandmother     Social History: I have reviewed social history from progress note on 07/09/2019. Social History   Socioeconomic History   Marital status: Married    Spouse name: jon   Number of children: 0   Years of education: Not on file   Highest education level: Bachelor's degree (e.g., BA, AB, BS)  Occupational History   Not on file  Tobacco Use   Smoking status: Never   Smokeless tobacco: Never  Vaping Use   Vaping status: Never Used  Substance and Sexual Activity  Alcohol use: Never   Drug use: Never   Sexual activity: Not on file  Other Topics Concern   Not on file  Social History Narrative   Not on file   Social Determinants of Health   Financial Resource Strain: Low Risk  (07/09/2019)   Overall Financial Resource Strain (CARDIA)    Difficulty of Paying Living Expenses: Not hard at all  Food Insecurity: No Food Insecurity (07/09/2019)   Hunger Vital Sign    Worried About Running Out of Food in the Last Year: Never true    Ran Out of Food in the Last Year: Never true  Transportation Needs: No Transportation Needs (07/09/2019)   PRAPARE - Administrator, Civil Service (Medical): No    Lack of Transportation (Non-Medical): No  Physical Activity: Inactive (07/09/2019)    Exercise Vital Sign    Days of Exercise per Week: 0 days    Minutes of Exercise per Session: 0 min  Stress: Stress Concern Present (07/09/2019)   [employer] of Occupational Health - Occupational Stress Questionnaire    Feeling of Stress : Rather much  Social Connections: Unknown (07/09/2019)   Social Connection and Isolation Panel [NHANES]    Frequency of Communication with Friends and Family: Not on file    Frequency of Social Gatherings with Friends and Family: Not on file    Attends Religious Services: Never    Active Member of Clubs or Organizations: Yes    Attends Banker Meetings: More than 4 times per year    Marital Status: Married    Allergies:  Allergies  Allergen Reactions   Adbry [Tralokinumab-Ldrm] Rash   Atomoxetine Other (See Comments)    Heartburn   Guanfacine Other (See Comments)    drowsiness   Chlorpheniramine-Pseudoeph     Hyperactive   Dupilumab Other (See Comments)    Injection reaction but tolerable per patient   Pseudoephedrine Other (See Comments)    Hyperactive Other reaction(s): insomnia, shakey   Shellfish Allergy Other (See Comments)   Hydrocodone Itching   Prednisone Anxiety    insomnia    Metabolic Disorder Labs: No results found for: "HGBA1C", "MPG" No results found for: "PROLACTIN" Lab Results  Component Value Date   CHOL 173 02/06/2023   TRIG 94 02/06/2023   HDL 48 02/06/2023   CHOLHDL 3.6 02/06/2023   LDLCALC 108 (H) 02/06/2023   No results found for: "TSH"  Therapeutic Level Labs: No results found for: "LITHIUM" No results found for: "VALPROATE" No results found for: "CBMZ"  Current Medications: Current Outpatient Medications  Medication Sig Dispense Refill   albuterol (VENTOLIN HFA) 108 (90 Base) MCG/ACT inhaler Inhale 1-2 puffs into the lungs every 4 (four) hours as needed for wheezing or shortness of breath. 1 each 0   azelastine (ASTELIN) 0.1 % nasal spray azelastine 137 mcg (0.1 %) nasal spray  aerosol     Cetirizine HCl 10 MG CAPS Take by mouth.     ciclopirox (LOPROX) 0.77 % cream Apply to the feet and between the toes BID x 4 weeks. 90 g 0   clobetasol (TEMOVATE) 0.05 % external solution Mix with CeraVe cream and use as directed. Avoid applying to face, groin, and axilla. 50 mL 2   clobetasol cream (TEMOVATE) 0.05 % Apply to affected areas rash on hands and foot twice daily for flares until improved. Avoid face, groin, underarms. 60 g 1   Crisaborole (EUCRISA) 2 % OINT APPLY TO AFFECTED AREAS HANDS AND FEET 1-2 TIMES  A DAY FOR MAINTENANCE. 60 g 2   cyclobenzaprine (FLEXERIL) 5 MG tablet Take by mouth.     Diclofenac Sodium (PENNSAID) 2 % SOLN      Dupilumab (DUPIXENT) 300 MG/2ML SOPN Inject 300 mg into the skin every 14 (fourteen) days. Starting at day 15 for maintenance. 4 mL 5   EPINEPHrine 0.3 mg/0.3 mL IJ SOAJ injection See admin instructions.     gabapentin (NEURONTIN) 300 MG capsule Take 600 mg by mouth 3 (three) times daily. Takes differently     hydrOXYzine (VISTARIL) 50 MG capsule TAKE 1 CAPSULE (50 MG TOTAL) BY MOUTH 2 (TWO) TIMES DAILY AS NEEDED. 180 capsule 1   ibuprofen (ADVIL) 600 MG tablet Take 1 tablet (600 mg total) by mouth every 6 (six) hours as needed. 30 tablet 0   JORNAY PM 60 MG CP24 Take 1 capsule by mouth at bedtime.     losartan (COZAAR) 25 MG tablet Take 1 tablet by mouth daily.     mupirocin ointment (BACTROBAN) 2 % Apply 1 Application topically daily. 22 g 0   Spacer/Aero-Holding Chambers (AEROCHAMBER MV) inhaler Use as instructed 1 each 1   spironolactone (ALDACTONE) 50 MG tablet Take 1-2 tablets by mouth every day for acne. 60 tablet 2   SUMAtriptan (IMITREX) 50 MG tablet Take by mouth.     tacrolimus (PROTOPIC) 0.1 % ointment Apply topically as directed. Qd to bid to aa hands, right foot until clear, then prn flares 100 g 2   triamcinolone (NASACORT) 55 MCG/ACT AERO nasal inhaler Nasal Allergy 55 mcg spray aerosol  2 SPRAYS ONCE A DAY NASALLY 30 DAYS      venlafaxine XR (EFFEXOR XR) 75 MG 24 hr capsule Take 1 capsule (75 mg total) by mouth daily with breakfast. 90 capsule 1   VITAMIN D PO Take by mouth.     zolpidem (AMBIEN) 5 MG tablet Take 1-1.5 tablets (5-7.5 mg total) by mouth at bedtime as needed for sleep. 45 tablet 1   Na Sulfate-K Sulfate-Mg Sulf 17.5-3.13-1.6 GM/177ML SOLN Take 1 Bottle by mouth as directed. (Patient not taking: Reported on 08/22/2023)     No current facility-administered medications for this visit.     Musculoskeletal: Strength & Muscle Tone: within normal limits Gait & Station: normal Patient leans: N/A  Psychiatric Specialty Exam: Review of Systems  Psychiatric/Behavioral:  Positive for decreased concentration and sleep disturbance. The patient is nervous/anxious.     Blood pressure 114/82, pulse (!) 105, temperature (!) 96.7 F (35.9 C), temperature source Skin, height 5\' 4"  (1.626 m), weight 222 lb (100.7 kg).Body mass index is 38.11 kg/m.  General Appearance: Fairly Groomed  Eye Contact:  Fair  Speech:  Clear and Coherent  Volume:  Normal  Mood:  Anxious, sadness - improving  Affect:  Congruent  Thought Process:  Goal Directed and Descriptions of Associations: Intact  Orientation:  Full (Time, Place, and Person)  Thought Content: Logical   Suicidal Thoughts:  No  Homicidal Thoughts:  No  Memory:  Immediate;   Fair Recent;   Fair Remote;   Fair  Judgement:  Fair  Insight:  Fair  Psychomotor Activity:  Normal  Concentration:  Concentration: Fair and Attention Span: Fair  Recall:  [employer] of Knowledge: Fair  Language: Fair  Akathisia:  No  Handed:  Right  AIMS (if indicated): not done  Assets:  Communication Skills Desire for Improvement Housing Social Support  ADL's:  Intact  Cognition: WNL  Sleep:   improving  Screenings: AIMS    Flowsheet Row Video Visit from 06/19/2022 in [facility]  AIMS Total Score 0      GAD-7    Flowsheet  Row Office Visit from 07/25/2023 in [facility] Office Visit from 04/26/2023 in [facility] Video Visit from 06/19/2022 in [facility] Office Visit from 12/29/2021 in [facility]  Total GAD-7 Score 15 13 7 11       PHQ2-9    Flowsheet Row Office Visit from 07/25/2023 in [facility] Office Visit from 04/26/2023 in [facility] Video Visit from 08/15/2022 in [facility] Video Visit from 06/19/2022 in [facility] Office Visit from 12/29/2021 in [facility]  PHQ-2 Total Score 2 2 0 0 0  PHQ-9 Total Score 15 17 -- -- --      Flowsheet Row Office Visit from 07/25/2023 in [facility] Office Visit from 04/26/2023 in [facility] Video Visit from 11/23/2022 in [facility]  C-SSRS RISK CATEGORY No Risk No Risk No Risk        Assessment and Plan: Sara Suarez is a 47 year old Caucasian female, employed, lives in [place], married, has a history of GAD, insomnia was evaluated in office today.  Patient currently tolerating venlafaxine well will benefit from dosage increase to target ongoing anxiety.  Plan as noted below.  Plan GAD-unstable Increase venlafaxine extended release to 75 mg p.o. daily in the morning Discontinue Cymbalta. Continue CBT with Ms. Huey Romans as needed  Depressive disorder-improving Venlafaxine extended release 75 mg p.o. daily in the morning   Insomnia-improving Patient to work on sleep hygiene as well as start using CPAP. Ambien 7.5 mg p.o. nightly as needed Reviewed [place] PMP  AWARxE  ADHD-currently under the care of [place] attention specialist.  Patient is currently on Jornay PM 60 mg daily.      Collaboration of Care: Collaboration of Care: Other discussed lifestyle modification, exercise, discussed the referral to lifestyle medicine specialist.  Patient reports she will reach out to her employee assistance program.  Patient/Guardian was advised Release of Information must be obtained prior to any record release in order to collaborate their care with an outside provider. Patient/Guardian was advised if they have not already done so to contact the registration department to sign all necessary forms in order for [place] to release information regarding their care.   Consent: Patient/Guardian gives verbal consent for treatment and assignment of benefits for services provided during this visit. Patient/Guardian expressed understanding and agreed to proceed.   Follow-up in clinic in 8 weeks or sooner if needed.  This note was generated in part or whole with voice recognition software. Voice recognition is usually quite accurate but there are transcription errors that can and very often do occur. I apologize for any typographical errors that were not detected and corrected.    Jomarie Longs, MD 08/22/2023, 9:05 AM

## 2023-09-15 ENCOUNTER — Other Ambulatory Visit: Payer: Self-pay | Admitting: Dermatology

## 2023-09-15 DIAGNOSIS — L700 Acne vulgaris: Secondary | ICD-10-CM

## 2023-09-23 ENCOUNTER — Other Ambulatory Visit: Payer: Self-pay | Admitting: Psychiatry

## 2023-09-23 DIAGNOSIS — F908 Attention-deficit hyperactivity disorder, other type: Secondary | ICD-10-CM

## 2023-09-23 DIAGNOSIS — F411 Generalized anxiety disorder: Secondary | ICD-10-CM

## 2023-09-29 ENCOUNTER — Other Ambulatory Visit: Payer: Self-pay | Admitting: Psychiatry

## 2023-09-29 DIAGNOSIS — F411 Generalized anxiety disorder: Secondary | ICD-10-CM

## 2023-10-17 ENCOUNTER — Telehealth: Payer: 59 | Admitting: Psychiatry

## 2023-10-17 ENCOUNTER — Encounter: Payer: Self-pay | Admitting: Psychiatry

## 2023-10-17 DIAGNOSIS — F988 Other specified behavioral and emotional disorders with onset usually occurring in childhood and adolescence: Secondary | ICD-10-CM | POA: Diagnosis not present

## 2023-10-17 DIAGNOSIS — F5105 Insomnia due to other mental disorder: Secondary | ICD-10-CM

## 2023-10-17 DIAGNOSIS — F3289 Other specified depressive episodes: Secondary | ICD-10-CM | POA: Diagnosis not present

## 2023-10-17 DIAGNOSIS — F411 Generalized anxiety disorder: Secondary | ICD-10-CM | POA: Diagnosis not present

## 2023-10-17 NOTE — Progress Notes (Signed)
Virtual Visit via Video Note  I connected with Sara Suarez on 10/17/23 at  4:00 PM EST by a video enabled telemedicine application and verified that I am speaking with the correct person using two identifiers.  Location Provider Location : [facility] Patient Location : Home  Participants: Patient , Provider    I discussed the limitations of evaluation and management by telemedicine and the availability of in person appointments. The patient expressed understanding and agreed to proceed.   I discussed the assessment and treatment plan with the patient. The patient was provided an opportunity to ask questions and all were answered. The patient agreed with the plan and demonstrated an understanding of the instructions.   The patient was advised to call back or seek an in-person evaluation if the symptoms worsen or if the condition fails to improve as anticipated.   BH MD OP Progress Note  10/19/2023 7:46 AM Girtrue Stcroix  MRN:  [id number]  Chief Complaint:  Chief Complaint  Patient presents with   Follow-up   Depression   Anxiety   ADHD   Medication Refill   Grief   HPI: Sara Suarez is a 47 year old Caucasian female, lives in [place], has a history of GAD, insomnia, ADHD, psoriasis, atopic dermatitis was evaluated by telemedicine today.  The patient has been managing anxiety and depression with venlafaxine, which was recently increased in dosage. She reports no disturbing side effects from the medication. However, she has been experiencing significant life stressors that have exacerbated her symptoms.  The patient recently lost a pet to lymphoma, which was a stressful and emotional experience. She attempted to adopt another dog, but the dog escaped and has not been found, causing further distress. The patient has been spending hours each day searching for the lost dog, which has added to her anxiety.  In addition to these personal stressors, the patient has been  dealing with work-related stress. 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  The patient also suffers from nerve pain, for which she is scheduled to receive an injection in her back.   She is currently on [place] for ADHD, which she reports as being better than not being on any ADHD medication. She occasionally experiences lack of focus, but at other times she can focus well.  The patient has not been in touch with her therapist, Huey Romans, for a while and is considering scheduling an appointment to help manage her current stressors. She is also considering whether to increase the dosage of venlafaxine, but is unsure if her increased anxiety is situational or not.  The patient is planning to travel out of the country at the end of January, which may also be contributing to her current stress levels. She plans to reassess her medication needs upon return.  Patient currently denies any suicidality, homicidality or perceptual disturbances.    Visit Diagnosis:    ICD-10-CM   1. 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 Attention deficit disorder (ADD) in adult  F98.8       Past Psychiatric History: I have reviewed past psychiatric history from progress note on 07/09/2019.  Past trials of Cymbalta, Klonopin, Ambien, BuSpar.  Past Medical History:  Past Medical History:  Diagnosis  Date   ADHD (attention deficit hyperactivity disorder)    Allergy    Anxiety    Depression     Past Surgical History:  Procedure Laterality Date   BREAST CYST ASPIRATION Right    neg   ENDOSCOPIC PLANTAR FASCIOTOMY     FOOT SURGERY     MOUTH SURGERY     TUBAL LIGATION      Family Psychiatric History: I have reviewed family psychiatric history from progress note on  07/09/2019.  Family History:  Family History  Problem Relation Age of Onset   Breast cancer Mother 24   Anxiety disorder Mother    Breast cancer Maternal Aunt 69   Anxiety disorder Sister    Panic disorder Sister    Dementia Maternal Grandmother    Dementia Paternal Grandmother     Social History: I have reviewed social history from progress note on 07/09/2019. Social History   Socioeconomic History   Marital status: Married    Spouse name: jon   Number of children: 0   Years of education: Not on file   Highest education level: Bachelor's degree (e.g., BA, AB, BS)  Occupational History   Not on file  Tobacco Use   Smoking status: Never   Smokeless tobacco: Never  Vaping Use   Vaping status: Never Used  Substance and Sexual Activity   Alcohol use: Never   Drug use: Never   Sexual activity: Not on file  Other Topics Concern   Not on file  Social History Narrative   Not on file   Social Determinants of Health   Financial Resource Strain: Low Risk  (07/09/2019)   Overall Financial Resource Strain (CARDIA)    Difficulty of Paying Living Expenses: Not hard at all  Food Insecurity: No Food Insecurity (07/09/2019)   Hunger Vital Sign    Worried About Running Out of Food in the Last Year: Never true    Ran Out of Food in the Last Year: Never true  Transportation Needs: No Transportation Needs (07/09/2019)   PRAPARE - Administrator, Civil Service (Medical): No    Lack of Transportation (Non-Medical): No  Physical Activity: Inactive (07/09/2019)   Exercise Vital Sign    Days of Exercise per Week: 0 days    Minutes of Exercise per Session: 0 min  Stress: Stress Concern Present (07/09/2019)   [employer] of Occupational Health - Occupational Stress Questionnaire    Feeling of Stress : Rather much  Social Connections: Unknown (07/09/2019)   Social Connection and Isolation Panel [NHANES]    Frequency of Communication with Friends and Family: Not on file     Frequency of Social Gatherings with Friends and Family: Not on file    Attends Religious Services: Never    Active Member of Clubs or Organizations: Yes    Attends Banker Meetings: More than 4 times per year    Marital Status: Married    Allergies:  Allergies  Allergen Reactions   Adbry [Tralokinumab-Ldrm] Rash   Atomoxetine Other (See Comments)    Heartburn   Guanfacine Other (See Comments)    drowsiness   Chlorpheniramine-Pseudoeph     Hyperactive   Dupilumab Other (See Comments)    Injection reaction but tolerable per patient   Pseudoephedrine Other (See Comments)    Hyperactive Other reaction(s): insomnia, shakey   Shellfish Allergy Other (See Comments)   Hydrocodone Itching   Prednisone Anxiety    insomnia    Metabolic Disorder Labs: No results  found for: "HGBA1C", "MPG" No results found for: "PROLACTIN" Lab Results  Component Value Date   CHOL 173 02/06/2023   TRIG 94 02/06/2023   HDL 48 02/06/2023   CHOLHDL 3.6 02/06/2023   LDLCALC 108 (H) 02/06/2023   No results found for: "TSH"  Therapeutic Level Labs: No results found for: "LITHIUM" No results found for: "VALPROATE" No results found for: "CBMZ"  Current Medications: Current Outpatient Medications  Medication Sig Dispense Refill   albuterol (VENTOLIN HFA) 108 (90 Base) MCG/ACT inhaler Inhale 1-2 puffs into the lungs every 4 (four) hours as needed for wheezing or shortness of breath. 1 each 0   azelastine (ASTELIN) 0.1 % nasal spray azelastine 137 mcg (0.1 %) nasal spray aerosol     Cetirizine HCl 10 MG CAPS Take by mouth.     ciclopirox (LOPROX) 0.77 % cream Apply to the feet and between the toes BID x 4 weeks. 90 g 0   clobetasol (TEMOVATE) 0.05 % external solution Mix with CeraVe cream and use as directed. Avoid applying to face, groin, and axilla. 50 mL 2   clobetasol cream (TEMOVATE) 0.05 % Apply to affected areas rash on hands and foot twice daily for flares until improved. Avoid face,  groin, underarms. 60 g 1   Crisaborole (EUCRISA) 2 % OINT APPLY TO AFFECTED AREAS HANDS AND FEET 1-2 TIMES A DAY FOR MAINTENANCE. 60 g 2   cyclobenzaprine (FLEXERIL) 5 MG tablet Take by mouth.     Diclofenac Sodium (PENNSAID) 2 % SOLN      Dupilumab (DUPIXENT) 300 MG/2ML SOPN Inject 300 mg into the skin every 14 (fourteen) days. Starting at day 15 for maintenance. 4 mL 5   EPINEPHrine 0.3 mg/0.3 mL IJ SOAJ injection See admin instructions.     gabapentin (NEURONTIN) 300 MG capsule Take 600 mg by mouth 3 (three) times daily. Takes differently     hydrOXYzine (VISTARIL) 50 MG capsule TAKE 1 CAPSULE (50 MG TOTAL) BY MOUTH 2 (TWO) TIMES DAILY AS NEEDED. 180 capsule 1   ibuprofen (ADVIL) 600 MG tablet Take 1 tablet (600 mg total) by mouth every 6 (six) hours as needed. 30 tablet 0   JORNAY PM 60 MG CP24 Take 1 capsule by mouth at bedtime.     losartan (COZAAR) 25 MG tablet Take 1 tablet by mouth daily.     mupirocin ointment (BACTROBAN) 2 % Apply 1 Application topically daily. 22 g 0   Na Sulfate-K Sulfate-Mg Sulf 17.5-3.13-1.6 GM/177ML SOLN Take 1 Bottle by mouth as directed. (Patient not taking: Reported on 08/22/2023)     Spacer/Aero-Holding Chambers (AEROCHAMBER MV) inhaler Use as instructed 1 each 1   spironolactone (ALDACTONE) 50 MG tablet TAKE 1-2 TABLETS BY MOUTH EVERY DAY FOR ACNE. 180 tablet 1   SUMAtriptan (IMITREX) 50 MG tablet Take by mouth.     tacrolimus (PROTOPIC) 0.1 % ointment Apply topically as directed. Qd to bid to aa hands, right foot until clear, then prn flares 100 g 2   triamcinolone (NASACORT) 55 MCG/ACT AERO nasal inhaler Nasal Allergy 55 mcg spray aerosol  2 SPRAYS ONCE A DAY NASALLY 30 DAYS     venlafaxine XR (EFFEXOR XR) 75 MG 24 hr capsule Take 1 capsule (75 mg total) by mouth daily with breakfast. 90 capsule 1   VITAMIN D PO Take by mouth.     zolpidem (AMBIEN) 5 MG tablet Take 1-1.5 tablets (5-7.5 mg total) by mouth at bedtime as needed for sleep. 45 tablet 1   No  current facility-administered medications for this visit.     Musculoskeletal: Strength & Muscle Tone:  UTA Gait & Station:  Seated Patient leans: N/A  Psychiatric Specialty Exam: Review of Systems  Psychiatric/Behavioral:  The patient is nervous/anxious.     There were no vitals taken for this visit.There is no height or weight on file to calculate BMI.  General Appearance: Fairly Groomed  Eye Contact:  Fair  Speech:  Clear and Coherent  Volume:  Normal  Mood:  Anxious  Affect:  Congruent  Thought Process:  Goal Directed and Descriptions of Associations: Intact  Orientation:  Full (Time, Place, and Person)  Thought Content: Logical   Suicidal Thoughts:  No  Homicidal Thoughts:  No  Memory:  Immediate;   Fair Recent;   Fair Remote;   Fair  Judgement:  Fair  Insight:  Fair  Psychomotor Activity:  Normal  Concentration:  Concentration: Fair and Attention Span: Fair  Recall:  [employer] of Knowledge: Fair  Language: Fair  Akathisia:  No  Handed:  Right  AIMS (if indicated): not done  Assets:  Communication Skills Desire for Improvement Housing Intimacy Social Support Transportation  ADL's:  Intact  Cognition: WNL  Sleep:  Fair   Screenings: AIMS    Flowsheet Row Video Visit from 06/19/2022 in [facility]  AIMS Total Score 0      GAD-7    Flowsheet Row Office Visit from 07/25/2023 in [facility] Office Visit from 04/26/2023 in [facility] Video Visit from 06/19/2022 in [facility] Office Visit from 12/29/2021 in [facility]  Total GAD-7 Score 15 13 7 11       PHQ2-9    Flowsheet Row Office Visit from 07/25/2023 in [facility] Office Visit from 04/26/2023 in [facility] Video Visit  from 08/15/2022 in [facility] Video Visit from 06/19/2022 in [facility] Office Visit from 12/29/2021 in [facility]  PHQ-2 Total Score 2 2 0 0 0  PHQ-9 Total Score 15 17 -- -- --      Flowsheet Row Video Visit from 10/17/2023 in [facility] Office Visit from 08/22/2023 in [facility] Office Visit from 07/25/2023 in [facility]  C-SSRS RISK CATEGORY No Risk No Risk No Risk        Assessment and Plan: Sara Suarez is a 47 year old Caucasian female, employed, lives in [place], married, has a history of GAD, insomnia was evaluated by telemedicine today.  Patient with current situational stressors which contributes to anxiety, will benefit from the following plan.    Generalized Anxiety Disorder-unstable Ongoing anxiety exacerbated by recent stressors, including pet loss and work environment. Currently on venlafaxine, increased eight weeks ago, with no significant side effects. Uncertain if symptoms are situational or require further medication adjustment. Discussed increasing venlafaxine versus addressing situational stressors through therapy. Prefers to contact therapist Huey Romans and monitor symptoms before considering medication adjustment. - Contact therapist Huey Romans for sessions - Monitor symptoms and consider increasing venlafaxine dosage if anxiety persists - Continue venlafaxine extended release 75 mg p.o. daily. - Hydroxyzine 50 mg twice a day as needed. - Follow up in two months  Depression-improving - Continue venlafaxine extended release 75 mg p.o. daily.  Insomnia-improving -  Continue Ambien 7.5 mg p.o. nightly as needed - Patient to continue CPAP.   Attention-Deficit/Hyperactivity Disorder (ADHD)-improving Currently on 60  mg of medication for ADHD. Reports mixed results with focus but no significant side effects. Decision made to continue current dosage and reassess in one month. - Continue current ADHD medication at 60 mg - Continue follow-up with [place] attention specialist.   Chronic Pain - Nerve pain in lumbar region affecting daily activities and contributing to emotional stress. Scheduled for injection at [facility] under Dr. Mariah Milling. Pain management may improve emotional well-being. - Proceed with scheduled lumbar injection at [facility] - Monitor pain levels post-injection  Follow-up - Schedule follow-up video visit on February 10th at 3:30 PM - Patient to reach out if anxiety symptoms worsen before next visit.  Collaboration of Care: Collaboration of Care: Referral or follow-up with counselor/therapist AEB patient encouraged to schedule an appointment with therapist.  Patient/Guardian was advised Release of Information must be obtained prior to any record release in order to collaborate their care with an outside provider. Patient/Guardian was advised if they have not already done so to contact the registration department to sign all necessary forms in order for [place] to release information regarding their care.   Consent: Patient/Guardian gives verbal consent for treatment and assignment of benefits for services provided during this visit. Patient/Guardian expressed understanding and agreed to proceed.   This note was generated in part or whole with voice recognition software. Voice recognition is usually quite accurate but there are transcription errors that can and very often do occur. I apologize for any typographical errors that were not detected and corrected.    Jomarie Longs, MD 10/19/2023, 7:46 AM

## 2023-10-30 ENCOUNTER — Other Ambulatory Visit: Payer: Self-pay | Admitting: Dermatology

## 2023-12-24 ENCOUNTER — Encounter: Payer: Self-pay | Admitting: Psychiatry

## 2023-12-24 ENCOUNTER — Telehealth (INDEPENDENT_AMBULATORY_CARE_PROVIDER_SITE_OTHER): Payer: Self-pay | Admitting: Psychiatry

## 2023-12-24 DIAGNOSIS — F411 Generalized anxiety disorder: Secondary | ICD-10-CM

## 2023-12-24 DIAGNOSIS — F5105 Insomnia due to other mental disorder: Secondary | ICD-10-CM

## 2023-12-24 DIAGNOSIS — F3289 Other specified depressive episodes: Secondary | ICD-10-CM

## 2023-12-24 DIAGNOSIS — F988 Other specified behavioral and emotional disorders with onset usually occurring in childhood and adolescence: Secondary | ICD-10-CM

## 2023-12-24 MED ORDER — VENLAFAXINE HCL ER 37.5 MG PO CP24: 37.5000 mg | ORAL_CAPSULE | Freq: Every day | ORAL | 1 refills | Status: DC

## 2023-12-24 NOTE — Progress Notes (Signed)
 Virtual Visit via Video Note  I connected with Sara Suarez on 12/24/23 at  3:30 PM EST by a video enabled telemedicine application and verified that I am speaking with the correct person using two identifiers.  Location Provider Location : [facility] Patient Location : Home  Participants: Patient , Provider   I discussed the limitations of evaluation and management by telemedicine and the availability of in person appointments. The patient expressed understanding and agreed to proceed.    I discussed the assessment and treatment plan with the patient. The patient was provided an opportunity to ask questions and all were answered. The patient agreed with the plan and demonstrated an understanding of the instructions.   The patient was advised to call back or seek an in-person evaluation if the symptoms worsen or if the condition fails to improve as anticipated.   BH MD OP Progress Note  12/24/2023 4:00 PM Dara Volkers  MRN:  [id number]  Chief Complaint:  Chief Complaint  Patient presents with   Follow-up   Anxiety   Depression   Medication Refill   HPI: Sara Suarez is a 48 year old Caucasian female, lives in [place], has a history of GAD, insomnia, ADHD, psoriasis, atopic dermatitis was evaluated by telemedicine today.  The patient presents with anxiety and organizational difficulties due to ADHD symptoms.  She feels overwhelmed and paralyzed by her inability to initiate tasks, attributing this to anxiety. She has difficulty making decisions about task prioritization, leading to trouble getting started. Once a task is initiated, she can complete it. Her home is disorganized, with tables piled with papers and books, and she finds it challenging to create a new organizational system. She also gets easily distracted when attempting to organize.  She has not spoken to her therapist, Genoveva Kidney, in a while but wants to reconnect. Lita Rieger has changed companies, and she  needs to create a login to book appointments. She hesitates to reach out due to not wanting to report her struggles.  She is currently taking venlafaxine  75 mg and is considering an increase in dosage. She is also on a stimulant medication, Journey, at 60 mg. She is aware of the potential for serotonin syndrome and has researched its symptoms.  She mentions being out and about, trying to go on walks now that the weather is cooler. She notes difficulty with heat. She describes experiencing racing pulse and excessive sweating in hot weather, as noted during a recent trip to [place].  Denies significant depression symptoms or sleep problems, attributing good sleep to physical activity during her trip.  Denies thoughts of self-harm .  Denies any homicidality or perceptual disturbances.    Visit Diagnosis:    ICD-10-CM   1. Generalized anxiety disorder  F41.1 venlafaxine  XR (EFFEXOR -XR) 37.5 MG 24 hr capsule    2. Insomnia due to mental condition  F51.05    Mood    3. 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 Attention deficit disorder (ADD) in adult  F98.8       Past Psychiatric History: I have reviewed past psychiatric history from progress note on 07/09/2019.  Past trials of Cymbalta , Klonopin, Ambien , BuSpar   Past Medical History:  Past Medical History:  Diagnosis Date   ADHD (attention deficit hyperactivity disorder)    Allergy    Anxiety    Depression     Past Surgical History:  Procedure Laterality Date   BREAST CYST ASPIRATION Right  neg   ENDOSCOPIC PLANTAR FASCIOTOMY     FOOT SURGERY     MOUTH SURGERY     TUBAL LIGATION      Family Psychiatric History: I have reviewed family psychiatric history from progress note on 07/09/2019  Family History:  Family History  Problem Relation Age of Onset   Breast cancer Mother 5   Anxiety disorder Mother    Breast cancer Maternal Aunt 55   Anxiety disorder Sister     Panic disorder Sister    Dementia Maternal Grandmother    Dementia Paternal Grandmother     Social History: I have reviewed social history from progress note on 07/09/2019 Social History   Socioeconomic History   Marital status: Married    Spouse name: jon   Number of children: 0   Years of education: Not on file   Highest education level: Bachelor's degree (e.g., BA, AB, BS)  Occupational History   Not on file  Tobacco Use   Smoking status: Never   Smokeless tobacco: Never  Vaping Use   Vaping status: Never Used  Substance and Sexual Activity   Alcohol use: Never   Drug use: Never   Sexual activity: Not on file  Other Topics Concern   Not on file  Social History Narrative   Not on file   Social Drivers of Health   Financial Resource Strain: Low Risk  (07/09/2019)   Overall Financial Resource Strain (CARDIA)    Difficulty of Paying Living Expenses: Not hard at all  Food Insecurity: No Food Insecurity (07/09/2019)   Hunger Vital Sign    Worried About Running Out of Food in the Last Year: Never true    Ran Out of Food in the Last Year: Never true  Transportation Needs: No Transportation Needs (07/09/2019)   PRAPARE - Administrator, Civil Service (Medical): No    Lack of Transportation (Non-Medical): No  Physical Activity: Inactive (07/09/2019)   Exercise Vital Sign    Days of Exercise per Week: 0 days    Minutes of Exercise per Session: 0 min  Stress: Stress Concern Present (07/09/2019)   [employer] of Occupational Health - Occupational Stress Questionnaire    Feeling of Stress : Rather much  Social Connections: Unknown (07/09/2019)   Social Connection and Isolation Panel [NHANES]    Frequency of Communication with Friends and Family: Not on file    Frequency of Social Gatherings with Friends and Family: Not on file    Attends Religious Services: Never    Active Member of Clubs or Organizations: Yes    Attends Banker Meetings: More  than 4 times per year    Marital Status: Married    Allergies:  Allergies  Allergen Reactions   Adbry  [Tralokinumab -Ldrm] Rash   Atomoxetine Other (See Comments)    Heartburn   Guanfacine Other (See Comments)    drowsiness   Chlorpheniramine-Pseudoeph     Hyperactive   Dupilumab  Other (See Comments)    Injection reaction but tolerable per patient   Pseudoephedrine Other (See Comments)    Hyperactive Other reaction(s): insomnia, shakey   Shellfish Allergy Other (See Comments)   Hydrocodone Itching   Prednisone  Anxiety    insomnia    Metabolic Disorder Labs: No results found for: "HGBA1C", "MPG" No results found for: "PROLACTIN" Lab Results  Component Value Date   CHOL 173 02/06/2023   TRIG 94 02/06/2023   HDL 48 02/06/2023   CHOLHDL 3.6 02/06/2023   LDLCALC 108 (H)  02/06/2023   No results found for: "TSH"  Therapeutic Level Labs: No results found for: "LITHIUM" No results found for: "VALPROATE" No results found for: "CBMZ"  Current Medications: Current Outpatient Medications  Medication Sig Dispense Refill   atovaquone-proguanil (MALARONE) 250-100 MG TABS tablet Take by mouth.     venlafaxine  XR (EFFEXOR -XR) 37.5 MG 24 hr capsule Take 1 capsule (37.5 mg total) by mouth daily with breakfast. Take along with 75 mg daily 30 capsule 1   albuterol  (VENTOLIN  HFA) 108 (90 Base) MCG/ACT inhaler Inhale 1-2 puffs into the lungs every 4 (four) hours as needed for wheezing or shortness of breath. 1 each 0   azelastine (ASTELIN) 0.1 % nasal spray azelastine 137 mcg (0.1 %) nasal spray aerosol     Cetirizine HCl 10 MG CAPS Take by mouth.     ciclopirox  (LOPROX ) 0.77 % cream Apply to the feet and between the toes BID x 4 weeks. 90 g 0   clobetasol  (TEMOVATE ) 0.05 % external solution Mix with CeraVe cream and use as directed. Avoid applying to face, groin, and axilla. 50 mL 2   clobetasol  cream (TEMOVATE ) 0.05 % Apply to affected areas rash on hands and foot twice daily for flares  until improved. Avoid face, groin, underarms. 60 g 1   Crisaborole  (EUCRISA ) 2 % OINT APPLY TO AFFECTED AREAS HANDS AND FEET 1-2 TIMES A DAY FOR MAINTENANCE. 60 g 2   cyclobenzaprine (FLEXERIL) 5 MG tablet Take by mouth.     Diclofenac  Sodium (PENNSAID ) 2 % SOLN      DUPIXENT  300 MG/2ML SOAJ INJECT 1 PEN SUBCUTANEOUSLY  EVERY OTHER WEEK 4 mL 5   EPINEPHrine 0.3 mg/0.3 mL IJ SOAJ injection See admin instructions.     gabapentin (NEURONTIN) 300 MG capsule Take 600 mg by mouth 3 (three) times daily. Takes differently     hydrOXYzine  (VISTARIL ) 50 MG capsule TAKE 1 CAPSULE (50 MG TOTAL) BY MOUTH 2 (TWO) TIMES DAILY AS NEEDED. 180 capsule 1   ibuprofen  (ADVIL ) 600 MG tablet Take 1 tablet (600 mg total) by mouth every 6 (six) hours as needed. 30 tablet 0   JORNAY PM 60 MG CP24 Take 1 capsule by mouth at bedtime.     losartan (COZAAR) 25 MG tablet Take 1 tablet by mouth daily.     mupirocin  ointment (BACTROBAN ) 2 % Apply 1 Application topically daily. 22 g 0   Na Sulfate-K Sulfate-Mg Sulf 17.5-3.13-1.6 GM/177ML SOLN Take 1 Bottle by mouth as directed. (Patient not taking: Reported on 08/22/2023)     Spacer/Aero-Holding Chambers (AEROCHAMBER MV) inhaler Use as instructed 1 each 1   spironolactone  (ALDACTONE ) 50 MG tablet TAKE 1-2 TABLETS BY MOUTH EVERY DAY FOR ACNE. 180 tablet 1   SUMAtriptan (IMITREX) 50 MG tablet Take by mouth.     tacrolimus  (PROTOPIC ) 0.1 % ointment Apply topically as directed. Qd to bid to aa hands, right foot until clear, then prn flares 100 g 2   triamcinolone  (NASACORT ) 55 MCG/ACT AERO nasal inhaler Nasal Allergy 55 mcg spray aerosol  2 SPRAYS ONCE A DAY NASALLY 30 DAYS     venlafaxine  XR (EFFEXOR  XR) 75 MG 24 hr capsule Take 1 capsule (75 mg total) by mouth daily with breakfast. 90 capsule 1   VITAMIN D PO Take by mouth.     zolpidem  (AMBIEN ) 5 MG tablet Take 1-1.5 tablets (5-7.5 mg total) by mouth at bedtime as needed for sleep. 45 tablet 1   No current facility-administered  medications for this  visit.     Musculoskeletal: Strength & Muscle Tone:  UTA Gait & Station:  Seated Patient leans: N/A  Psychiatric Specialty Exam: Review of Systems  Psychiatric/Behavioral:  Positive for decreased concentration. The patient is nervous/anxious.     There were no vitals taken for this visit.There is no height or weight on file to calculate BMI.  General Appearance: Casual  Eye Contact:  Fair  Speech:  Clear and Coherent  Volume:  Normal  Mood:  Anxious  Affect:  Appropriate  Thought Process:  Goal Directed and Descriptions of Associations: Intact  Orientation:  Full (Time, Place, and Person)  Thought Content: Logical   Suicidal Thoughts:  No  Homicidal Thoughts:  No  Memory:  Immediate;   Fair Recent;   Fair Remote;   Fair  Judgement:  Fair  Insight:  Fair  Psychomotor Activity:  Normal  Concentration:  Concentration: Fair and Attention Span: Fair  Recall:  [employer] of Knowledge: Fair  Language: Fair  Akathisia:  No  Handed:  Right  AIMS (if indicated): not done  Assets:  Desire for Improvement Housing Social Support  ADL's:  Intact  Cognition: WNL  Sleep:  Fair   Screenings: AIMS    Flowsheet Row Video Visit from 06/19/2022 in [facility]  AIMS Total Score 0      GAD-7    Flowsheet Row Office Visit from 07/25/2023 in [facility] Office Visit from 04/26/2023 in [facility] Video Visit from 06/19/2022 in [facility] Office Visit from 12/29/2021 in [facility]  Total GAD-7 Score 15 13 7 11       PHQ2-9    Flowsheet Row Office Visit from 07/25/2023 in [facility] Office Visit from 04/26/2023 in [facility] Video Visit from 08/15/2022 in [facility] Video Visit from 06/19/2022 in [facility] Office Visit from 12/29/2021 in [facility]  PHQ-2 Total Score 2 2 0 0 0  PHQ-9 Total Score 15 17 -- -- --      Flowsheet Row Video Visit from 12/24/2023 in [facility] Video Visit from 10/17/2023 in [facility] Office Visit from 08/22/2023 in [facility]  C-SSRS RISK CATEGORY No Risk No Risk No Risk        Assessment and Plan: Sara Suarez is a 48 year old Caucasian female, employed, lives in [place], married, has a history of GAD, insomnia was evaluated by telemedicine today.  Discussed assessment and plan as noted below.  General anxiety disorder-unstable Priyanka reports feeling overwhelmed and paralyzed by decision-making and task initiation, contributing to her anxiety. She describes difficulty with organization and a cluttered living environment. Symptoms may be related to ADHD. Cognitive behavioral therapy (CBT) recommended. Discussed increasing venlafaxine  dosage to manage anxiety symptoms, including potential benefits and risks such as serotonin syndrome, muscle spasms, rigidity, and hallucinations.  - Continue psychotherapy with Ms. Leoma Raja, therapist, has been noncompliant. - Increase Venlafaxine  extended release to 112.5 mg daily - Continue Hydroxyzine  50 mg twice a day as needed  Depression-in remission Sakai reports chronic depression with past episodes of severe depression and suicidal ideation. Currently, she does not have significant depression symptoms or suicidal thoughts .No current suicidal ideation. Discussed the importance of monitoring for any  changes in symptoms.   - Continue Venlafaxine  as prescribed.  Insomnia-stable Currently reports sleep is good. - Continue Ambien  7.5 mg at bedtime as  needed - Continue CPAP  ADHD-unstable Keysi exhibits symptoms of ADHD, including difficulty with organization, distractibility, and trouble starting tasks. Emphasized the importance of CBT to develop better organizational skills and manage ADHD symptoms. Recommended reading materials and discussing strategies with Dr. Jerona Mooring.   Currently under the care of [place] attention specialist. - Continue Garen Juneau as prescribed. - Continue follow-up with [place] attention specialist.  Follow-up - Follow-up appointment on March 18th at 8:30 AM   - Ensure at least a couple of sessions with Leoma Raja before the follow-up appointment.  Collaboration of Care: Collaboration of Care: Referral or follow-up with counselor/therapist AEB patient encouraged to continue CBT  Patient/Guardian was advised Release of Information must be obtained prior to any record release in order to collaborate their care with an outside provider. Patient/Guardian was advised if they have not already done so to contact the registration department to sign all necessary forms in order for us  to release information regarding their care.   Consent: Patient/Guardian gives verbal consent for treatment and assignment of benefits for services provided during this visit. Patient/Guardian expressed understanding and agreed to proceed.   This note was generated in part or whole with voice recognition software. Voice recognition is usually quite accurate but there are transcription errors that can and very often do occur. I apologize for any typographical errors that were not detected and corrected.    Frederico Gerling, MD 12/24/2023, 4:00 PM

## 2023-12-25 ENCOUNTER — Ambulatory Visit: Payer: 59 | Admitting: Dermatology

## 2024-01-04 ENCOUNTER — Other Ambulatory Visit: Payer: Self-pay | Admitting: Gerontology

## 2024-01-04 DIAGNOSIS — R7303 Prediabetes: Secondary | ICD-10-CM

## 2024-01-04 DIAGNOSIS — G4733 Obstructive sleep apnea (adult) (pediatric): Secondary | ICD-10-CM

## 2024-01-04 DIAGNOSIS — E669 Obesity, unspecified: Secondary | ICD-10-CM

## 2024-01-04 DIAGNOSIS — I10 Essential (primary) hypertension: Secondary | ICD-10-CM

## 2024-01-17 ENCOUNTER — Ambulatory Visit
Admission: RE | Admit: 2024-01-17 | Discharge: 2024-01-17 | Disposition: A | Discharge status: Home / Self Care | Payer: Self-pay | Source: Ambulatory Visit | Attending: Gerontology | Admitting: Gerontology

## 2024-01-17 DIAGNOSIS — R7303 Prediabetes: Secondary | ICD-10-CM | POA: Insufficient documentation

## 2024-01-17 DIAGNOSIS — I10 Essential (primary) hypertension: Secondary | ICD-10-CM | POA: Insufficient documentation

## 2024-01-17 DIAGNOSIS — G4733 Obstructive sleep apnea (adult) (pediatric): Secondary | ICD-10-CM | POA: Insufficient documentation

## 2024-01-17 DIAGNOSIS — E669 Obesity, unspecified: Secondary | ICD-10-CM | POA: Insufficient documentation

## 2024-01-29 ENCOUNTER — Encounter: Payer: Self-pay | Admitting: Psychiatry

## 2024-01-29 ENCOUNTER — Telehealth (INDEPENDENT_AMBULATORY_CARE_PROVIDER_SITE_OTHER): Payer: Self-pay | Admitting: Psychiatry

## 2024-01-29 DIAGNOSIS — F988 Other specified behavioral and emotional disorders with onset usually occurring in childhood and adolescence: Secondary | ICD-10-CM

## 2024-01-29 DIAGNOSIS — F321 Major depressive disorder, single episode, moderate: Secondary | ICD-10-CM

## 2024-01-29 DIAGNOSIS — F411 Generalized anxiety disorder: Secondary | ICD-10-CM

## 2024-01-29 DIAGNOSIS — F39 Unspecified mood [affective] disorder: Secondary | ICD-10-CM

## 2024-01-29 DIAGNOSIS — G4701 Insomnia due to medical condition: Secondary | ICD-10-CM

## 2024-01-29 DIAGNOSIS — Z9189 Other specified personal risk factors, not elsewhere classified: Secondary | ICD-10-CM | POA: Insufficient documentation

## 2024-01-29 NOTE — Patient Instructions (Addendum)
 Please call for EKG - [phone] Aripiprazole Tablets What is this medication? ARIPIPRAZOLE (ay ri PIP ray zole) treats schizophrenia, bipolar I disorder, autism spectrum disorder, and Tourette disorder. It may also be used with antidepressant medications to treat depression. It works by balancing the levels of dopamine and serotonin in the brain, hormones that help regulate mood, behaviors, and thoughts. It belongs to a group of medications called antipsychotics. Antipsychotics can be used to treat several kinds of mental health conditions. This medicine may be used for other purposes; ask your health care provider or pharmacist if you have questions. COMMON BRAND NAME(S): Abilify What should I tell my care team before I take this medication? They need to know if you have any of these conditions: Dementia Diabetes Difficulty swallowing Have trouble controlling your muscles Heart disease History of irregular heartbeat History of stroke Low blood cell levels (white cells, red cells, and platelets) Low blood pressure Parkinson disease Seizures Suicidal thoughts, plans, or attempt by you or a family member Urges to engage in impulsive behaviors in ways that are unusual for you An unusual or allergic reaction to aripiprazole, other medications, foods, dyes, or preservatives Pregnant or trying to get pregnant Breastfeeding How should I use this medication? Take this medication by mouth with a glass of water. Take it as directed on the prescription label at the same time every day. You can take it with or without food. If it upsets your stomach, take it with food. Do not take your medication more often than directed. Keep taking it unless your care team tells you to stop. A special MedGuide will be given to you by the pharmacist with each prescription and refill. Be sure to read this information carefully each time. Talk to your care team about the use of this medication in children. While it  may be prescribed for children as young as 6 years for selected conditions, precautions do apply. Overdosage: If you think you have taken too much of this medicine contact a poison control center or emergency room at once. NOTE: This medicine is only for you. Do not share this medicine with others. What if I miss a dose? If you miss a dose, take it as soon as you can. If it is almost time for your next dose, take only that dose. Do not take double or extra doses. What may interact with this medication? Do not take this medication with any of the following: Brexpiprazole Cisapride Dextromethorphan; quinidine Dronedarone Metoclopramide Pimozide Quinidine Thioridazine This medication may also interact with the following: Antihistamines for allergy, cough, and cold Carbamazepine Certain medications for anxiety or sleep Certain medications for depression, such as amitriptyline, fluoxetine, paroxetine, or sertraline Certain medications for fungal infections, such as fluconazole, itraconazole, ketoconazole, posaconazole, or voriconazole Clarithromycin General anesthetics, such as halothane, isoflurane, methoxyflurane, or propofol Medications for Parkinson disease, such as levodopa Medications for blood pressure Medications for seizures Medications that relax muscles for surgery Opioid medications for pain Other medications that cause heart rhythm changes Phenothiazines, such as chlorpromazine or prochlorperazine Rifampin This list may not describe all possible interactions. Give your health care provider a list of all the medicines, herbs, non-prescription drugs, or dietary supplements you use. Also tell them if you smoke, drink alcohol, or use illegal drugs. Some items may interact with your medicine. What should I watch for while using this medication? Visit your care team for regular checks on your progress. Tell your care team if your symptoms do not start to get  better or if they get  worse. Do not suddenly stop taking this medication. You may develop a severe reaction. Your care team will tell you how much medication to take. If your care team wants you to stop the medication, the dose may be slowly lowered over time to avoid any side effects. Patients and their families should watch out for new or worsening depression or thoughts of suicide. Also watch out for sudden changes in feelings such as feeling anxious, agitated, panicky, irritable, hostile, aggressive, impulsive, severely restless, overly excited and hyperactive, or not being able to sleep. If this happens, especially at the beginning of antidepressant treatment or after a change in dose, call your care team. This medication may affect your coordination, reaction time, or judgment. Do not drive or operate machinery until you know how this medication affects you. Sit up or stand slowly to reduce the risk of dizzy or fainting spells. Drinking alcohol with this medication can increase the risk of these side effects. This medication can cause problems with controlling your body temperature. It can lower the response of your body to cold temperatures. If possible, stay indoors during cold weather. If you must go outdoors, wear warm clothes. It can also lower the response of your body to heat. Do not overheat. Do not over-exercise. Stay out of the sun when possible. If you must be in the sun, wear cool clothing. Drink plenty of water. If you have trouble controlling your body temperature, call your care team right away. This medication may cause dry eyes and blurred vision. If you wear contact lenses, you may feel some discomfort. Lubricating eye drops may help. See your care team if the problem does not go away or is severe. This medication may increase blood sugar. Ask your care team if changes in diet or medications are needed if you have diabetes. There have been reports of increased sexual urges or other strong urges such as  gambling while taking this medication. If you experience any of these while taking this medication, you should report this to your care team as soon as possible. What side effects may I notice from receiving this medication? Side effects that you should report to your care team as soon as possible: Allergic reactions--skin rash, itching, hives, swelling of the face, lips, tongue, or throat High blood sugar (hyperglycemia)--increased thirst or amount of urine, unusual weakness or fatigue, blurry vision High fever, stiff muscles, increased sweating, fast or irregular heartbeat, and confusion, which may be signs of neuroleptic malignant syndrome Low blood pressure--dizziness, feeling faint or lightheaded, blurry vision Pain or trouble swallowing Prolonged or painful erection Seizures Stroke--sudden numbness or weakness of the face, arm, or leg, trouble speaking, confusion, trouble walking, loss of balance or coordination, dizziness, severe headache, change in vision Uncontrolled and repetitive body movements, muscle stiffness or spasms, tremors or shaking, loss of balance or coordination, restlessness, shuffling walk, which may be signs of extrapyramidal symptoms (EPS) Thoughts of suicide or self-harm, worsening mood, feelings of depression Urges to engage in impulsive behaviors such as gambling, binge eating, sexual activity, or shopping in ways that are unusual for you Side effects that usually do not require medical attention (report these to your care team if they continue or are bothersome): Constipation Drowsiness Weight gain This list may not describe all possible side effects. Call your doctor for medical advice about side effects. You may report side effects to FDA at 1-800-FDA-1088. Where should I keep my medication? Keep out of the reach  of children and pets. Store at room temperature between 15 and 30 degrees C (59 and 86 degrees F). Throw away any unused medication after the expiration  date. NOTE: This sheet is a summary. It may not cover all possible information. If you have questions about this medicine, talk to your doctor, pharmacist, or health care provider.  2024 [employer] Standard (2022-05-20 00:00:00)Brexpiprazole Tablets What is this medication? BREXPIPRAZOLE (brex PIP ray zole) treats schizophrenia. It may also be used with antidepressant medication to treat depression. It can also be used to treat agitation caused by Alzheimer disease. It works by balancing the levels of dopamine and serotonin in your brain, substances that help regulate mood, behaviors, and thoughts. It belongs to a group of medications called antipsychotics. Antipsychotic medications can be used to treat several kinds of mental health conditions. This medicine may be used for other purposes; ask your health care provider or pharmacist if you have questions. COMMON BRAND NAME(S): REXULTI What should I tell my care team before I take this medication? They need to know if you have any of these conditions: Dementia Diabetes Have trouble controlling your muscles Heart disease High cholesterol History of breast cancer History of stroke Kidney disease Liver disease Low blood cell levels (white cells, red cells, and platelets) Low blood pressure Parkinson disease Seizures Suicidal thoughts, plans, or attempt by you or a family member Trouble swallowing Urges to engage in impulsive behaviors in ways that are unusual for you An unusual or allergic reaction to brexpiprazole, other medications, foods, dyes, or preservatives Pregnant or trying to get pregnant Breastfeeding How should I use this medication? Take this medication by mouth with water. Take it as directed on the prescription label at the same time every day. You can take it with or without food. If it upsets your stomach, take it with food. Keep taking this medication unless your care team tells you to stop. Stopping it too quickly can  cause serious side effects. It can also make your condition worse. A special MedGuide will be given to you by the pharmacist with each prescription and refill. Be sure to read this information carefully each time. Talk to your care team about the use of this medication in children. While it may be prescribed for children as young as 13 years for selected conditions, precautions do apply. Overdosage: If you think you have taken too much of this medicine contact a poison control center or emergency room at once. NOTE: This medicine is only for you. Do not share this medicine with others. What if I miss a dose? If you miss a dose, take it as soon as you can. If it is almost time for your next dose, take only that dose. Do not take double or extra doses. What may interact with this medication? Do not take this medication with any of the following: Aripiprazole Metoclopramide This medication may also interact with the following: Antihistamines for allergy, cough, and cold Certain medications for anxiety or sleep Certain medications for depression, such as amitriptyline, duloxetine, fluoxetine, paroxetine, sertraline Certain medications for fungal infections, such as fluconazole, itraconazole, ketoconazole Certain medications for Parkinson disease, such as levodopa Clarithromycin General anesthetics, such as halothane, isoflurane, methoxyflurane, propofol Medications for blood pressure Medications that relax muscles for surgery Medications for seizures Opioid medications for pain Phenothiazines, such as chlorpromazine, prochlorperazine, thioridazine Quinidine Rifampin St. John's wort This list may not describe all possible interactions. Give your health care provider a list of all the medicines, herbs, non-prescription  drugs, or dietary supplements you use. Also tell them if you smoke, drink alcohol, or use illegal drugs. Some items may interact with your medicine. What should I watch for while  using this medication? Visit your care team for regular checks on your progress. Tell your care team if your symptoms do not start to get better or if they get worse. Do not suddenly stop taking this medication. You may develop a severe reaction. Your care team will tell you how much medication to take. If your care team wants you to stop the medication, the dose may be slowly lowered over time to avoid any side effects. This medication may cause thoughts of suicide or depression. This includes sudden changes in mood, behaviors, or thoughts. These changes can happen at any time but are more common in the beginning of treatment or after a change in dose. Call your care team right away if you experience these thoughts or worsening depression. This medication may affect your coordination, reaction time, or judgment. Do not drive or operate machinery until you know how this medication affects you. Sit up or stand slowly to reduce the risk of dizzy or fainting spells. Drinking alcohol with this medication can increase the risk of these side effects. There have been reports of increased sexual urges or other strong urges, such as gambling while taking this medication. If you experience any of these while taking this medication, you should report this to your care team as soon as possible. This medication may cause dry eyes and blurred vision. If you wear contact lenses, you may feel some discomfort. Lubricating eye drops may help. See your care team if the problem does not go away or is severe. This medication may increase blood sugar. Ask your care team if changes in diet or medications are needed if you have diabetes. This medication can cause problems with controlling your body temperature. It can lower the response of your body to cold temperatures. If possible, stay indoors during cold weather. If you must go outdoors, wear warm clothes. It can also lower the response of your body to heat. Do not overheat. Do  not over-exercise. Stay out of the sun when possible. If you must be in the sun, wear cool clothing. Drink plenty of water. If you have trouble controlling your body temperature, call your care team right away. What side effects may I notice from receiving this medication? Side effects that you should report to your care team as soon as possible: Allergic reactions--skin rash, itching, hives, swelling of the face, lips, tongue, or throat High blood sugar (hyperglycemia)--increased thirst or amount of urine, unusual weakness or fatigue, blurry vision High fever, stiff muscles, increased sweating, fast or irregular heartbeat, and confusion, which may be signs of neuroleptic malignant syndrome Infection--fever, chills, cough, sore throat Low blood pressure--dizziness, feeling faint or lightheaded, blurry vision Pain or trouble swallowing Seizures Stroke--sudden numbness or weakness of the face, arm, or leg, trouble speaking, confusion, trouble walking, loss of balance or coordination, dizziness, severe headache, change in vision Thoughts of suicide or self-harm, worsening mood, feelings of depression Uncontrolled and repetitive body movements, muscle stiffness or spasms, tremors or shaking, loss of balance or coordination, restlessness, shuffling walk, which may be signs of extrapyramidal symptoms (EPS) Urges to engage in impulsive behaviors such as gambling, binge eating, sexual activity, or shopping in ways that are unusual for you Side effects that usually do not require medical attention (report to your care team if they continue or  are bothersome): Constipation Drowsiness Headache Weight gain This list may not describe all possible side effects. Call your doctor for medical advice about side effects. You may report side effects to FDA at 1-800-FDA-1088. Where should I keep my medication? Keep out of the reach of children and pets. Store at room temperature between 20 and 25 degrees C (68 and  77 degrees F). Get rid of any unused medication after the expiration date. To get rid of medications that are no longer needed or have expired: Take the medication to a medication take-back program. Check with your pharmacy or law enforcement to find a location. If you cannot return the medication, check the label or package insert to see if the medication should be thrown out in the garbage or flushed down the toilet. If you are not sure, ask your care team. If it is safe to put it in the trash, take the medication out of the container. Mix the medication with cat litter, dirt, coffee grounds, or other unwanted substance. Seal the mixture in a bag or container. Put it in the trash. NOTE: This sheet is a summary. It may not cover all possible information. If you have questions about this medicine, talk to your doctor, pharmacist, or health care provider.  2024 [employer]/Gold Standard (2023-04-30 00:00:00)

## 2024-01-29 NOTE — Progress Notes (Signed)
 Virtual Visit via Video Note  I connected with Sara Suarez on 01/29/24 at  8:30 AM EDT by a video enabled telemedicine application and verified that I am speaking with the correct person using two identifiers.  Location Provider Location : [facility] Patient Location : Home  Participants: Patient , Provider    I discussed the limitations of evaluation and management by telemedicine and the availability of in person appointments. The patient expressed understanding and agreed to proceed.   I discussed the assessment and treatment plan with the patient. The patient was provided an opportunity to ask questions and all were answered. The patient agreed with the plan and demonstrated an understanding of the instructions.   The patient was advised to call back or seek an in-person evaluation if the symptoms worsen or if the condition fails to improve as anticipated.   BH MD OP Progress Note  01/29/2024 5:38 PM Sara Suarez  MRN:  [id number]  Chief Complaint:  Chief Complaint  Patient presents with   Follow-up   Depression   Anxiety   Medication Refill   HPI: Sara Suarez is a 48 year old Caucasian female, lives in [place], has a history of GAD, insomnia, ADHD, psoriasis, atopic dermatitis, was evaluated by telemedicine today.  She has been experiencing symptoms of depression for a prolonged period, characterized by spending excessive time lying still and feeling overwhelmed by daily tasks. She is engaged in weekly therapy sessions with [facility], focusing on setting weekly goals, which she finds challenging. She believes her condition would be worse without medication.  Significant anxiety is present, with feelings of being overwhelmed by daily activities such as showering and work tasks. She is practicing mindfulness exercises, particularly breathing techniques, to manage these feelings.  Her sleep pattern is irregular, and she is attempting to improve it. She has a  CPAP machine for sleep apnea but does not use it consistently. Lack of sleep exacerbates her health issues.  She is currently taking venlafaxine, 37 mg plus 75 mg daily, without significant side effects. She has experienced heartburn and grogginess with other medications in the past. She expresses uncertainty about the medication's impact on her depression.  Her vitamin B12 and D levels were low in April 2024, and she is considering resuming supplementation. These deficiencies could contribute to fatigue and mood issues.  There is a family history of heart disease, with both grandfathers having died from it. She recently had a CT scan of her heart, which showed no calcium deposits. She also has high blood pressure and obesity, which influenced her decision to have the scan.  She denies any suicidality, homicidality or perceptual disturbances.    Visit Diagnosis:    ICD-10-CM   1. Generalized anxiety disorder  F41.1     2. Insomnia due to medical condition  G47.01    Mood, OSA    3. Current moderate episode of major depressive disorder without prior episode (HCC)  F32.1     4. Attention deficit disorder (ADD) in adult  F98.8     5. At risk for prolonged QT interval syndrome  Z91.89 EKG 12-Lead      Past Psychiatric History: I have reviewed past psychiatric history from progress note on 07/09/2019.  Past trials of Cymbalta, Klonopin, Ambien, BuSpar.  Past Medical History:  Past Medical History:  Diagnosis Date   ADHD (attention deficit hyperactivity disorder)    Allergy    Anxiety    Depression     Past Surgical History:  Procedure Laterality Date   BREAST CYST ASPIRATION Right    neg   ENDOSCOPIC PLANTAR FASCIOTOMY     FOOT SURGERY     MOUTH SURGERY     TUBAL LIGATION      Family Psychiatric History: I have reviewed family psychiatric history from progress note on 07/09/2019.  Family History:  Family History  Problem Relation Age of Onset   Breast cancer Mother 87    Anxiety disorder Mother    Breast cancer Maternal Aunt 95   Anxiety disorder Sister    Panic disorder Sister    Dementia Maternal Grandmother    Dementia Paternal Grandmother     Social History: I have reviewed social history from progress note on 07/09/2019. Social History   Socioeconomic History   Marital status: Married    Spouse name: jon   Number of children: 0   Years of education: Not on file   Highest education level: Bachelor's degree (e.g., BA, AB, BS)  Occupational History   Not on file  Tobacco Use   Smoking status: Never   Smokeless tobacco: Never  Vaping Use   Vaping status: Never Used  Substance and Sexual Activity   Alcohol use: Never   Drug use: Never   Sexual activity: Not on file  Other Topics Concern   Not on file  Social History Narrative   Not on file   Social Drivers of Health   Financial Resource Strain: Low Risk  (07/09/2019)   Overall Financial Resource Strain (CARDIA)    Difficulty of Paying Living Expenses: Not hard at all  Food Insecurity: No Food Insecurity (07/09/2019)   Hunger Vital Sign    Worried About Running Out of Food in the Last Year: Never true    Ran Out of Food in the Last Year: Never true  Transportation Needs: No Transportation Needs (07/09/2019)   PRAPARE - Administrator, Civil Service (Medical): No    Lack of Transportation (Non-Medical): No  Physical Activity: Inactive (07/09/2019)   Exercise Vital Sign    Days of Exercise per Week: 0 days    Minutes of Exercise per Session: 0 min  Stress: Stress Concern Present (07/09/2019)   [employer] of Occupational Health - Occupational Stress Questionnaire    Feeling of Stress : Rather much  Social Connections: Unknown (07/09/2019)   Social Connection and Isolation Panel [NHANES]    Frequency of Communication with Friends and Family: Not on file    Frequency of Social Gatherings with Friends and Family: Not on file    Attends Religious Services: Never    Active  Member of Clubs or Organizations: Yes    Attends Banker Meetings: More than 4 times per year    Marital Status: Married    Allergies:  Allergies  Allergen Reactions   Adbry [Tralokinumab-Ldrm] Rash   Atomoxetine Other (See Comments)    Heartburn   Guanfacine Other (See Comments)    drowsiness   Chlorpheniramine-Pseudoeph     Hyperactive   Dupilumab Other (See Comments)    Injection reaction but tolerable per patient   Pseudoephedrine Other (See Comments)    Hyperactive Other reaction(s): insomnia, shakey   Shellfish Allergy Other (See Comments)   Hydrocodone Itching   Prednisone Anxiety    insomnia    Metabolic Disorder Labs: No results found for: "HGBA1C", "MPG" No results found for: "PROLACTIN" Lab Results  Component Value Date   CHOL 173 02/06/2023   TRIG 94 02/06/2023   HDL  48 02/06/2023   CHOLHDL 3.6 02/06/2023   LDLCALC 108 (H) 02/06/2023   No results found for: "TSH"  Therapeutic Level Labs: No results found for: "LITHIUM" No results found for: "VALPROATE" No results found for: "CBMZ"  Current Medications: Current Outpatient Medications  Medication Sig Dispense Refill   albuterol (VENTOLIN HFA) 108 (90 Base) MCG/ACT inhaler Inhale 1-2 puffs into the lungs every 4 (four) hours as needed for wheezing or shortness of breath. 1 each 0   atovaquone-proguanil (MALARONE) 250-100 MG TABS tablet Take by mouth.     azelastine (ASTELIN) 0.1 % nasal spray azelastine 137 mcg (0.1 %) nasal spray aerosol     Cetirizine HCl 10 MG CAPS Take by mouth.     ciclopirox (LOPROX) 0.77 % cream Apply to the feet and between the toes BID x 4 weeks. 90 g 0   clobetasol (TEMOVATE) 0.05 % external solution Mix with CeraVe cream and use as directed. Avoid applying to face, groin, and axilla. 50 mL 2   clobetasol cream (TEMOVATE) 0.05 % Apply to affected areas rash on hands and foot twice daily for flares until improved. Avoid face, groin, underarms. 60 g 1   Crisaborole  (EUCRISA) 2 % OINT APPLY TO AFFECTED AREAS HANDS AND FEET 1-2 TIMES A DAY FOR MAINTENANCE. 60 g 2   cyclobenzaprine (FLEXERIL) 5 MG tablet Take by mouth.     Diclofenac Sodium (PENNSAID) 2 % SOLN      DUPIXENT 300 MG/2ML SOAJ INJECT 1 PEN SUBCUTANEOUSLY  EVERY OTHER WEEK 4 mL 5   EPINEPHrine 0.3 mg/0.3 mL IJ SOAJ injection See admin instructions.     gabapentin (NEURONTIN) 300 MG capsule Take 600 mg by mouth 3 (three) times daily. Takes differently     hydrOXYzine (VISTARIL) 50 MG capsule TAKE 1 CAPSULE (50 MG TOTAL) BY MOUTH 2 (TWO) TIMES DAILY AS NEEDED. 180 capsule 1   ibuprofen (ADVIL) 600 MG tablet Take 1 tablet (600 mg total) by mouth every 6 (six) hours as needed. 30 tablet 0   JORNAY PM 60 MG CP24 Take 1 capsule by mouth at bedtime.     losartan (COZAAR) 25 MG tablet Take 1 tablet by mouth daily.     mupirocin ointment (BACTROBAN) 2 % Apply 1 Application topically daily. 22 g 0   Na Sulfate-K Sulfate-Mg Sulf 17.5-3.13-1.6 GM/177ML SOLN Take 1 Bottle by mouth as directed. (Patient not taking: Reported on 08/22/2023)     Spacer/Aero-Holding Chambers (AEROCHAMBER MV) inhaler Use as instructed 1 each 1   spironolactone (ALDACTONE) 50 MG tablet TAKE 1-2 TABLETS BY MOUTH EVERY DAY FOR ACNE. 180 tablet 1   SUMAtriptan (IMITREX) 50 MG tablet Take by mouth.     tacrolimus (PROTOPIC) 0.1 % ointment Apply topically as directed. Qd to bid to aa hands, right foot until clear, then prn flares 100 g 2   triamcinolone (NASACORT) 55 MCG/ACT AERO nasal inhaler Nasal Allergy 55 mcg spray aerosol  2 SPRAYS ONCE A DAY NASALLY 30 DAYS     venlafaxine XR (EFFEXOR XR) 75 MG 24 hr capsule Take 1 capsule (75 mg total) by mouth daily with breakfast. 90 capsule 1   venlafaxine XR (EFFEXOR-XR) 37.5 MG 24 hr capsule Take 1 capsule (37.5 mg total) by mouth daily with breakfast. Take along with 75 mg daily 30 capsule 1   VITAMIN D PO Take by mouth.     zolpidem (AMBIEN) 5 MG tablet Take 1-1.5 tablets (5-7.5 mg total) by  mouth at bedtime as needed for  sleep. 45 tablet 1   No current facility-administered medications for this visit.     Musculoskeletal: Strength & Muscle Tone:  UTA Gait & Station:  Seated Patient leans: N/A  Psychiatric Specialty Exam: Review of Systems  Psychiatric/Behavioral:  Positive for decreased concentration, dysphoric mood and sleep disturbance. The patient is nervous/anxious.     There were no vitals taken for this visit.There is no height or weight on file to calculate BMI.  General Appearance: Fairly Groomed  Eye Contact:  Fair  Speech:  Clear and Coherent  Volume:  Normal  Mood:  Anxious and Depressed  Affect:  Congruent  Thought Process:  Goal Directed and Descriptions of Associations: Intact  Orientation:  Full (Time, Place, and Person)  Thought Content: Logical   Suicidal Thoughts:  No  Homicidal Thoughts:  No  Memory:  Immediate;   Fair Recent;   Fair Remote;   Fair  Judgement:  Fair  Insight:  Fair  Psychomotor Activity:  Normal  Concentration:  Concentration: Fair and Attention Span: Fair  Recall:  [employer] of Knowledge: Fair  Language: Fair  Akathisia:  No  Handed:  Right  AIMS (if indicated): not done  Assets:  Desire for Improvement Housing Social Support Transportation  ADL's:  Intact  Cognition: WNL  Sleep:   varies   Screenings: AIMS    Flowsheet Row Video Visit from 06/19/2022 in [facility]  AIMS Total Score 0      GAD-7    Flowsheet Row Office Visit from 07/25/2023 in [facility] Office Visit from 04/26/2023 in [facility] Video Visit from 06/19/2022 in [facility] Office Visit from 12/29/2021 in [facility]  Total GAD-7 Score 15 13 7 11       PHQ2-9    Flowsheet Row Office Visit from 07/25/2023 in [facility] Office Visit from 04/26/2023 in [facility] Video Visit from 08/15/2022 in [facility] Video Visit from 06/19/2022 in [facility] Office Visit from 12/29/2021 in [facility]  PHQ-2 Total Score 2 2 0 0 0  PHQ-9 Total Score 15 17 -- -- --      Flowsheet Row Video Visit from 01/29/2024 in [facility] Video Visit from 12/24/2023 in [facility] Video Visit from 10/17/2023 in [facility]  C-SSRS RISK CATEGORY No Risk No Risk No Risk        Assessment and Plan: Sara Suarez is a 48 year old Caucasian female, employed, lives in [place], married, has a history of MDD, GAD, insomnia was evaluated by telemedicine today.  Discussed assessment and plan as noted below.  Major Depressive Disorder-unstable Symptoms consistent with major depressive disorder include lack of motivation, low energy, and difficulty with daily activities. Venlafaxine is currently prescribed without significant side effects but may not fully address symptoms. Weekly therapy sessions with homework and mindfulness exercises have resumed. Consideration of adding an atypical antipsychotic, such as Abilify or Rexulti, is underway to enhance treatment efficacy. These medications require monitoring for side effects like weight gain, tardive dyskinesia, and metabolic changes. An EKG is necessary to assess cardiac health before initiation. - Continue Venlafaxine 112.5 mg daily - Consider adding an atypical antipsychotic such as Abilify or Rexulti after obtaining an EKG -  Provide information on Abilify and Rexulti - Encourage adherence to therapy and completion of homework assignments - Continue CBT with Ms.Huey Romans - Start vitamin B12  and vitamin D supplementation - Recheck vitamin levels during annual physical  Generalized Anxiety Disorder-unstable Significant anxiety exacerbated by daily tasks and work-related stress. Current venlafaxine dosage may be more effective for anxiety than depression. - Continue Venlafaxine as prescribed. - Encourage use of mindfulness exercises and breathing techniques  Insomnia/sleep Apnea-unstable Inconsistent use of CPAP machine may contribute to mood symptoms, concentration issues, and low energy. Improving sleep quality is a goal. - Encourage consistent use of CPAP machine - Include CPAP use in activity planning diary - Continue Ambien 7.5 mg at bedtime  ADHD-improving Currently lack of motivation likely due to her mood symptoms.  She is on [place] for ADHD symptoms which she is tolerating better than other medications. - Continue to follow up with ADHD specialist.  At risk for prolonged QT syndrome-we will order EKG.  Patient to call [phone].  Once EKG completed will consider starting medications like Abilify or Rexulti.  Follow-up Follow-up is required to monitor treatment effectiveness and adjust medications as necessary. An EKG is needed before starting an atypical antipsychotic. - Order EKG to assess cardiac health before starting atypical antipsychotic - Schedule follow-up appointment on May 1st at 1:20 PM via video - Call EKG facility to schedule appointment    Collaboration of Care: Collaboration of Care: Referral or follow-up with counselor/therapist AEB patient encouraged to continue CBT, patient encouraged to follow up with primary care provider.  Patient/Guardian was advised Release of Information must be obtained prior to any record release in order to collaborate their care with an outside provider. Patient/Guardian was advised if they have not already done so to contact the registration department to sign all necessary forms in order for [place] to release information  regarding their care.   Consent: Patient/Guardian gives verbal consent for treatment and assignment of benefits for services provided during this visit. Patient/Guardian expressed understanding and agreed to proceed.  Discussed the use of a AI scribe software for clinical note transcription with the patient, who gave verbal consent to proceed.  This note was generated in part or whole with voice recognition software. Voice recognition is usually quite accurate but there are transcription errors that can and very often do occur. I apologize for any typographical errors that were not detected and corrected.     Jomarie Longs, MD 01/29/2024, 5:38 PM

## 2024-02-04 ENCOUNTER — Ambulatory Visit
Admission: RE | Admit: 2024-02-04 | Discharge: 2024-02-04 | Disposition: A | Discharge status: Home / Self Care | Payer: Self-pay | Source: Ambulatory Visit | Attending: Psychiatry | Admitting: Psychiatry

## 2024-02-04 ENCOUNTER — Telehealth: Payer: Self-pay | Admitting: Psychiatry

## 2024-02-04 DIAGNOSIS — Z9189 Other specified personal risk factors, not elsewhere classified: Secondary | ICD-10-CM | POA: Insufficient documentation

## 2024-02-04 DIAGNOSIS — Z136 Encounter for screening for cardiovascular disorders: Secondary | ICD-10-CM | POA: Insufficient documentation

## 2024-02-04 DIAGNOSIS — F321 Major depressive disorder, single episode, moderate: Secondary | ICD-10-CM

## 2024-02-04 MED ORDER — BREXPIPRAZOLE 0.25 MG PO TABS: 0.2500 mg | ORAL_TABLET | Freq: Every day | ORAL | 1 refills | Status: DC

## 2024-02-04 NOTE — Telephone Encounter (Signed)
 Reviewed EKG dated 02/04/2024.  Will go ahead and start Rexulti 0.25 mg for depression.  Provided medication education.

## 2024-02-11 ENCOUNTER — Ambulatory Visit: Payer: 59 | Admitting: Dermatology

## 2024-02-11 ENCOUNTER — Encounter: Payer: Self-pay | Admitting: Dermatology

## 2024-02-11 DIAGNOSIS — Z1283 Encounter for screening for malignant neoplasm of skin: Secondary | ICD-10-CM | POA: Diagnosis not present

## 2024-02-11 DIAGNOSIS — R202 Paresthesia of skin: Secondary | ICD-10-CM

## 2024-02-11 DIAGNOSIS — L814 Other melanin hyperpigmentation: Secondary | ICD-10-CM | POA: Diagnosis not present

## 2024-02-11 DIAGNOSIS — D1801 Hemangioma of skin and subcutaneous tissue: Secondary | ICD-10-CM

## 2024-02-11 DIAGNOSIS — L821 Other seborrheic keratosis: Secondary | ICD-10-CM

## 2024-02-11 DIAGNOSIS — W908XXA Exposure to other nonionizing radiation, initial encounter: Secondary | ICD-10-CM

## 2024-02-11 DIAGNOSIS — L578 Other skin changes due to chronic exposure to nonionizing radiation: Secondary | ICD-10-CM

## 2024-02-11 DIAGNOSIS — D229 Melanocytic nevi, unspecified: Secondary | ICD-10-CM

## 2024-02-11 NOTE — Patient Instructions (Addendum)
 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 Chronic rubbing or scratching causes darkening of the skin.  OTC treatments which can help with itch include numbing creams like pramoxine or lidocaine which temporarily reduce itch or Capsaicin-containing creams which cause a burning sensation but which sometimes over time will reset the nerves to stop producing itch.  If you choose to use Capsaicin cream, it is recommended to use it 5 times daily for 1 week followed by 3 times daily for 3-6 weeks. You may have to continue using it long-term.      Recommend daily broad spectrum sunscreen SPF 30+ to sun-exposed areas, reapply every 2 hours as needed. Call for new or changing lesions.  Staying in the shade or wearing long sleeves, sun glasses (UVA+UVB protection) and wide brim hats (4-inch brim around the entire circumference of the hat) are also recommended for sun protection.      Melanoma ABCDEs  Melanoma is the most dangerous type of skin cancer, and is the leading cause of death from skin disease.  You are more likely to develop melanoma if you: Have light-colored skin, light-colored eyes, or red or blond hair Spend a lot of time in the sun Tan regularly, either outdoors or in a tanning bed Have had blistering sunburns, especially during childhood Have a close family member who has had a melanoma Have atypical moles or large birthmarks  Early detection of melanoma is key since treatment is typically straightforward and cure rates are extremely high if we catch it early.   The first sign of melanoma is often a change in a mole or a new dark spot.  The ABCDE system is a way of remembering the signs of melanoma.  A for asymmetry:  The two halves do not match. B for border:  The edges of the growth are irregular. C for color:  A mixture of colors are present instead of an even  brown color. D for diameter:  Melanomas are usually (but not always) greater than 6mm - the size of a pencil eraser. E for evolution:  The spot keeps changing in size, shape, and color.  Please check your skin once per month between visits. You can use a small mirror in front and a large mirror behind you to keep an eye on the back side or your body.   If you see any new or changing lesions before your next follow-up, please call to schedule a visit.  Please continue daily skin protection including broad spectrum sunscreen SPF 30+ to sun-exposed areas, reapplying every 2 hours as needed when you're outdoors.   Staying in the shade or wearing long sleeves, sun glasses (UVA+UVB protection) and wide brim hats (4-inch brim around the entire circumference of the hat) are also recommended for sun protection.      Due to recent changes in healthcare laws, you may see results of your pathology and/or laboratory studies on MyChart before the doctors have had a chance to review them. We understand that in some cases there may be results that are confusing or concerning to you. Please understand that not all results are received at the same time and often the doctors may need to interpret multiple results in order to provide you with the best plan of care or course of treatment. Therefore, we ask that you please give [place] 2 business days to thoroughly review all your results  before contacting the office for clarification. Should we see a critical lab result, you will be contacted sooner.   If You Need Anything After Your Visit  If you have any questions or concerns for your doctor, please call our main line at [phone] and press option 4 to reach your doctor's medical assistant. If no one answers, please leave a voicemail as directed and we will return your call as soon as possible. Messages left after 4 pm will be answered the following business day.   You may also send [place] a message via MyChart. We  typically respond to MyChart messages within 1-2 business days.  For prescription refills, please ask your pharmacy to contact our office. Our fax number is [phone].  If you have an urgent issue when the clinic is closed that cannot wait until the next business day, you can page your doctor at the number below.    Please note that while we do our best to be available for urgent issues outside of office hours, we are not available 24/7.   If you have an urgent issue and are unable to reach [place], you may choose to seek medical care at your doctor's office, retail clinic, urgent care center, or emergency room.  If you have a medical emergency, please immediately call 911 or go to the emergency department.  Pager Numbers  - Dr. Gwen Pounds: [phone]  - Dr. Roseanne Reno: [phone]  - Dr. Katrinka Blazing: [phone]   In the event of inclement weather, please call our main line at [phone] for an update on the status of any delays or closures.  Dermatology Medication Tips: Please keep the boxes that topical medications come in in order to help keep track of the instructions about where and how to use these. Pharmacies typically print the medication instructions only on the boxes and not directly on the medication tubes.   If your medication is too expensive, please contact our office at [phone] option 4 or send [place] a message through MyChart.   We are unable to tell what your co-pay for medications will be in advance as this is different depending on your insurance coverage. However, we may be able to find a substitute medication at lower cost or fill out paperwork to get insurance to cover a needed medication.   If a prior authorization is required to get your medication covered by your insurance company, please allow [place] 1-2 business days to complete this process.  Drug prices often vary depending on where the prescription is filled and some pharmacies may offer cheaper prices.  The  website www.goodrx.com contains coupons for medications through different pharmacies. The prices here do not account for what the cost may be with help from insurance (it may be cheaper with your insurance), but the website can give you the price if you did not use any insurance.  - You can print the associated coupon and take it with your prescription to the pharmacy.  - You may also stop by our office during regular business hours and pick up a GoodRx coupon card.  - If you need your prescription sent electronically to a different pharmacy, notify our office through [facility] or by phone at [phone] option 4.     Si Usted Necesita Algo Despus de Su Visita  Tambin puede enviarnos un mensaje a travs de Clinical cytogeneticist. Por lo general respondemos a los mensajes de MyChart en el transcurso de 1 a 2 das hbiles.  Para renovar recetas, por favor  pida a su farmacia que se ponga en contacto con nuestra oficina. Annie Sable de fax es [place] [phone].  Si tiene un asunto urgente cuando la clnica est cerrada y que no puede esperar hasta el siguiente da hbil, puede llamar/localizar a su doctor(a) al nmero que aparece a continuacin.   Por favor, tenga en cuenta que aunque hacemos todo lo posible para estar disponibles para asuntos urgentes fuera del horario de [place], no estamos disponibles las 24 horas del da, los 7 [address]  [address] de la [place].   Si tiene un problema urgente y no puede comunicarse con nosotros, puede optar por buscar atencin mdica  en el consultorio de su doctor(a), en una clnica privada, en un centro de atencin urgente o en una sala de emergencias.  Si tiene Engineer, drilling, por favor llame inmediatamente al 911 o vaya a la sala de emergencias.  Nmeros de bper  - Dr. Gwen Pounds: [phone]  - Dra. Roseanne Reno: [phone]  - Dr. Katrinka Blazing: [phone]   En caso de inclemencias del tiempo, por favor llame a Lacy Duverney principal al [phone] para una  actualizacin sobre el [place] de cualquier retraso o cierre.  Consejos para la medicacin en dermatologa: Por favor, guarde las cajas en las que vienen los medicamentos de uso tpico para ayudarle a seguir las instrucciones sobre dnde y cmo usarlos. Las farmacias generalmente imprimen las instrucciones del medicamento slo en las cajas y no directamente en los tubos del [place].   Si su medicamento es muy caro, por favor, pngase en contacto con Rolm Gala llamando al [phone] y presione la opcin 4 o envenos un mensaje a travs de Clinical cytogeneticist.   No podemos decirle cul ser su copago por los medicamentos por adelantado ya que esto es diferente dependiendo de la cobertura de su seguro. Sin embargo, es posible que podamos encontrar un medicamento sustituto a Audiological scientist un formulario para que el seguro cubra el medicamento que se considera necesario.   Si se requiere una autorizacin previa para que su compaa de seguros [place] su medicamento, por favor permtanos de 1 a 2 das hbiles para completar [address]  Los precios de los medicamentos varan con frecuencia dependiendo del Environmental consultant de dnde se surte la receta y alguna farmacias pueden ofrecer precios ms baratos.  El sitio web www.goodrx.com tiene cupones para medicamentos de Health and safety inspector. Los precios aqu no tienen en cuenta lo que podra costar con la ayuda del seguro (puede ser ms barato con su seguro), pero el sitio web puede darle el precio si no utiliz Tourist information centre manager.  - Puede imprimir el cupn correspondiente y llevarlo con su receta a la farmacia.  - Tambin puede pasar por nuestra oficina durante el horario de atencin regular y Education officer, museum una tarjeta de cupones de GoodRx.  - Si necesita que su receta se enve electrnicamente a una farmacia diferente, informe a nuestra oficina a travs de MyChart de [place] o por telfono llamando al [phone] y presione la opcin 4.

## 2024-02-11 NOTE — Progress Notes (Signed)
   Follow-Up Visit   Subjective  Sara Suarez is a 48 y.o. female who presents for the following: Skin Cancer Screening and Full Body Skin Exam. No personal hx of skin cancer or dysplastic nevi. Father has had things removed. Unsure if skin cancer or not.   The patient presents for Total-Body Skin Exam (TBSE) for skin cancer screening and mole check. The patient has spots, moles and lesions to be evaluated, some may be new or changing and the patient may have concern these could be cancer.    The following portions of the chart were reviewed this encounter and updated as appropriate: medications, allergies, medical history  Review of Systems:  No other skin or systemic complaints except as noted in HPI or Assessment and Plan.  Objective  Well appearing patient in no apparent distress; mood and affect are within normal limits.  A full examination was performed including scalp, head, eyes, ears, nose, lips, neck, chest, axillae, abdomen, back, buttocks, bilateral upper extremities, bilateral lower extremities, hands, feet, fingers, toes, fingernails, and toenails. All findings within normal limits unless otherwise noted below.   Relevant physical exam findings are noted in the Assessment and Plan.    Assessment & Plan   SKIN CANCER SCREENING PERFORMED TODAY.  ACTINIC DAMAGE - Chronic condition, secondary to cumulative UV/sun exposure - diffuse scaly erythematous macules with underlying dyspigmentation - Recommend daily broad spectrum sunscreen SPF 30+ to sun-exposed areas, reapply every 2 hours as needed.  - Staying in the shade or wearing long sleeves, sun glasses (UVA+UVB protection) and wide brim hats (4-inch brim around the entire circumference of the hat) are also recommended for sun protection.  - Call for new or changing lesions.  LENTIGINES, SEBORRHEIC KERATOSES, HEMANGIOMAS - Benign normal skin lesions - Benign-appearing - Call for any changes  MELANOCYTIC NEVI -  Tan-brown and/or pink-flesh-colored symmetric macules and papules - Benign appearing on exam today - Observation - Call clinic for new or changing moles - Recommend daily use of broad spectrum spf 30+ sunscreen to sun-exposed areas.    NOTALGIA PARESTHETICA Exam: Perispinal hyperpigmented patch Chronic condition without cure secondary to pinched nerve along spine causing itching or sensation changes in an area of skin. Chronic rubbing or scratching causes darkening of the skin.  OTC treatments which can help with itch include numbing creams like pramoxine or lidocaine which temporarily reduce itch or Capsaicin-containing creams which cause a burning sensation but which sometimes over time will reset the nerves to stop producing itch.  If you choose to use Capsaicin cream, it is recommended to use it 5 times daily for 1 week followed by 3 times daily for 3-6 weeks. You may have to continue using it long-term.  If not doing well with OTC options, could consider [facility] compounded prescription anti-itch cream with Amitriptyline 5% / Lidocaine 5% / Pramoxine 1% or Amitriptyline 5% / Gabapentin 10% / Lidocaine 5% Cream or other prescription cream or pill options.     MULTIPLE BENIGN NEVI   LENTIGINES   ACTINIC ELASTOSIS   SEBORRHEIC KERATOSES   CHERRY ANGIOMA   NOTALGIA PARESTHETICA   Return for TBSE in 2 years, Atopic Dermatitis Follow Up as needed with Dr. Roseanne Reno.  I, Lawson Radar, CMA, am acting as scribe for Elie Goody, MD.   Documentation: I have reviewed the above documentation for accuracy and completeness, and I agree with the above.  Elie Goody, MD

## 2024-02-19 DIAGNOSIS — F411 Generalized anxiety disorder: Secondary | ICD-10-CM

## 2024-02-20 MED ORDER — VENLAFAXINE HCL ER 75 MG PO CP24
75.0000 mg | ORAL_CAPSULE | Freq: Every day | ORAL | 0 refills | Status: DC
Start: 2024-02-20 — End: 2024-05-18

## 2024-02-20 MED ORDER — VENLAFAXINE HCL ER 37.5 MG PO CP24: 37.5000 mg | ORAL_CAPSULE | Freq: Every day | ORAL | 0 refills | Status: DC

## 2024-02-20 NOTE — Telephone Encounter (Signed)
 I have sent both prescription for venlafaxine for 90 days to pharmacy.  I spoke to patient by phone.

## 2024-02-25 ENCOUNTER — Other Ambulatory Visit: Payer: Self-pay | Admitting: Gerontology

## 2024-02-25 DIAGNOSIS — Z1231 Encounter for screening mammogram for malignant neoplasm of breast: Secondary | ICD-10-CM

## 2024-03-13 ENCOUNTER — Telehealth (INDEPENDENT_AMBULATORY_CARE_PROVIDER_SITE_OTHER): Payer: Self-pay | Admitting: Psychiatry

## 2024-03-13 ENCOUNTER — Encounter: Payer: Self-pay | Admitting: Psychiatry

## 2024-03-13 DIAGNOSIS — F321 Major depressive disorder, single episode, moderate: Secondary | ICD-10-CM | POA: Diagnosis not present

## 2024-03-13 DIAGNOSIS — F988 Other specified behavioral and emotional disorders with onset usually occurring in childhood and adolescence: Secondary | ICD-10-CM

## 2024-03-13 DIAGNOSIS — G4701 Insomnia due to medical condition: Secondary | ICD-10-CM

## 2024-03-13 DIAGNOSIS — F411 Generalized anxiety disorder: Secondary | ICD-10-CM

## 2024-03-13 NOTE — Progress Notes (Signed)
 Virtual Visit via Video Note  I connected with Sara Suarez on 03/13/24 at  1:20 PM EDT by a video enabled telemedicine application and verified that I am speaking with the correct person using two identifiers.  Location Provider Location : [facility] Patient Location : Home  Participants: Patient , Provider    I discussed the limitations of evaluation and management by telemedicine and the availability of in person appointments. The patient expressed understanding and agreed to proceed.   I discussed the assessment and treatment plan with the patient. The patient was provided an opportunity to ask questions and all were answered. The patient agreed with the plan and demonstrated an understanding of the instructions.   The patient was advised to call back or seek an in-person evaluation if the symptoms worsen or if the condition fails to improve as anticipated.   BH MD OP Progress Note  03/14/2024 3:23 PM Sara Suarez  MRN:  [id number]  Chief Complaint:  Chief Complaint  Patient presents with   Follow-up   Anxiety   Depression   Medication Refill   Discussed the use of AI scribe software for clinical note transcription with the patient, who gave verbal consent to proceed.  History of Present Illness Sara Suarez is a 48 year old Caucasian female, lives in [place], has a history of GAD, insomnia, ADHD, psoriasis, atopic dermatitis was evaluated by telemedicine today.    She has a history of major depression and insomnia. She started taking Rexulti  and notes improvement, as she no longer feels like 'hiding under a blanket all the time.' She is in regular contact with her therapist, Sonya, with sessions now spaced out to every two to three weeks. No side effects from Rexulti  and no muscle spasms.  Denies thoughts of self-harm or harm to others and her anxiety is manageable on her current medication regimen. She is currently also taking venlafaxine  in addition to  Rexulti .  Regarding her sleep, she is sleeping more in the last month, which she attributes to feeling less anxious. She is not experiencing daytime sleepiness and is not consuming coffee.  She has a history of ADHD, but there is no specific discussion of current symptoms or treatment adjustments in this conversation.  She is currently under the care of [facility] specialist at this time and is on Jornay 60 mg daily.  She has a history of knee issues, having twisted her knee about a year ago. She recently visited an orthopedic doctor.    Visit Diagnosis:    ICD-10-CM   1. Generalized anxiety disorder  F41.1     2. Insomnia due to medical condition  G47.01    Mood, OSA    3. Current moderate episode of major depressive disorder without prior episode (HCC)  F32.1     4. Attention deficit disorder (ADD) in adult  F98.8       Past Psychiatric History: I have reviewed past psychiatric history from progress note on 07/09/2019.  Past trials of Cymbalta , Klonopin, Ambien , BuSpar   Past Medical History:  Past Medical History:  Diagnosis Date   ADHD (attention deficit hyperactivity disorder)    Allergy    Anxiety    Depression     Past Surgical History:  Procedure Laterality Date   BREAST CYST ASPIRATION Right    neg   ENDOSCOPIC PLANTAR FASCIOTOMY     FOOT SURGERY     MOUTH SURGERY     TUBAL LIGATION      Family  Psychiatric History: I have reviewed family psychiatric history from progress note on 07/09/2019.  Family History:  Family History  Problem Relation Age of Onset   Breast cancer Mother 55   Anxiety disorder Mother    Breast cancer Maternal Aunt 36   Anxiety disorder Sister    Panic disorder Sister    Dementia Maternal Grandmother    Dementia Paternal Grandmother     Social History: I have reviewed social history from progress note on 07/09/2019. Social History   Socioeconomic History   Marital status: Married    Spouse name: jon   Number of children:  0   Years of education: Not on file   Highest education level: Bachelor's degree (e.g., BA, AB, BS)  Occupational History   Not on file  Tobacco Use   Smoking status: Never   Smokeless tobacco: Never  Vaping Use   Vaping status: Never Used  Substance and Sexual Activity   Alcohol use: Never   Drug use: Never   Sexual activity: Not on file  Other Topics Concern   Not on file  Social History Narrative   Not on file   Social Drivers of Health   Financial Resource Strain: Low Risk  (03/04/2024)   Received from [facility]   Overall Financial Resource Strain (CARDIA)    Difficulty of Paying Living Expenses: Not hard at all  Food Insecurity: No Food Insecurity (03/04/2024)   Received from [facility]   Hunger Vital Sign    Worried About Running Out of Food in the Last Year: Never true    Ran Out of Food in the Last Year: Never true  Transportation Needs: No Transportation Needs (03/04/2024)   Received from [facility] - Transportation    In the past 12 months, has lack of transportation kept you from medical appointments or from getting medications?: No    Lack of Transportation (Non-Medical): No  Physical Activity: Inactive (07/09/2019)   Exercise Vital Sign    Days of Exercise per Week: 0 days    Minutes of Exercise per Session: 0 min  Stress: Stress Concern Present (07/09/2019)   [employer] of Occupational Health - Occupational Stress Questionnaire    Feeling of Stress : Rather much  Social Connections: Unknown (07/09/2019)   Social Connection and Isolation Panel [NHANES]    Frequency of Communication with Friends and Family: Not on file    Frequency of Social Gatherings with Friends and Family: Not on file    Attends Religious Services: Never    Active Member of Clubs or Organizations: Yes    Attends Banker Meetings: More than 4 times per year    Marital Status: Married    Allergies:   Allergies  Allergen Reactions   Adbry  [Tralokinumab -Ldrm] Rash   Atomoxetine Other (See Comments)    Heartburn   Guanfacine Other (See Comments)    drowsiness   Chlorpheniramine-Pseudoeph     Hyperactive   Dupilumab  Other (See Comments)    Injection reaction but tolerable per patient   Pseudoephedrine Other (See Comments)    Hyperactive Other reaction(s): insomnia, shakey   Shellfish Allergy Other (See Comments)   Hydrocodone Itching   Prednisone  Anxiety    insomnia    Metabolic Disorder Labs: No results found for: "HGBA1C", "MPG" No results found for: "PROLACTIN" Lab Results  Component Value Date   CHOL 173 02/06/2023   TRIG 94 02/06/2023   HDL 48 02/06/2023  CHOLHDL 3.6 02/06/2023   LDLCALC 108 (H) 02/06/2023   No results found for: "TSH"  Therapeutic Level Labs: No results found for: "LITHIUM" No results found for: "VALPROATE" No results found for: "CBMZ"  Current Medications: Current Outpatient Medications  Medication Sig Dispense Refill   Cholecalciferol 125 MCG (5000 UT) capsule Take 5,000 Units by mouth.     etodolac (LODINE) 400 MG tablet Take 400 mg by mouth.     albuterol  (VENTOLIN  HFA) 108 (90 Base) MCG/ACT inhaler Inhale 1-2 puffs into the lungs every 4 (four) hours as needed for wheezing or shortness of breath. 1 each 0   atovaquone-proguanil (MALARONE) 250-100 MG TABS tablet Take by mouth.     azelastine (ASTELIN) 0.1 % nasal spray azelastine 137 mcg (0.1 %) nasal spray aerosol     brexpiprazole  (REXULTI ) 0.25 MG TABS tablet Take 1 tablet (0.25 mg total) by mouth daily. 30 tablet 1   Cetirizine HCl 10 MG CAPS Take by mouth.     ciclopirox  (LOPROX ) 0.77 % cream Apply to the feet and between the toes BID x 4 weeks. 90 g 0   clobetasol  (TEMOVATE ) 0.05 % external solution Mix with CeraVe cream and use as directed. Avoid applying to face, groin, and axilla. 50 mL 2   clobetasol  cream (TEMOVATE ) 0.05 % Apply to affected areas rash on hands and foot twice  daily for flares until improved. Avoid face, groin, underarms. 60 g 1   Crisaborole  (EUCRISA ) 2 % OINT APPLY TO AFFECTED AREAS HANDS AND FEET 1-2 TIMES A DAY FOR MAINTENANCE. 60 g 2   cyclobenzaprine (FLEXERIL) 5 MG tablet Take by mouth.     Diclofenac  Sodium (PENNSAID ) 2 % SOLN      DUPIXENT  300 MG/2ML SOAJ INJECT 1 PEN SUBCUTANEOUSLY  EVERY OTHER WEEK 4 mL 5   EPINEPHrine 0.3 mg/0.3 mL IJ SOAJ injection See admin instructions.     gabapentin (NEURONTIN) 300 MG capsule Take 600 mg by mouth 3 (three) times daily. Takes differently     hydrOXYzine  (VISTARIL ) 50 MG capsule TAKE 1 CAPSULE (50 MG TOTAL) BY MOUTH 2 (TWO) TIMES DAILY AS NEEDED. 180 capsule 1   ibuprofen  (ADVIL ) 600 MG tablet Take 1 tablet (600 mg total) by mouth every 6 (six) hours as needed. 30 tablet 0   JORNAY PM 60 MG CP24 Take 1 capsule by mouth at bedtime.     losartan (COZAAR) 25 MG tablet Take 1 tablet by mouth daily.     mupirocin  ointment (BACTROBAN ) 2 % Apply 1 Application topically daily. 22 g 0   Na Sulfate-K Sulfate-Mg Sulf 17.5-3.13-1.6 GM/177ML SOLN Take 1 Bottle by mouth as directed. (Patient not taking: Reported on 08/22/2023)     Spacer/Aero-Holding Chambers (AEROCHAMBER MV) inhaler Use as instructed 1 each 1   spironolactone  (ALDACTONE ) 50 MG tablet TAKE 1-2 TABLETS BY MOUTH EVERY DAY FOR ACNE. 180 tablet 1   SUMAtriptan (IMITREX) 50 MG tablet Take by mouth.     tacrolimus  (PROTOPIC ) 0.1 % ointment Apply topically as directed. Qd to bid to aa hands, right foot until clear, then prn flares 100 g 2   triamcinolone  (NASACORT ) 55 MCG/ACT AERO nasal inhaler Nasal Allergy 55 mcg spray aerosol  2 SPRAYS ONCE A DAY NASALLY 30 DAYS     venlafaxine  XR (EFFEXOR  XR) 75 MG 24 hr capsule Take 1 capsule (75 mg total) by mouth daily with breakfast. Take along with 37.5 mg daily 90 capsule 0   venlafaxine  XR (EFFEXOR -XR) 37.5 MG 24 hr capsule  Take 1 capsule (37.5 mg total) by mouth daily with breakfast. Take along with 75 mg daily 90  capsule 0   VITAMIN D PO Take by mouth.     zolpidem  (AMBIEN ) 5 MG tablet Take 1-1.5 tablets (5-7.5 mg total) by mouth at bedtime as needed for sleep. 45 tablet 1   No current facility-administered medications for this visit.     Musculoskeletal: Strength & Muscle Tone:  UTA Gait & Station:  Seated Patient leans: N/A  Psychiatric Specialty Exam: Review of Systems  Psychiatric/Behavioral:  Positive for dysphoric mood. The patient is nervous/anxious.     There were no vitals taken for this visit.There is no height or weight on file to calculate BMI.  General Appearance: Fairly Groomed  Eye Contact:  Fair  Speech:  Clear and Coherent  Volume:  Normal  Mood:  Anxious and Depressed improving  Affect:  Appropriate  Thought Process:  Goal Directed and Descriptions of Associations: Intact  Orientation:  Full (Time, Place, and Person)  Thought Content: Logical   Suicidal Thoughts:  No  Homicidal Thoughts:  No  Memory:  Immediate;   Fair Recent;   Fair Remote;   Fair  Judgement:  Fair  Insight:  Fair  Psychomotor Activity:  Normal  Concentration:  Concentration: Fair and Attention Span: Fair  Recall:  [employer] of Knowledge: Fair  Language: Fair  Akathisia:  No  Handed:  Right  AIMS (if indicated):   Assets:  Desire for Improvement Housing Social Support Talents/Skills Transportation  ADL's:  Intact  Cognition: WNL  Sleep:   Fair   Screenings: AIMS    Flowsheet Row Video Visit from 06/19/2022 in [facility]  AIMS Total Score 0      GAD-7    Flowsheet Row Office Visit from 07/25/2023 in [facility] Office Visit from 04/26/2023 in [facility] Video Visit from 06/19/2022 in [facility] Office Visit from 12/29/2021 in [facility]  Total GAD-7 Score 15 13 7 11        PHQ2-9    Flowsheet Row Office Visit from 07/25/2023 in [facility] Office Visit from 04/26/2023 in [facility] Video Visit from 08/15/2022 in [facility] Video Visit from 06/19/2022 in [facility] Office Visit from 12/29/2021 in [facility]  PHQ-2 Total Score 2 2 0 0 0  PHQ-9 Total Score 15 17 -- -- --      Flowsheet Row Video Visit from 03/13/2024 in [facility] Video Visit from 01/29/2024 in [facility] Video Visit from 12/24/2023 in [facility]  C-SSRS RISK CATEGORY No Risk No Risk No Risk        Assessment and Plan:Sara Suarez is a 48 year old Caucasian female, employed, lives in [place], married, has a history of MDD, GAD, insomnia was evaluated by telemedicine today.  Discussed assessment and plan as noted below.  Assessment & Plan Major depression-improving Major depression is improving with the current treatment regimen. She reports a significant improvement in her mental state, no longer feeling the urge to hide under a blanket. She is attending therapy sessions with her therapist, Sonya, every two to three weeks as needed. No side effects from Rexulti . Anxiety is manageable with the current regimen. -  Continue Rexulti  0.25 mg as prescribed. - Continue Venlafaxine  112.5 mg daily - Continue regular therapy sessions with therapist Ms. Leoma Raja.  Insomnia/Sleep apnea-improving Insomnia is improving, with increased sleep in the last month. This may be due to reduced anxiety or a direct effect of the medication. She does not experience daytime sleepiness and is not consuming coffee, indicating improved sleep quality. - Continue Ambien  7.5 mg at bedtime -  Encourage consistent use of CPAP for obstructive sleep apnea  Generalized anxiety disorder-improving Anxiety symptoms improving on the addition of Rexulti .  Currently compliant on venlafaxine  and psychotherapy. - Continue Venlafaxine  112.5 mg daily - Continue CBT with Ms. Leoma Raja  ADHD -currently under the care of [facility] specialist.  Follow-up Follow-up in clinic in 8 to 10 weeks or sooner if needed.   Collaboration of Care: Collaboration of Care: Referral or follow-up with counselor/therapist AEB encouraged to continue CBT  Patient/Guardian was advised Release of Information must be obtained prior to any record release in order to collaborate their care with an outside provider. Patient/Guardian was advised if they have not already done so to contact the registration department to sign all necessary forms in order for us  to release information regarding their care.   Consent: Patient/Guardian gives verbal consent for treatment and assignment of benefits for services provided during this visit. Patient/Guardian expressed understanding and agreed to proceed.   This note was generated in part or whole with voice recognition software. Voice recognition is usually quite accurate but there are transcription errors that can and very often do occur. I apologize for any typographical errors that were not detected and corrected.    Cono Gebhard, MD 03/14/2024, 3:23 PM

## 2024-03-24 ENCOUNTER — Ambulatory Visit
Admission: RE | Admit: 2024-03-24 | Discharge: 2024-03-24 | Disposition: A | Discharge status: Home / Self Care | Source: Ambulatory Visit | Attending: Gerontology | Admitting: Gerontology

## 2024-03-24 DIAGNOSIS — Z1231 Encounter for screening mammogram for malignant neoplasm of breast: Secondary | ICD-10-CM | POA: Diagnosis present

## 2024-04-09 ENCOUNTER — Other Ambulatory Visit: Payer: Self-pay | Admitting: Psychiatry

## 2024-04-09 DIAGNOSIS — F321 Major depressive disorder, single episode, moderate: Secondary | ICD-10-CM

## 2024-04-15 ENCOUNTER — Other Ambulatory Visit: Payer: Self-pay | Admitting: Dermatology

## 2024-05-17 ENCOUNTER — Other Ambulatory Visit: Payer: Self-pay | Admitting: Psychiatry

## 2024-05-17 DIAGNOSIS — F411 Generalized anxiety disorder: Secondary | ICD-10-CM

## 2024-05-27 ENCOUNTER — Encounter: Payer: Self-pay | Admitting: Psychiatry

## 2024-05-27 ENCOUNTER — Telehealth (INDEPENDENT_AMBULATORY_CARE_PROVIDER_SITE_OTHER): Payer: Self-pay | Admitting: Psychiatry

## 2024-05-27 DIAGNOSIS — G4701 Insomnia due to medical condition: Secondary | ICD-10-CM

## 2024-05-27 DIAGNOSIS — F321 Major depressive disorder, single episode, moderate: Secondary | ICD-10-CM

## 2024-05-27 DIAGNOSIS — F988 Other specified behavioral and emotional disorders with onset usually occurring in childhood and adolescence: Secondary | ICD-10-CM | POA: Diagnosis not present

## 2024-05-27 DIAGNOSIS — F411 Generalized anxiety disorder: Secondary | ICD-10-CM

## 2024-05-27 MED ORDER — BREXPIPRAZOLE 0.25 MG PO TABS: 0.2500 mg | ORAL_TABLET | Freq: Every day | ORAL | Status: DC

## 2024-05-27 NOTE — Progress Notes (Signed)
 Virtual Visit via Video Note  I connected with Sara Suarez on 05/27/24 at 10:30 AM EDT by a video enabled telemedicine application and verified that I am speaking with the correct person using two identifiers.  Location Provider Location : [facility] Patient Location : Home  Participants: Patient , Provider    I discussed the limitations of evaluation and management by telemedicine and the availability of in person appointments. The patient expressed understanding and agreed to proceed.   I discussed the assessment and treatment plan with the patient. The patient was provided an opportunity to ask questions and all were answered. The patient agreed with the plan and demonstrated an understanding of the instructions.   The patient was advised to call back or seek an in-person evaluation if the symptoms worsen or if the condition fails to improve as anticipated.   BH MD OP Progress Note  05/27/2024 10:59 AM Altovise Wahler  MRN:  [id number]  Chief Complaint:  Chief Complaint  Patient presents with   Follow-up   Anxiety   Depression   Medication Refill    Discussed the use of AI scribe software for clinical note transcription with the patient, who gave verbal consent to proceed.  History of Present Illness Sara Suarez is a 48 year old Caucasian female, lives in [place], has a history of GAD, MDD, insomnia, ADHD, psoriasis, atopic dermatitis was evaluated by telemedicine today.  She has stable symptoms of depression and anxiety while taking Rexulti  0.25 mg daily, although she occasionally misses doses if she does not fill her pill containers. She is functioning well at work and in her personal life, engaging in outdoor activities and feeling motivated. However, she experiences tiredness and soreness, particularly in her hips and lower back.  She is scheduled for spinal injections on Thursday. Over the past two weeks, she has maintained interest in activities but feels  tired and sore, which makes it challenging to accomplish everything. She is preparing for a three-week work trip to [place] and is concerned about managing her back and hip pain during the trip. She reports sleeping more than usual due to fatigue from pain and work demands, which she views positively as she typically does not get much sleep.  No feelings of being down, depressed, or hopeless are reported except in relation to her back and hip pain. Her appetite remains unchanged, and her concentration is good on her current medication regimen, which includes [place]. She is adjusting her Jornay dose from 60 mg to 40 mg to assess its effectiveness. No feelings of being bad about herself, moving slowly, or experiencing restlessness are reported. She denies any suicidal thoughts.  She is interested in coming off of the Rexulti  due to concerns about long-term side effects.     Visit Diagnosis:    ICD-10-CM   1. Generalized anxiety disorder  F41.1     2. Insomnia due to medical condition  G47.01    Mood symptoms, OSA    3. Current moderate episode of major depressive disorder without prior episode (HCC)  F32.1 brexpiprazole  (REXULTI ) 0.25 MG TABS tablet    4. Attention deficit disorder (ADD) in adult  F98.8       Past Psychiatric History: I have reviewed past psychiatric history from progress note on 07/09/2019.  Past trials of Cymbalta , Klonopin, Ambien , BuSpar .  Past Medical History:  Past Medical History:  Diagnosis Date   ADHD (attention deficit hyperactivity disorder)    Allergy    Anxiety  Depression     Past Surgical History:  Procedure Laterality Date   BREAST CYST ASPIRATION Right    neg   ENDOSCOPIC PLANTAR FASCIOTOMY     FOOT SURGERY     MOUTH SURGERY     TUBAL LIGATION      Family Psychiatric History: I have reviewed family psychiatric history from progress note on 07/09/2019.  Family History:  Family History  Problem Relation Age of Onset   Breast cancer Mother  108   Anxiety disorder Mother    Breast cancer Maternal Aunt 51   Anxiety disorder Sister    Panic disorder Sister    Dementia Maternal Grandmother    Dementia Paternal Grandmother     Social History: I have reviewed social history from progress note on 07/09/2019. Social History   Socioeconomic History   Marital status: Married    Spouse name: jon   Number of children: 0   Years of education: Not on file   Highest education level: Bachelor's degree (e.g., BA, AB, BS)  Occupational History   Not on file  Tobacco Use   Smoking status: Never   Smokeless tobacco: Never  Vaping Use   Vaping status: Never Used  Substance and Sexual Activity   Alcohol use: Never   Drug use: Never   Sexual activity: Not on file  Other Topics Concern   Not on file  Social History Narrative   Not on file   Social Drivers of Health   Financial Resource Strain: Low Risk  (04/02/2024)   Received from [facility]   Overall Financial Resource Strain (CARDIA)    Difficulty of Paying Living Expenses: Not hard at all  Food Insecurity: No Food Insecurity (04/02/2024)   Received from [facility]   Hunger Vital Sign    Within the past 12 months, you worried that your food would run out before you got the money to buy more.: Never true    Within the past 12 months, the food you bought just didn't last and you didn't have money to get more.: Never true  Transportation Needs: No Transportation Needs (04/02/2024)   Received from [facility] - Transportation    In the past 12 months, has lack of transportation kept you from medical appointments or from getting medications?: No    Lack of Transportation (Non-Medical): No  Physical Activity: Inactive (07/09/2019)   Exercise Vital Sign    Days of Exercise per Week: 0 days    Minutes of Exercise per Session: 0 min  Stress: Stress Concern Present (07/09/2019)   [employer] of Occupational [employer]  - Occupational Stress Questionnaire    Feeling of Stress : Rather much  Social Connections: Unknown (07/09/2019)   Social Connection and Isolation Panel    Frequency of Communication with Friends and Family: Not on file    Frequency of Social Gatherings with Friends and Family: Not on file    Attends Religious Services: Never    Active Member of Clubs or Organizations: Yes    Attends Banker Meetings: More than 4 times per year    Marital Status: Married    Allergies:  Allergies  Allergen Reactions   Adbry  [Tralokinumab -Ldrm] Rash   Atomoxetine Other (See Comments)    Heartburn   Guanfacine Other (See Comments)    drowsiness   Chlorpheniramine-Pseudoeph     Hyperactive   Dupilumab  Other (See Comments)    Injection reaction but tolerable per patient  Pseudoephedrine Other (See Comments)    Hyperactive Other reaction(s): insomnia, shakey   Shellfish Allergy Other (See Comments)   Hydrocodone Itching   Prednisone  Anxiety    insomnia    Metabolic Disorder Labs: No results found for: HGBA1C, MPG No results found for: PROLACTIN Lab Results  Component Value Date   CHOL 173 02/06/2023   TRIG 94 02/06/2023   HDL 48 02/06/2023   CHOLHDL 3.6 02/06/2023   LDLCALC 108 (H) 02/06/2023   No results found for: TSH  Therapeutic Level Labs: No results found for: LITHIUM No results found for: VALPROATE No results found for: CBMZ  Current Medications: Current Outpatient Medications  Medication Sig Dispense Refill   albuterol  (VENTOLIN  HFA) 108 (90 Base) MCG/ACT inhaler Inhale 1-2 puffs into the lungs every 4 (four) hours as needed for wheezing or shortness of breath. 1 each 0   atovaquone-proguanil (MALARONE) 250-100 MG TABS tablet Take by mouth.     azelastine (ASTELIN) 0.1 % nasal spray azelastine 137 mcg (0.1 %) nasal spray aerosol     brexpiprazole  (REXULTI ) 0.25 MG TABS tablet Take 1 tablet (0.25 mg total) by mouth daily. Take half tablet for a week  and stop     Cetirizine HCl 10 MG CAPS Take by mouth.     Cholecalciferol 125 MCG (5000 UT) capsule Take 5,000 Units by mouth.     ciclopirox  (LOPROX ) 0.77 % cream Apply to the feet and between the toes BID x 4 weeks. 90 g 0   clobetasol  (TEMOVATE ) 0.05 % external solution Mix with CeraVe cream and use as directed. Avoid applying to face, groin, and axilla. 50 mL 2   clobetasol  cream (TEMOVATE ) 0.05 % Apply to affected areas rash on hands and foot twice daily for flares until improved. Avoid face, groin, underarms. 60 g 1   Crisaborole  (EUCRISA ) 2 % OINT APPLY TO AFFECTED AREAS HANDS AND FEET 1-2 TIMES A DAY FOR MAINTENANCE. 60 g 2   cyclobenzaprine (FLEXERIL) 5 MG tablet Take by mouth.     Diclofenac  Sodium (PENNSAID ) 2 % SOLN      DUPIXENT  300 MG/2ML SOAJ INJECT 1 PEN SUBCUTANEOUSLY  EVERY OTHER WEEK 4 mL 5   EPINEPHrine 0.3 mg/0.3 mL IJ SOAJ injection See admin instructions.     etodolac (LODINE) 400 MG tablet Take 400 mg by mouth.     gabapentin (NEURONTIN) 300 MG capsule Take 600 mg by mouth 3 (three) times daily. Takes differently     hydrOXYzine  (VISTARIL ) 50 MG capsule TAKE 1 CAPSULE (50 MG TOTAL) BY MOUTH 2 (TWO) TIMES DAILY AS NEEDED. 180 capsule 1   ibuprofen  (ADVIL ) 600 MG tablet Take 1 tablet (600 mg total) by mouth every 6 (six) hours as needed. 30 tablet 0   JORNAY PM 60 MG CP24 Take 1 capsule by mouth at bedtime.     losartan (COZAAR) 25 MG tablet Take 1 tablet by mouth daily.     mupirocin  ointment (BACTROBAN ) 2 % Apply 1 Application topically daily. 22 g 0   Na Sulfate-K Sulfate-Mg Sulf 17.5-3.13-1.6 GM/177ML SOLN Take 1 Bottle by mouth as directed. (Patient not taking: Reported on 08/22/2023)     Spacer/Aero-Holding Chambers (AEROCHAMBER MV) inhaler Use as instructed 1 each 1   spironolactone  (ALDACTONE ) 50 MG tablet TAKE 1-2 TABLETS BY MOUTH EVERY DAY FOR ACNE. 180 tablet 1   SUMAtriptan (IMITREX) 50 MG tablet Take by mouth.     tacrolimus  (PROTOPIC ) 0.1 % ointment Apply  topically as directed. Qd to bid to  aa hands, right foot until clear, then prn flares 100 g 2   triamcinolone  (NASACORT ) 55 MCG/ACT AERO nasal inhaler Nasal Allergy 55 mcg spray aerosol  2 SPRAYS ONCE A DAY NASALLY 30 DAYS     venlafaxine  XR (EFFEXOR -XR) 37.5 MG 24 hr capsule TAKE 1 CAPSULE (37.5 MG TOTAL) BY MOUTH DAILY WITH BREAKFAST. TAKE ALONG WITH 75 MG DAILY 90 capsule 0   venlafaxine  XR (EFFEXOR -XR) 75 MG 24 hr capsule TAKE 1 CAPSULE (75 MG TOTAL) BY MOUTH DAILY WITH BREAKFAST. TAKE ALONG WITH 37.5 MG DAILY 90 capsule 0   VITAMIN D PO Take by mouth.     zolpidem  (AMBIEN ) 5 MG tablet Take 1-1.5 tablets (5-7.5 mg total) by mouth at bedtime as needed for sleep. 45 tablet 1   No current facility-administered medications for this visit.     Musculoskeletal: Strength & Muscle Tone: UTA Gait & Station: Seated Patient leans: N/A  Psychiatric Specialty Exam: Review of Systems  Psychiatric/Behavioral: Negative.      There were no vitals taken for this visit.There is no height or weight on file to calculate BMI.  General Appearance: Casual  Eye Contact:  Fair  Speech:  Clear and Coherent  Volume:  Normal  Mood:  Euthymic  Affect:  Congruent  Thought Process:  Goal Directed and Descriptions of Associations: Intact  Orientation:  Full (Time, Place, and Person)  Thought Content: Logical   Suicidal Thoughts:  No  Homicidal Thoughts:  No  Memory:  Immediate;   Fair Recent;   Fair Remote;   Fair  Judgement:  Fair  Insight:  Fair  Psychomotor Activity:  Normal  Concentration:  Concentration: Fair and Attention Span: Fair  Recall:  [employer] of Knowledge: Fair  Language: Fair  Akathisia:  No  Handed:  Right  AIMS (if indicated): not done  Assets:  Communication Skills Desire for Improvement Housing Social Support Transportation  ADL's:  Intact  Cognition: WNL  Sleep:  Fair   Screenings: AIMS    Flowsheet Row Video Visit from 06/19/2022 in [facility]  AIMS Total Score 0   GAD-7    Flowsheet Row Office Visit from 07/25/2023 in [facility] Office Visit from 04/26/2023 in [facility] Video Visit from 06/19/2022 in [facility] Office Visit from 12/29/2021 in [facility]  Total GAD-7 Score 15 13 7 11    PHQ2-9    Flowsheet Row Video Visit from 05/27/2024 in [facility] Office Visit from 07/25/2023 in [facility] Office Visit from 04/26/2023 in [facility] Video Visit from 08/15/2022 in [facility] Video Visit from 06/19/2022 in [facility]  PHQ-2 Total Score 1 2 2  0 0  PHQ-9 Total Score 3 15 17  -- --   Flowsheet Row Video Visit from 05/27/2024 in [facility] Video Visit from 03/13/2024 in [facility] Video Visit from 01/29/2024 in [facility]  C-SSRS RISK CATEGORY No Risk No Risk No Risk     Assessment and Plan: Debrina Kizer is a 48 year old Caucasian female, employed, lives in [place], married, has a history of MDD, GAD, insomnia was evaluated by telemedicine today.  Discussed assessment and plan as noted below.   Major depression in remission Currently reports  mood symptoms as overall good on the current medication regimen.  Interested in coming off of the Rexulti . Reduce Rexulti  to 1/2 tablet and stopped taking in a week if no worsening of mood symptoms. Continue Venlafaxine  112.5 mg daily Continue psychotherapy sessions with Ms. Grayce Axe.  Insomnia/Sleep apnea-improving Currently sleep is overall okay.  Continues to take Ambien  as  needed. Continue Ambien  7.5 mg at bedtime Encourage consistent use of CPAP for obstructive sleep apnea.  Generalized anxiety disorder-stable Currently denies any significant anxiety symptoms. Continue Venlafaxine  as prescribed Continue CBT as scheduled.  ADHD-currently under the care of [place] attention specialist.  Currently on Jornay.  Follow-up Follow-up in clinic in 8 to 9 weeks or sooner in person.  Consent: Patient/Guardian gives verbal consent for treatment and assignment of benefits for services provided during this visit. Patient/Guardian expressed understanding and agreed to proceed.   This note was generated in part or whole with voice recognition software. Voice recognition is usually quite accurate but there are transcription errors that can and very often do occur. I apologize for any typographical errors that were not detected and corrected.    Sabree Nuon, MD 05/27/2024, 10:59 AM

## 2024-07-30 ENCOUNTER — Ambulatory Visit: Admitting: Psychiatry

## 2024-08-16 ENCOUNTER — Other Ambulatory Visit: Payer: Self-pay | Admitting: Psychiatry

## 2024-08-16 DIAGNOSIS — F411 Generalized anxiety disorder: Secondary | ICD-10-CM

## 2024-09-01 ENCOUNTER — Other Ambulatory Visit: Payer: Self-pay

## 2024-09-01 ENCOUNTER — Ambulatory Visit (INDEPENDENT_AMBULATORY_CARE_PROVIDER_SITE_OTHER): Admitting: Psychiatry

## 2024-09-01 ENCOUNTER — Encounter: Payer: Self-pay | Admitting: Psychiatry

## 2024-09-01 VITALS — BP 128/83 | HR 87 | Temp 97.0°F | Ht 64.0 in | Wt 226.8 lb

## 2024-09-01 DIAGNOSIS — F411 Generalized anxiety disorder: Secondary | ICD-10-CM

## 2024-09-01 DIAGNOSIS — G4701 Insomnia due to medical condition: Secondary | ICD-10-CM | POA: Insufficient documentation

## 2024-09-01 DIAGNOSIS — F321 Major depressive disorder, single episode, moderate: Secondary | ICD-10-CM | POA: Insufficient documentation

## 2024-09-01 DIAGNOSIS — F988 Other specified behavioral and emotional disorders with onset usually occurring in childhood and adolescence: Secondary | ICD-10-CM

## 2024-09-01 DIAGNOSIS — F3341 Major depressive disorder, recurrent, in partial remission: Secondary | ICD-10-CM

## 2024-09-01 NOTE — Progress Notes (Signed)
 [facility] MD OP Progress Note  09/01/2024 12:38 PM Sara Suarez  MRN:  [id number]  Chief Complaint:  Chief Complaint  Patient presents with   Follow-up   Depression   Anxiety   Medication Refill   Discussed the use of AI scribe software for clinical note transcription with the patient, who gave verbal consent to proceed.  History of Present Illness Sara Suarez is a 48 year old Caucasian female, lives in [place], has a history of GAD, MDD, insomnia, ADHD, psoriasis, atopic dermatitis was evaluated in office today.  Over the past few months, she has felt frazzled and very tired, which she reports results from significant work-related travel and long hours, including multiple 2-week trips to [place] and [place]  and recent daily commutes to [facility]. Ongoing stress related to being placed in charge of projects at work, which she finds anxiety-provoking and outside her comfort zone, continues to affect her. She notes that her anxiety worsened when she ran out of her usual dose of Jornay PM for ADHD and became under-medicated, but she reports improvement after resuming her regular 60 mg dose.  She reports increased fatigue, disrupted sleep, and difficulty maintaining a regular sleep schedule, which she attributes to jet lag, long work hours, and recent overnight work shifts. For sleep, she sometimes uses zolpidem  (Ambien ) or Benadryl, but prefers to use zolpidem  as needed to avoid next-day grogginess. She also takes hydroxyzine  as needed for anxiety and reports having an adequate supply. She notes that her sleep improved somewhat between stressful work trips when she maintained a more regular schedule.   She reports that she has developed a pattern of binge eating, especially in the evenings as a form of comfort even when not hungry.She reports discontinuing Rexulti  as previously planned and is currently off this medication. She also reports using zolpidem  as needed for sleep,  hydroxyzine  as needed for anxiety, and venlafaxine  as a maintenance antidepressant.  She denies thoughts of hurting herself or others.   She reports that additional stress at home has arisen from caring for her dog following a recent hip replacement surgery, and she notes that her husband has taken time off to assist with the dog's care. She reports that this responsibility adds to her overall stress but does not elaborate on any specific impact on her mood or functioning.    Visit Diagnosis:    ICD-10-CM   1. Generalized anxiety disorder  F41.1     2. Insomnia due to medical condition  G47.01    Mood symptoms, OSA    3. Recurrent major depressive disorder, in partial remission  F33.41     4. Attention deficit disorder (ADD) in adult  F98.8       Past Psychiatric History: I have reviewed past psychiatric history from progress note on 07/09/2019.  Past trials of Cymbalta , Klonopin, Ambien , BuSpar   Past Medical History:  Past Medical History:  Diagnosis Date   ADHD (attention deficit hyperactivity disorder)    Allergy    Anxiety    Depression     Past Surgical History:  Procedure Laterality Date   BREAST CYST ASPIRATION Right    neg   ENDOSCOPIC PLANTAR FASCIOTOMY     FOOT SURGERY     MOUTH SURGERY     TUBAL LIGATION      Family Psychiatric History: I have reviewed family psychiatric history from progress note on 07/09/2019.  Family History:  Family History  Problem Relation Age of Onset   Breast cancer Mother 49  Anxiety disorder Mother    Breast cancer Maternal Aunt 33   Anxiety disorder Sister    Panic disorder Sister    Dementia Maternal Grandmother    Dementia Paternal Grandmother     Social History: I have reviewed social history from progress note on 07/09/2019. Social History   Socioeconomic History   Marital status: Married    Spouse name: jon   Number of children: 0   Years of education: Not on file   Highest education level: Bachelor's degree  (e.g., BA, AB, BS)  Occupational History   Not on file  Tobacco Use   Smoking status: Never   Smokeless tobacco: Never  Vaping Use   Vaping status: Never Used  Substance and Sexual Activity   Alcohol use: Never   Drug use: Never   Sexual activity: Not on file  Other Topics Concern   Not on file  Social History Narrative   Not on file   Social Drivers of Health   Financial Resource Strain: Low Risk  (04/02/2024)   Received from [facility]   Overall Financial Resource Strain (CARDIA)    Difficulty of Paying Living Expenses: Not hard at all  Food Insecurity: No Food Insecurity (04/02/2024)   Received from [facility]   Hunger Vital Sign    Within the past 12 months, you worried that your food would run out before you got the money to buy more.: Never true    Within the past 12 months, the food you bought just didn't last and you didn't have money to get more.: Never true  Transportation Needs: No Transportation Needs (04/02/2024)   Received from [facility] - Transportation    In the past 12 months, has lack of transportation kept you from medical appointments or from getting medications?: No    Lack of Transportation (Non-Medical): No  Physical Activity: Inactive (07/09/2019)   Exercise Vital Sign    Days of Exercise per Week: 0 days    Minutes of Exercise per Session: 0 min  Stress: Stress Concern Present (07/09/2019)   [employer] of Occupational Health - Occupational Stress Questionnaire    Feeling of Stress : Rather much  Social Connections: Unknown (07/09/2019)   Social Connection and Isolation Panel    Frequency of Communication with Friends and Family: Not on file    Frequency of Social Gatherings with Friends and Family: Not on file    Attends Religious Services: Never    Active Member of Clubs or Organizations: Yes    Attends Banker Meetings: More than 4 times per year    Marital  Status: Married    Allergies:  Allergies  Allergen Reactions   Adbry  [Tralokinumab -Ldrm] Rash   Atomoxetine Other (See Comments)    Heartburn   Guanfacine Other (See Comments)    drowsiness   Chlorpheniramine-Pseudoeph     Hyperactive   Dupilumab  Other (See Comments)    Injection reaction but tolerable per patient   Pseudoephedrine Other (See Comments)    Hyperactive Other reaction(s): insomnia, shakey   Shellfish Allergy Other (See Comments)   Hydrocodone Itching   Prednisone  Anxiety    insomnia    Metabolic Disorder Labs: No results found for: HGBA1C, MPG No results found for: PROLACTIN Lab Results  Component Value Date   CHOL 173 02/06/2023   TRIG 94 02/06/2023   HDL 48 02/06/2023   CHOLHDL 3.6 02/06/2023   LDLCALC 108 (H) 02/06/2023  No results found for: TSH  Therapeutic Level Labs: No results found for: LITHIUM No results found for: VALPROATE No results found for: CBMZ  Current Medications: Current Outpatient Medications  Medication Sig Dispense Refill   albuterol  (VENTOLIN  HFA) 108 (90 Base) MCG/ACT inhaler Inhale 1-2 puffs into the lungs every 4 (four) hours as needed for wheezing or shortness of breath. 1 each 0   atovaquone-proguanil (MALARONE) 250-100 MG TABS tablet Take by mouth.     azelastine (ASTELIN) 0.1 % nasal spray azelastine 137 mcg (0.1 %) nasal spray aerosol     Cetirizine HCl 10 MG CAPS Take by mouth.     Cholecalciferol 125 MCG (5000 UT) capsule Take 5,000 Units by mouth.     ciclopirox  (LOPROX ) 0.77 % cream Apply to the feet and between the toes BID x 4 weeks. 90 g 0   clobetasol  (TEMOVATE ) 0.05 % external solution Mix with CeraVe cream and use as directed. Avoid applying to face, groin, and axilla. 50 mL 2   clobetasol  cream (TEMOVATE ) 0.05 % Apply to affected areas rash on hands and foot twice daily for flares until improved. Avoid face, groin, underarms. 60 g 1   Crisaborole  (EUCRISA ) 2 % OINT APPLY TO AFFECTED AREAS  HANDS AND FEET 1-2 TIMES A DAY FOR MAINTENANCE. 60 g 2   cyclobenzaprine (FLEXERIL) 5 MG tablet Take by mouth.     Diclofenac  Sodium (PENNSAID ) 2 % SOLN      DUPIXENT  300 MG/2ML SOAJ INJECT 1 PEN SUBCUTANEOUSLY  EVERY OTHER WEEK 4 mL 5   EPINEPHrine 0.3 mg/0.3 mL IJ SOAJ injection See admin instructions.     etodolac (LODINE) 400 MG tablet Take 400 mg by mouth.     gabapentin (NEURONTIN) 300 MG capsule Take 600 mg by mouth 3 (three) times daily. Takes differently     hydrOXYzine  (VISTARIL ) 50 MG capsule TAKE 1 CAPSULE (50 MG TOTAL) BY MOUTH 2 (TWO) TIMES DAILY AS NEEDED. 180 capsule 1   ibuprofen  (ADVIL ) 600 MG tablet Take 1 tablet (600 mg total) by mouth every 6 (six) hours as needed. 30 tablet 0   JORNAY PM 60 MG CP24 Take 1 capsule by mouth at bedtime.     losartan (COZAAR) 25 MG tablet Take 1 tablet by mouth daily.     mupirocin  ointment (BACTROBAN ) 2 % Apply 1 Application topically daily. 22 g 0   Na Sulfate-K Sulfate-Mg Sulf 17.5-3.13-1.6 GM/177ML SOLN Take 1 Bottle by mouth as directed. (Patient not taking: Reported on 08/22/2023)     Spacer/Aero-Holding Chambers (AEROCHAMBER MV) inhaler Use as instructed 1 each 1   spironolactone  (ALDACTONE ) 50 MG tablet TAKE 1-2 TABLETS BY MOUTH EVERY DAY FOR ACNE. 180 tablet 1   SUMAtriptan (IMITREX) 50 MG tablet Take by mouth.     tacrolimus  (PROTOPIC ) 0.1 % ointment Apply topically as directed. Qd to bid to aa hands, right foot until clear, then prn flares 100 g 2   triamcinolone  (NASACORT ) 55 MCG/ACT AERO nasal inhaler Nasal Allergy 55 mcg spray aerosol  2 SPRAYS ONCE A DAY NASALLY 30 DAYS     venlafaxine  XR (EFFEXOR -XR) 37.5 MG 24 hr capsule TAKE 1 CAPSULE (37.5 MG TOTAL) BY MOUTH DAILY WITH BREAKFAST. TAKE ALONG WITH 75 MG DAILY 90 capsule 1   venlafaxine  XR (EFFEXOR -XR) 75 MG 24 hr capsule TAKE 1 CAPSULE (75 MG TOTAL) BY MOUTH DAILY WITH BREAKFAST. TAKE ALONG WITH 37.5 MG DAILY 90 capsule 1   VITAMIN D PO Take by mouth.  zolpidem  (AMBIEN ) 5 MG  tablet Take 1-1.5 tablets (5-7.5 mg total) by mouth at bedtime as needed for sleep. 45 tablet 1   No current facility-administered medications for this visit.     Musculoskeletal: Strength & Muscle Tone: within normal limits Gait & Station: normal Patient leans: N/A  Psychiatric Specialty Exam: Review of Systems  Psychiatric/Behavioral:  Positive for sleep disturbance. The patient is nervous/anxious.     Blood pressure 128/83, pulse 87, temperature (!) 97 F (36.1 C), temperature source Temporal, height 5' 4 (1.626 m), weight 226 lb 12.8 oz (102.9 kg).Body mass index is 38.93 kg/m.  General Appearance: Casual  Eye Contact:  Fair  Speech:  Clear and Coherent  Volume:  Normal  Mood:  Anxious  Affect:  Appropriate  Thought Process:  Goal Directed and Descriptions of Associations: Intact  Orientation:  Full (Time, Place, and Person)  Thought Content: Logical   Suicidal Thoughts:  No  Homicidal Thoughts:  No  Memory:  Immediate;   Fair Recent;   Fair Remote;   Fair  Judgement:  Fair  Insight:  Fair  Psychomotor Activity:  Normal  Concentration:  Concentration: Fair and Attention Span: Fair  Recall:  [employer] of Knowledge: Fair  Language: Fair  Akathisia:  No  Handed:  Right  AIMS (if indicated): not done  Assets:  Communication Skills Desire for Improvement Housing Social Support Transportation  ADL's:  Intact  Cognition: WNL  Sleep:  Varies    Screenings: AIMS    Flowsheet Row Video Visit from 06/19/2022 in [facility]  AIMS Total Score 0   GAD-7    Flowsheet Row Office Visit from 09/01/2024 in [facility] Office Visit from 07/25/2023 in [facility] Office Visit from 04/26/2023 in [facility] Video Visit from 06/19/2022 in [facility] Office Visit from 12/29/2021  in [facility]  Total GAD-7 Score 13 15 13 7 11    PHQ2-9    Flowsheet Row Office Visit from 09/01/2024 in [facility] Video Visit from 05/27/2024 in [facility] Office Visit from 07/25/2023 in [facility] Office Visit from 04/26/2023 in [facility] Video Visit from 08/15/2022 in [facility]  PHQ-2 Total Score 2 1 2 2  0  PHQ-9 Total Score 14 3 15 17  --   Flowsheet Row Office Visit from 09/01/2024 in [facility] Video Visit from 05/27/2024 in [facility] Video Visit from 03/13/2024 in [facility]  C-SSRS RISK CATEGORY No Risk No Risk No Risk     Assessment and Plan: Judyann Casasola is a 48 year old Caucasian female, employed, lives in [place], was evaluated in office today.  Discussed assessment and plan as noted below.  1. Generalized anxiety disorder-unstable Currently does have ongoing situational anxiety although not interested in further medication changes. Encouraged to continue CBT as needed Previously followed up with Ms. Grayce Axe. Continue Venlafaxine  112.5 mg daily  2. Insomnia due to medical condition-unstable Sleep problems due to work schedule and recent travel. Encouraged to work on sleep hygiene.  Continue Ambien  7.5 mg at bedtime Reviewed [place] PMP AWARxE Encouraged to make use of CPAP for obstructive sleep apnea  3. Recurrent major depressive disorder, in partial remission Ongoing symptoms  of depression include sleep, focus problem and low energy and changes in appetite due to current situational stressors and travel for work. Encouraged to work on lifestyle modification, sleep hygiene Continue Venlafaxine   112.5 mg daily Continue CBT  4. Attention deficit disorder (ADD) in adult-improving Recently went back on Jornay 60 mg which has been beneficial.  Continue follow-up with [place] attention specialist.  Follow-up Follow-up in clinic in 3 months or sooner if needed.  Collaboration of Care: Collaboration of Care: Referral or follow-up with counselor/therapist AEB encouraged to continue CBT  Patient/Guardian was advised Release of Information must be obtained prior to any record release in order to collaborate their care with an outside provider. Patient/Guardian was advised if they have not already done so to contact the registration department to sign all necessary forms in order for us  to release information regarding their care.   Consent: Patient/Guardian gives verbal consent for treatment and assignment of benefits for services provided during this visit. Patient/Guardian expressed understanding and agreed to proceed.   This note was generated in part or whole with voice recognition software. Voice recognition is usually quite accurate but there are transcription errors that can and very often do occur. I apologize for any typographical errors that were not detected and corrected.    Kenndra Morris, MD 09/01/2024, 2:11 PM

## 2024-09-24 ENCOUNTER — Other Ambulatory Visit: Payer: Self-pay | Admitting: Dermatology

## 2024-09-24 DIAGNOSIS — L700 Acne vulgaris: Secondary | ICD-10-CM

## 2024-09-28 ENCOUNTER — Other Ambulatory Visit: Payer: Self-pay | Admitting: Dermatology

## 2024-10-01 DIAGNOSIS — F3341 Major depressive disorder, recurrent, in partial remission: Secondary | ICD-10-CM

## 2024-10-02 MED ORDER — BREXPIPRAZOLE 0.25 MG PO TABS: 0.2500 mg | ORAL_TABLET | Freq: Every day | ORAL | 1 refills | Status: DC

## 2024-12-01 ENCOUNTER — Telehealth: Admitting: Psychiatry

## 2024-12-01 ENCOUNTER — Encounter: Payer: Self-pay | Admitting: Psychiatry

## 2024-12-01 DIAGNOSIS — G4701 Insomnia due to medical condition: Secondary | ICD-10-CM

## 2024-12-01 DIAGNOSIS — F988 Other specified behavioral and emotional disorders with onset usually occurring in childhood and adolescence: Secondary | ICD-10-CM | POA: Diagnosis not present

## 2024-12-01 DIAGNOSIS — F3342 Major depressive disorder, recurrent, in full remission: Secondary | ICD-10-CM | POA: Diagnosis not present

## 2024-12-01 DIAGNOSIS — F411 Generalized anxiety disorder: Secondary | ICD-10-CM | POA: Diagnosis not present

## 2024-12-01 MED ORDER — BREXPIPRAZOLE 0.25 MG PO TABS: 0.2500 mg | ORAL_TABLET | Freq: Every day | ORAL | 0 refills | Status: AC

## 2024-12-01 NOTE — Progress Notes (Unsigned)
 Virtual Visit via Video Note  I connected with Sara Suarez on 12/01/24 at  1:00 PM EST by a video enabled telemedicine application and verified that I am speaking with the correct person using two identifiers.  Location Provider Location : [facility] Patient Location : Home  Participants: Patient , Provider   I discussed the limitations of evaluation and management by telemedicine and the availability of in person appointments. The patient expressed understanding and agreed to proceed.    I discussed the assessment and treatment plan with the patient. The patient was provided an opportunity to ask questions and all were answered. The patient agreed with the plan and demonstrated an understanding of the instructions.   The patient was advised to call back or seek an in-person evaluation if the symptoms worsen or if the condition fails to improve as anticipated.   BH MD OP Progress Note  12/01/2024 1:25 PM Sara Suarez  MRN:  [id number]  Chief Complaint:  Chief Complaint  Patient presents with   Follow-up   Anxiety   Depression   Medication Refill   Discussed the use of AI scribe software for clinical note transcription with the patient, who gave verbal consent to proceed.  History of Present Illness Sara Suarez is a 49 year old Caucasian female lives in [place], has a history of GAD, MDD, insomnia, ADHD, psoriasis, atopic dermatitis was evaluated by telemedicine today for a follow-up appointment.  She reports improvement in mood and overall functioning since starting Rexulti , describing herself as more functional and less likely to spend time lying around unable to do things. She states that she has not experienced significant sadness or hopelessness in the past couple of weeks and notes that her mood is better than it was a few months ago. She denies any lack of interest or pleasure in activities except when her pain is severe. She denies suicidal or homicidal  ideation.  Her current medication regimen includes Rexulti  and venlafaxine .  She denies any side effects.  She also describes a recent adjustment in her Jornay dose, stating that she previously tried 80 mg but did not notice a difference, so she returned to 60 mg. She plans to try 80 mg again for a month to see if it makes a difference, as her work situation is currently more stable and she is not traveling for work.  Over the past few weeks, she reports improvement in sleep, going to bed earlier and sleeping better. She continues to use her CPAP at night. Her daily routine involves working from home, which allows her a more flexible schedule and less need to wake up early.  She is planning to go on birding trips including upcoming travel to [place].  She looks forward to the same.     Visit Diagnosis:    ICD-10-CM   1. Generalized anxiety disorder  F41.1     2. Insomnia due to medical condition  G47.01    Mood symptoms, OSA    3. Recurrent major depressive disorder, in full remission  F33.42 brexpiprazole  (REXULTI ) 0.25 MG TABS tablet    4. Attention deficit disorder (ADD) in adult  F98.8       Past Psychiatric History: I have reviewed past psychiatric history from progress note on 07/09/2019.  Past trials of Cymbalta , Ambien , BuSpar   Past Medical History:  Past Medical History:  Diagnosis Date   ADHD (attention deficit hyperactivity disorder)    Allergy    Anxiety    Depression  Past Surgical History:  Procedure Laterality Date   BREAST CYST ASPIRATION Right    neg   ENDOSCOPIC PLANTAR FASCIOTOMY     FOOT SURGERY     MOUTH SURGERY     TUBAL LIGATION      Family Psychiatric History: Reviewed family psychiatric history from progress note on 07/09/2019.  Family History:  Family History  Problem Relation Age of Onset   Breast cancer Mother 12   Anxiety disorder Mother    Breast cancer Maternal Aunt 50   Anxiety disorder Sister    Panic disorder Sister    Dementia  Maternal Grandmother    Dementia Paternal Grandmother     Social History: I have reviewed social history from progress note on 07/09/2019. Social History   Socioeconomic History   Marital status: Married    Spouse name: jon   Number of children: 0   Years of education: Not on file   Highest education level: Bachelor's degree (e.g., BA, AB, BS)  Occupational History   Not on file  Tobacco Use   Smoking status: Never   Smokeless tobacco: Never  Vaping Use   Vaping status: Never Used  Substance and Sexual Activity   Alcohol use: Never   Drug use: Never   Sexual activity: Not on file  Other Topics Concern   Not on file  Social History Narrative   Not on file   Social Drivers of Health   Tobacco Use: Low Risk (12/01/2024)   Patient History    Smoking Tobacco Use: Never    Smokeless Tobacco Use: Never    Passive Exposure: Not on file  Financial Resource Strain: Low Risk  (04/02/2024)   Received from [facility]   Overall Financial Resource Strain (CARDIA)    Difficulty of Paying Living Expenses: Not hard at all  Food Insecurity: No Food Insecurity (04/02/2024)   Received from [facility]   Epic    Within the past 12 months, you worried that your food would run out before you got the money to buy more.: Never true    Within the past 12 months, the food you bought just didn't last and you didn't have money to get more.: Never true  Transportation Needs: No Transportation Needs (04/02/2024)   Received from [facility] - Transportation    In the past 12 months, has lack of transportation kept you from medical appointments or from getting medications?: No    Lack of Transportation (Non-Medical): No  Physical Activity: Not on file  Stress: Not on file  Social Connections: Not on file  Depression (PHQ2-9): Low Risk (12/01/2024)   Depression (PHQ2-9)    PHQ-2 Score: 1  Alcohol Screen: Not on file  Housing: Low Risk   (04/02/2024)   Received from [facility]   Epic    In the last 12 months, was there a time when you were not able to pay the mortgage or rent on time?: No    In the past 12 months, how many times have you moved where you were living?: 0    At any time in the past 12 months, were you homeless or living in a shelter (including now)?: No  Utilities: Not At Risk (04/02/2024)   Received from [facility] Utilities    Threatened with loss of utilities: No  Health Literacy: Not on file    Allergies: Allergies[1]  Metabolic Disorder Labs: No  results found for: HGBA1C, MPG No results found for: PROLACTIN Lab Results  Component Value Date   CHOL 173 02/06/2023   TRIG 94 02/06/2023   HDL 48 02/06/2023   CHOLHDL 3.6 02/06/2023   LDLCALC 108 (H) 02/06/2023   No results found for: TSH  Therapeutic Level Labs: No results found for: LITHIUM No results found for: VALPROATE No results found for: CBMZ  Current Medications: Current Outpatient Medications  Medication Sig Dispense Refill   JORNAY PM 60 MG CP24 Take 1 capsule by mouth at bedtime. (Patient taking differently: Take 1 capsule by mouth at bedtime. Takes 80 mg daily)     albuterol  (VENTOLIN  HFA) 108 (90 Base) MCG/ACT inhaler Inhale 1-2 puffs into the lungs every 4 (four) hours as needed for wheezing or shortness of breath. 1 each 0   atovaquone-proguanil (MALARONE) 250-100 MG TABS tablet Take by mouth.     azelastine (ASTELIN) 0.1 % nasal spray azelastine 137 mcg (0.1 %) nasal spray aerosol     brexpiprazole  (REXULTI ) 0.25 MG TABS tablet Take 1 tablet (0.25 mg total) by mouth daily. 90 tablet 0   Cetirizine HCl 10 MG CAPS Take by mouth.     Cholecalciferol 125 MCG (5000 UT) capsule Take 5,000 Units by mouth.     ciclopirox  (LOPROX ) 0.77 % cream Apply to the feet and between the toes BID x 4 weeks. 90 g 0   clobetasol  (TEMOVATE ) 0.05 % external solution Mix with CeraVe cream and use  as directed. Avoid applying to face, groin, and axilla. 50 mL 2   clobetasol  cream (TEMOVATE ) 0.05 % Apply to affected areas rash on hands and foot twice daily for flares until improved. Avoid face, groin, underarms. 60 g 1   Crisaborole  (EUCRISA ) 2 % OINT APPLY TO AFFECTED AREAS HANDS AND FEET 1-2 TIMES A DAY FOR MAINTENANCE. 60 g 2   cyclobenzaprine (FLEXERIL) 5 MG tablet Take by mouth.     Diclofenac  Sodium (PENNSAID ) 2 % SOLN      DUPIXENT  300 MG/2ML SOAJ INJECT 1 PEN SUBCUTANEOUSLY  EVERY OTHER WEEK 4 mL 3   EPINEPHrine 0.3 mg/0.3 mL IJ SOAJ injection See admin instructions.     etodolac (LODINE) 400 MG tablet Take 400 mg by mouth.     gabapentin (NEURONTIN) 300 MG capsule Take 600 mg by mouth 3 (three) times daily. Takes differently     hydrOXYzine  (VISTARIL ) 50 MG capsule TAKE 1 CAPSULE (50 MG TOTAL) BY MOUTH 2 (TWO) TIMES DAILY AS NEEDED. 180 capsule 1   ibuprofen  (ADVIL ) 600 MG tablet Take 1 tablet (600 mg total) by mouth every 6 (six) hours as needed. 30 tablet 0   losartan (COZAAR) 25 MG tablet Take 1 tablet by mouth daily.     mupirocin  ointment (BACTROBAN ) 2 % Apply 1 Application topically daily. 22 g 0   Na Sulfate-K Sulfate-Mg Sulf 17.5-3.13-1.6 GM/177ML SOLN Take 1 Bottle by mouth as directed. (Patient not taking: Reported on 08/22/2023)     Spacer/Aero-Holding Chambers (AEROCHAMBER MV) inhaler Use as instructed 1 each 1   spironolactone  (ALDACTONE ) 50 MG tablet TAKE 1-2 TABLETS BY MOUTH EVERY DAY FOR ACNE. 180 tablet 0   SUMAtriptan (IMITREX) 50 MG tablet Take by mouth.     tacrolimus  (PROTOPIC ) 0.1 % ointment Apply topically as directed. Qd to bid to aa hands, right foot until clear, then prn flares 100 g 2   triamcinolone  (NASACORT ) 55 MCG/ACT AERO nasal inhaler Nasal Allergy 55 mcg spray aerosol  2 SPRAYS ONCE A  DAY NASALLY 30 DAYS     venlafaxine  XR (EFFEXOR -XR) 37.5 MG 24 hr capsule TAKE 1 CAPSULE (37.5 MG TOTAL) BY MOUTH DAILY WITH BREAKFAST. TAKE ALONG WITH 75 MG DAILY 90  capsule 1   venlafaxine  XR (EFFEXOR -XR) 75 MG 24 hr capsule TAKE 1 CAPSULE (75 MG TOTAL) BY MOUTH DAILY WITH BREAKFAST. TAKE ALONG WITH 37.5 MG DAILY 90 capsule 1   VITAMIN D PO Take by mouth.     zolpidem  (AMBIEN ) 5 MG tablet Take 1-1.5 tablets (5-7.5 mg total) by mouth at bedtime as needed for sleep. 45 tablet 1   No current facility-administered medications for this visit.     Musculoskeletal: Strength & Muscle Tone: UTA Gait & Station: Seated Patient leans: N/A  Psychiatric Specialty Exam: Review of Systems  Psychiatric/Behavioral:  The patient is nervous/anxious (improving).     There were no vitals taken for this visit.There is no height or weight on file to calculate BMI.  General Appearance: Casual  Eye Contact:  Fair  Speech:  Clear and Coherent  Volume:  Normal  Mood:  Anxious  Affect:  Congruent  Thought Process:  Goal Directed and Descriptions of Associations: Intact  Orientation:  Full (Time, Place, and Person)  Thought Content: Logical   Suicidal Thoughts:  No  Homicidal Thoughts:  No  Memory:  Immediate;   Fair Recent;   Fair Remote;   Fair  Judgement:  Fair  Insight:  Fair  Psychomotor Activity:  Normal  Concentration:  Concentration: Fair and Attention Span: Fair  Recall:  [employer] of Knowledge: Fair  Language: Fair  Akathisia:  No  Handed:  Right  AIMS (if indicated): not done  Assets:  Communication Skills Desire for Improvement Housing Social Support Transportation  ADL's:  Intact  Cognition: WNL  Sleep:  Fair   Screenings: AIMS    Flowsheet Row Video Visit from 06/19/2022 in [facility]  AIMS Total Score 0   GAD-7    Flowsheet Row Office Visit from 09/01/2024 in [facility] Office Visit from 07/25/2023 in [facility] Office Visit from 04/26/2023 in [facility] Video Visit from  06/19/2022 in [facility] Office Visit from 12/29/2021 in [facility]  Total GAD-7 Score 13 15 13 7 11    PHQ2-9    Flowsheet Row Video Visit from 12/01/2024 in [facility] Office Visit from 09/01/2024 in [facility] Video Visit from 05/27/2024 in [facility] Office Visit from 07/25/2023 in [facility] Office Visit from 04/26/2023 in [facility]  PHQ-2 Total Score 1 2 1 2 2   PHQ-9 Total Score -- 14 3 15 17    Flowsheet Row Video Visit from 12/01/2024 in [facility] Office Visit from 09/01/2024 in [facility] Video Visit from 05/27/2024 in [facility]  C-SSRS RISK CATEGORY No Risk No Risk No Risk     Assessment and Plan: Sara Suarez is a 49 year old Caucasian female presented for a follow-up appointment, discussed assessment and plan as noted below.  1. Generalized anxiety disorder-improving Currently reports overall improvement in her mood symptoms Continue Venlafaxine  112.5 mg daily Continue psychotherapy sessions as needed  2. Insomnia due to medical condition-improving Overall improvement in sleep. Continue sleep  hygiene techniques Continue CPAP for obstructive sleep apnea Continue Ambien  7.5 mg at bedtime  3. Recurrent major depressive disorder, in full remission Currently reports depression has improved on the Rexulti  Continue Rexulti  0.25 mg daily Continue Venlafaxine  as prescribed. Continue CBT  4. Attention deficit disorder (ADD) in adult-improving Currently following up with [place] attention specialist.  On Jornay 80 mg daily   Follow-up Follow-up in clinic in 3 months  or sooner if needed.  Consent: Patient/Guardian gives verbal consent for treatment and assignment of benefits for services provided during this visit. Patient/Guardian expressed understanding and agreed to proceed.   This note was generated in part or whole with voice recognition software. Voice recognition is usually quite accurate but there are transcription errors that can and very often do occur. I apologize for any typographical errors that were not detected and corrected.    Itamar Mcgowan, MD 12/02/2024, 5:48 PM     [1]  Allergies Allergen Reactions   Adbry  [Tralokinumab -Ldrm] Rash   Atomoxetine Other (See Comments)    Heartburn   Guanfacine Other (See Comments)    drowsiness   Chlorpheniramine-Pseudoeph     Hyperactive   Dupilumab  Other (See Comments)    Injection reaction but tolerable per patient   Pseudoephedrine Other (See Comments)    Hyperactive Other reaction(s): insomnia, shakey   Shellfish Allergy Other (See Comments)   Hydrocodone Itching   Prednisone  Anxiety    insomnia

## 2024-12-08 ENCOUNTER — Encounter: Payer: Self-pay | Admitting: Dermatology

## 2025-02-25 ENCOUNTER — Telehealth: Admitting: Psychiatry

## 2026-02-09 ENCOUNTER — Ambulatory Visit: Admitting: Dermatology
